# Patient Record
Sex: Male | Born: 1935 | Race: White | Hispanic: No | Marital: Single | Smoking: Former smoker
Health system: Southern US, Community
[De-identification: ages and names within clinical notes are randomized; demographics above are authoritative.]

## PROBLEM LIST (undated history)

## (undated) DIAGNOSIS — E039 Hypothyroidism, unspecified: Secondary | ICD-10-CM

## (undated) DIAGNOSIS — I2510 Atherosclerotic heart disease of native coronary artery without angina pectoris: Secondary | ICD-10-CM

## (undated) DIAGNOSIS — I10 Essential (primary) hypertension: Secondary | ICD-10-CM

## (undated) DIAGNOSIS — IMO0002 Reserved for concepts with insufficient information to code with codable children: Secondary | ICD-10-CM

## (undated) DIAGNOSIS — I4891 Unspecified atrial fibrillation: Secondary | ICD-10-CM

## (undated) DIAGNOSIS — Z955 Presence of coronary angioplasty implant and graft: Secondary | ICD-10-CM

## (undated) DIAGNOSIS — I495 Sick sinus syndrome: Secondary | ICD-10-CM

## (undated) DIAGNOSIS — E785 Hyperlipidemia, unspecified: Secondary | ICD-10-CM

## (undated) HISTORY — DX: Unspecified atrial fibrillation: I48.91

## (undated) HISTORY — DX: Sick sinus syndrome: I49.5

## (undated) HISTORY — DX: Hyperlipidemia, unspecified: E78.5

---

## 1996-09-10 DIAGNOSIS — Z955 Presence of coronary angioplasty implant and graft: Secondary | ICD-10-CM

## 1996-09-10 HISTORY — DX: Presence of coronary angioplasty implant and graft: Z95.5

## 1996-12-30 HISTORY — PX: CORONARY ANGIOPLASTY WITH STENT PLACEMENT: SHX49

## 1997-12-13 ENCOUNTER — Ambulatory Visit (HOSPITAL_COMMUNITY): Admission: RE | Admit: 1997-12-13 | Discharge: 1997-12-13 | Payer: Self-pay | Admitting: Cardiology

## 1998-02-16 ENCOUNTER — Ambulatory Visit (HOSPITAL_COMMUNITY): Admission: RE | Admit: 1998-02-16 | Discharge: 1998-02-16 | Payer: Self-pay | Admitting: Cardiology

## 2000-03-06 ENCOUNTER — Encounter: Payer: Self-pay | Admitting: Cardiology

## 2000-03-06 ENCOUNTER — Encounter: Admission: RE | Admit: 2000-03-06 | Discharge: 2000-03-06 | Payer: Self-pay | Admitting: Cardiology

## 2000-03-07 ENCOUNTER — Ambulatory Visit (HOSPITAL_COMMUNITY): Admission: RE | Admit: 2000-03-07 | Discharge: 2000-03-08 | Payer: Self-pay | Admitting: Cardiology

## 2000-03-07 HISTORY — PX: CARDIAC CATHETERIZATION: SHX172

## 2000-04-11 ENCOUNTER — Ambulatory Visit (HOSPITAL_COMMUNITY): Admission: RE | Admit: 2000-04-11 | Discharge: 2000-04-11 | Payer: Self-pay | Admitting: General Surgery

## 2000-07-24 ENCOUNTER — Encounter: Payer: Self-pay | Admitting: General Surgery

## 2000-07-24 ENCOUNTER — Encounter: Admission: RE | Admit: 2000-07-24 | Discharge: 2000-07-24 | Payer: Self-pay | Admitting: General Surgery

## 2003-10-26 ENCOUNTER — Encounter: Admission: RE | Admit: 2003-10-26 | Discharge: 2003-10-26 | Payer: Self-pay | Admitting: Cardiology

## 2007-03-04 HISTORY — PX: NM MYOVIEW LTD: HXRAD82

## 2007-03-26 ENCOUNTER — Ambulatory Visit (HOSPITAL_COMMUNITY): Admission: RE | Admit: 2007-03-26 | Discharge: 2007-03-27 | Payer: Self-pay | Admitting: Cardiology

## 2007-03-26 HISTORY — PX: CARDIAC CATHETERIZATION: SHX172

## 2009-09-10 HISTORY — PX: CATARACT EXTRACTION, BILATERAL: SHX1313

## 2009-11-10 ENCOUNTER — Emergency Department (HOSPITAL_COMMUNITY)
Admission: EM | Admit: 2009-11-10 | Discharge: 2009-11-10 | Payer: Self-pay | Source: Home / Self Care | Admitting: Emergency Medicine

## 2009-11-14 ENCOUNTER — Telehealth: Payer: Self-pay | Admitting: Physician Assistant

## 2010-09-18 ENCOUNTER — Emergency Department (HOSPITAL_COMMUNITY)
Admission: EM | Admit: 2010-09-18 | Discharge: 2010-09-18 | Payer: Self-pay | Source: Home / Self Care | Admitting: Emergency Medicine

## 2010-09-25 LAB — COMPREHENSIVE METABOLIC PANEL
ALT: 15 U/L (ref 0–53)
AST: 25 U/L (ref 0–37)
Albumin: 3.1 g/dL — ABNORMAL LOW (ref 3.5–5.2)
Alkaline Phosphatase: 66 U/L (ref 39–117)
BUN: 14 mg/dL (ref 6–23)
CO2: 28 mEq/L (ref 19–32)
Calcium: 9.3 mg/dL (ref 8.4–10.5)
Chloride: 98 mEq/L (ref 96–112)
Creatinine, Ser: 1.04 mg/dL (ref 0.4–1.5)
GFR calc Af Amer: >60 mL/min (ref >60)
GFR calc non Af Amer: >60 mL/min (ref >60)
Glucose, Bld: 95 mg/dL (ref 70–99)
Potassium: 4.2 mEq/L (ref 3.5–5.1)
Sodium: 135 mEq/L (ref 135–145)
Total Bilirubin: 0.5 mg/dL (ref 0.3–1.2)
Total Protein: 6.3 g/dL (ref 6.0–8.3)

## 2010-09-25 LAB — DIFFERENTIAL
Basophils Absolute: 0 10*3/uL (ref 0.0–0.1)
Basophils Relative: 0 % (ref 0–1)
Eosinophils Absolute: 0.1 10*3/uL (ref 0.0–0.7)
Eosinophils Relative: 1 % (ref 0–5)
Lymphocytes Relative: 12 % (ref 12–46)
Lymphs Abs: 1 10*3/uL (ref 0.7–4.0)
Monocytes Absolute: 0.6 10*3/uL (ref 0.1–1.0)
Monocytes Relative: 7 % (ref 3–12)
Neutro Abs: 6.6 10*3/uL (ref 1.7–7.7)
Neutrophils Relative %: 80 % — ABNORMAL HIGH (ref 43–77)

## 2010-09-25 LAB — CBC
HCT: 33.6 % — ABNORMAL LOW (ref 39.0–52.0)
Hemoglobin: 11 g/dL — ABNORMAL LOW (ref 13.0–17.0)
MCH: 30.8 pg (ref 26.0–34.0)
MCHC: 32.7 g/dL (ref 30.0–36.0)
MCV: 94.1 fL (ref 78.0–100.0)
Platelets: 436 10*3/uL — ABNORMAL HIGH (ref 150–400)
RBC: 3.57 MIL/uL — ABNORMAL LOW (ref 4.22–5.81)
RDW: 14.2 % (ref 11.5–15.5)
WBC: 8.3 10*3/uL (ref 4.0–10.5)

## 2010-09-25 LAB — GLUCOSE, CAPILLARY: Glucose-Capillary: 82 mg/dL (ref 70–99)

## 2010-09-25 LAB — OCCULT BLOOD, POC DEVICE: Fecal Occult Bld: POSITIVE

## 2010-09-25 LAB — LIPASE, BLOOD: Lipase: 42 U/L (ref 11–59)

## 2010-09-25 LAB — SAMPLE TO BLOOD BANK

## 2010-09-25 LAB — PROTIME-INR
INR: 1.01 (ref 0.00–1.49)
Prothrombin Time: 13.5 seconds (ref 11.6–15.2)

## 2010-09-25 LAB — LACTIC ACID, PLASMA: Lactic Acid, Venous: 0.7 mmol/L (ref 0.5–2.2)

## 2010-09-25 LAB — APTT: aPTT: 27 seconds (ref 24–37)

## 2010-10-10 NOTE — Progress Notes (Signed)
  Phone Note Outgoing Call   Call placed by: Joselyn Glassman,  November 14, 2009 12:23 PM Call placed to: Patient Summary of Call: I called the pt to ask why he didn't come to his appt this AM with our PA.  He said he went to [facility] hospital last week and "they fixed me up".  He said he had a touch of the flu.  I advised we got records from Dr. Lanell Matar office including a copy of the CT scan and Dr. Jacky Kindle thought he should see [place].  He said, " I  had multipled appts and a CT scan and a hospital stay and I am not spending any more damn money".  He thanked me for calling him and hung up.  Initial call taken by: Joselyn Glassman,  November 14, 2009 12:26 PM

## 2010-12-01 LAB — DIFFERENTIAL
Basophils Absolute: 0 10*3/uL (ref 0.0–0.1)
Basophils Relative: 0 % (ref 0–1)
Eosinophils Absolute: 0 10*3/uL (ref 0.0–0.7)
Eosinophils Relative: 0 % (ref 0–5)
Lymphocytes Relative: 5 % — ABNORMAL LOW (ref 12–46)
Lymphs Abs: 0.5 10*3/uL — ABNORMAL LOW (ref 0.7–4.0)
Monocytes Absolute: 0.6 10*3/uL (ref 0.1–1.0)
Monocytes Relative: 6 % (ref 3–12)
Neutro Abs: 8.7 10*3/uL — ABNORMAL HIGH (ref 1.7–7.7)
Neutrophils Relative %: 89 % — ABNORMAL HIGH (ref 43–77)

## 2010-12-01 LAB — COMPREHENSIVE METABOLIC PANEL
ALT: 13 U/L (ref 0–53)
AST: 22 U/L (ref 0–37)
Albumin: 3.7 g/dL (ref 3.5–5.2)
Alkaline Phosphatase: 65 U/L (ref 39–117)
BUN: 35 mg/dL — ABNORMAL HIGH (ref 6–23)
CO2: 30 mEq/L (ref 19–32)
Calcium: 9.4 mg/dL (ref 8.4–10.5)
Chloride: 96 mEq/L (ref 96–112)
Creatinine, Ser: 1.09 mg/dL (ref 0.4–1.5)
GFR calc Af Amer: >60 mL/min (ref >60)
GFR calc non Af Amer: >60 mL/min (ref >60)
Glucose, Bld: 118 mg/dL — ABNORMAL HIGH (ref 70–99)
Potassium: 4 mEq/L (ref 3.5–5.1)
Sodium: 143 mEq/L (ref 135–145)
Total Bilirubin: 0.6 mg/dL (ref 0.3–1.2)
Total Protein: 6.8 g/dL (ref 6.0–8.3)

## 2010-12-01 LAB — CBC
HCT: 34 % — ABNORMAL LOW (ref 39.0–52.0)
Hemoglobin: 11.5 g/dL — ABNORMAL LOW (ref 13.0–17.0)
MCHC: 33.7 g/dL (ref 30.0–36.0)
MCV: 96.2 fL (ref 78.0–100.0)
Platelets: 339 10*3/uL (ref 150–400)
RBC: 3.53 MIL/uL — ABNORMAL LOW (ref 4.22–5.81)
RDW: 13.9 % (ref 11.5–15.5)
WBC: 9.8 10*3/uL (ref 4.0–10.5)

## 2010-12-01 LAB — LIPASE, BLOOD: Lipase: 37 U/L (ref 11–59)

## 2011-01-23 NOTE — Discharge Summary (Signed)
NAME:  FIELDS, OROS NO.:  [id number]   MEDICAL RECORD NO.:  [id number]          PATIENT TYPE:  OIB   LOCATION:  4703                         FACILITY:  [facility]   PHYSICIAN:  Thereasa Solo. Little, M.D. DATE OF BIRTH:  19-Mar-1921   DATE OF ADMISSION:  03/26/2007  DATE OF DISCHARGE:  03/27/2007                               DISCHARGE SUMMARY   DISCHARGE DIAGNOSES:  1. Coronary disease, plan is for medical therapy after catheterization      this admission.  2. Abnormal Cardiolite study March 04, 2007.  3. Treated dyslipidemia.  4. Treated hypertension.   HOSPITAL COURSE:  The patient is an 77 year old male who has known  coronary disease.  He had a stent to his RCA in 1998.  He had a  Cardiolite study recently as an outpatient that showed mild to moderate  inferolateral ischemia.  He did have an episode of syncope prior to  admission.  Dr. Clarene Duke saw him in the office March 12, 2007, and set him  up for a cardiac catheterization.  Catheterization was done March 26, 2007.  This revealed normal left main, total circumflex with left-to-  left collaterals.  The OM-2 and OM three, 50% LAD and patent stent in  the RCA with normal LV function.  There was some calcification in the  distal aorta with no aneurysm.  The patient was kept overnight for  observation. He does live alone.  Dr. Clarene Duke discussed options with the  patient.  Dr. Clarene Duke did not feel that there was anything that could be  safely treated with a PCI, and the patient did not want bypass surgery.  For now, we will continue his current medications.  Dr. 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   DISCHARGE MEDICATIONS:  1. Lipitor 40 mg a day.  2. Zetia 10 mg a day.  3. Metoprolol 25 mg a day.  4. Enalapril 2.5 mg a day.  5. Aspirin 325 mg a day.  6. Nitroglycerin sublingual p.r.n.   LABORATORY DATA:  White count 6.6, hemoglobin 13, hematocrit  38.8,  platelets 300.  Sodium 137, potassium 4.3, BUN 11, creatinine 0.9.  Chest x-ray shows no acute findings.  Telemetry shows sinus rhythm,  sinus bradycardia 50 with a right bundle branch block.   DISPOSITION:  The patient is discharged stable condition.  He will  follow up with Dr. Clarene Duke as an outpatient in 4-5 weeks.      Abelino Derrick, P.A.    ______________________________  Thereasa Solo. Little, M.D.    Lenard Lance  D:  03/27/2007  T:  03/28/2007  Job:  [id number]   cc:   Thereasa Solo. Little, M.D.  Geoffry Paradise, M.D.

## 2011-01-23 NOTE — Cardiovascular Report (Signed)
NAME:  Devin Clark, Devin Clark NO.:  [id number]   MEDICAL RECORD NO.:  [id number]          PATIENT TYPE:  OIB   LOCATION:  4703                         FACILITY:  [facility]   PHYSICIAN:  Thereasa Solo. Little, M.D. DATE OF BIRTH:  10-02-1920   DATE OF PROCEDURE:  03/26/2007  DATE OF DISCHARGE:                            CARDIAC CATHETERIZATION   INDICATIONS:  This 77 year old male had a stent placed to his RCA in  1998.  He has had no complaints of exertional chest pain, but does have  chronic, right shoulder musculoskeletal pain.  He has had abnormal  nuclear studies now for several years showing anterolateral ischemia,  but has a known occluded circumflex.  He recently had a syncopal episode  while trying to do abdominal crunches with a 20 pound weight on his  chest.  He is brought in for outpatient cardiac catheterization because  of the syncopal episodes, but also because of the abnormal nuclear study  to make sure he does not have ischemic mediated issues.   After obtaining informed consent, the patient was prepped and draped in  the usual sterile fashion exposing the right groin.  Applying local  anesthetic with 1% Xylocaine, the Seldinger technique was employed and a  5-French introducer sheath was placed in the right femoral artery.  Left  and right coronary arteriography and ventriculography in the RAO  projection and a distal aortogram was performed.   COMPLICATIONS:  None.   EQUIPMENT:  5-French Judkins configuration catheters.   TOTAL CONTRAST USED:  115 mL.   HEMODYNAMIC DATA:  Central aortic pressure was 146/66.  Left ventricular  pressure was 149/8.  There was no significant valve gradient at the time  of pullback.   Ventriculography:  Ventriculography in the RAO projection was performed.  There was actually two ventriculograms performed.  With the first one,  there was too much ventricular ectopy.  The LV function appeared to be  normal with an ejection  fraction around 50% with an end-diastolic  pressure of 19.   Distal aortogram:  A distal aortogram was performed because of marked  calcification of distal aorta.  Despite the calcification, there was no  evidence of abdominal aortic aneurysm or iliac disease and there was no  renal artery stenosis.   Coronary arteriography:  On fluoroscopy, disc calcification was seen in  the distal left main and proximal LAD.  In addition to this, there was a  stent noted in the distribution of the RCA.   Left main normal, it bifurcated.   Circumflex.  The circumflex gave rise to an OM vessel that was free of  disease.  Right after the OM vessel, the circumflex was 100% occluded,  but there was late, left-to-left collateral filling of OM-2 and OM-3 and  this has not changed from his previous catheterizations.   LAD.  The LAD had some ostial hypolucency and about 20% narrowing.  There was in addition to this after the first septal perforator, an area  of 40-50% narrowing.  The mid and distal vessel was free of disease and  the LAD actually across the apex  of the heart.  There was 50% narrowing  of the ostium of the first diagonal.   Right coronary artery.  There was a stent in the proximal portion of the  RCA that was widely patent.  The PDA and a very large posterolateral  branch was also noted and was free of disease.   CONCLUSION:  1. Normal LV systolic function.  2. Widely patent stent in the RCA (now 76 years old).  Total occlusion      of the mid circumflex with large, left-to-left collateral beds and      this is unchanged from his last catheterization in 2001.  3. Proximal LAD disease and ostial hyperlucency.   I do not feel the LAD is amenable to any type of percutaneous  intervention.  In addition to this, I am not sure that this is actually  significant enough to warrant any type of intervention which would be  surgery.  With him relatively asymptomatic and 77 years of age, I have   opted to continue him on medical therapy.   He lives alone and I plan to keep in the hospital overnight and monitor  him.  Will continue him on the same medications.           ______________________________  Thereasa Solo Little, M.D.     ABL/MEDQ  D:  03/26/2007  T:  03/27/2007  Job:  [id number]   cc:   Geoffry Paradise, M.D.  Cath Lab

## 2011-01-26 NOTE — Cardiovascular Report (Signed)
[place]  Patient:    Devin Clark, Devin Clark                     MRN: [id number] Proc. Date: 03/07/00 Adm. Date:  [id number] Disc. Date: [id number] Attending:  Loreli Dollar CC:         Richard A. Jacky Kindle, M.D.             Thereasa Solo. Little, M.D.                        Cardiac Catheterization  INDICATIONS FOR PROCEDURE:  Mr. Bubeck is a 77 year old male who had a previous inferior myocardial infarction in 1998 with intervention at that time.  He has done well cardiac wise, but then began having recurrent episodes of exertional chest pain and rest pain that sounded anginal in quality. Because of this he was brought in for cardiac catheterization.  PROCEDURES: 1. Left heart catheterization. 2. Selective right and left coronary arteriography. 3. Ventriculography, right anterior oblique projection.  DESCRIPTION OF PROCEDURE:  The patient was prepared and draped in the usual sterile fashion exposing the right groin.  Following local anesthetic with 1% Xylocaine, the Seldinger technique was employed.  A 6 French introducer sheath was placed in the right femoral artery.  Selective right and left coronary arteriography and ventriculography in the RAO projection was performed.  The 6 French Judkins configuration catheters were used.  RESULTS: 1. Hemodynamic monitoring:  Central aortic pressure 127/64, left    ventricular pressure 128/14 with no aortic valve gradient noted at the    time of pullback. 2. Ventriculography:  Ventriculography revealed the posterior basilar and    inferior wall segments to be hypokinetic.  The ejection fraction was    approximately 50% and the end-diastolic pressure was 14.  Calculation    of the ejection fraction, however, was 72% which is wrong.  CORONARY ARTERIOGRAPHY:  There was calcification of the LAD circumflex proximally and the stent in the RCA was seen by fluoroscopy. 1. Left main:  Normal. 2. LAD.  The LAD  extended down to the apex of the heart and was small in    diameter to about 2.5 mm.  There was proximal 30% irregularities and mid    50% irregularities. 3. Circumflex:  The circumflex was 100% occluded proximally and the    marginals were seen to fill retrograde via left to left collaterals. 4. Right coronary artery:  The right coronary artery had mild irregularities    in the midportion.  The stent, however, was widely patent.  CONCLUSIONS: 1. Mild left ventricular dysfunction with inferior and posterior basilar    hypokinesis.  Ejection fraction around 50%. 2. An 100% occlusion of the circumflex with left to left collaterals and    widely patent stent in the right coronary artery.  In review of his catheterization films from previous catheterization, there is no significant change.  The patient will be started on Prevacid.  He lives lone and has no family in the area and he will be kept overnight with discharge in the morning. DD:  03/11/00 TD:  03/11/00 Job: [id number]

## 2011-01-26 NOTE — Op Note (Signed)
[facility]  Patient:    MARKAS, ALDREDGE                     MRN: [id number] Proc. Date: 04/11/00 Adm. Date:  [id number] Disc. Date: [id number] Attending:  Loreli Dollar CC:         Thereasa Solo. Little, M.D.  Richard A. Jacky Kindle, M.D.   Operative Report  PREOPERATIVE DIAGNOSIS:  Right inguinal hernia.  POSTOPERATIVE DIAGNOSIS:  Right direct inguinal hernia.  OPERATIVE PROCEDURE:  Repair of hernia with mesh.  SURGEON:  Timothy E. Earlene Plater, M.D.  ANESTHESIA:  CRNA standby.  INDICATIONS:  The patient is 63, very active, and he has stable cardiac disease with a new enlarging and painful right inguinal hernia, and after careful explanation, he wishes to proceed with repair.  DESCRIPTION OF PROCEDURE:  The patient was brought to the operating room and supine, IV started, and sedation given.  The right groin was then shaved, and was prepped and draped in the usual fashion.  Marcaine 0.5% with epinephrine was used throughout for local anesthesia.  After a wide field block anesthesia was provided, a curvilinear incision was made over the palpable defect and the above subcutaneous tissue dissected and bleeding points cauterized.  The external oblique was identified, dissected free, and open in line with its fibers through the external ring.  Care was taken to spare the ilioinguinal nerve.  The floor of the canal was reasonably intact.  The internal ring was quite large and the distal cord was normal.  The cord was dissected from the floor of the canal and isolated, and retracted laterally.  Dissection of the internal ring was accomplished and a large lipoma was identified, dissected free, and reduced.  The processes vaginalis was identified and there was no indirect sac.  With the lipoma reduced, a large plug was placed there and tied in place with interrupted 2-0 Prolene sutures, and then a patch was placed over the floor of the canal, to and around the  internal ring.  It was sewn in place with running 2-0 Prolene.  This effectively repaired the floor and internal ring, and reinforced the ______.  Otherwise, the anatomy was satisfactory.  The cord was replaced in its usual position.  The external oblique was closed with a running 2-0 Vicryl, deep subcutaneous with 2-0 Vicryl, and the skin with 4-0 Monocryl.  Counts correct.  Steri-Strips and a dry sterile dressing applied.  He tolerated it well and was removed to the recovery room in good condition. Written and verbal instructions were given to him and his son who was staying with him in town, and he will be followed as an outpatient. DD:  04/11/00 TD:  04/12/00 Job: [id number]

## 2011-05-28 ENCOUNTER — Inpatient Hospital Stay (HOSPITAL_COMMUNITY)
Admission: EM | Admit: 2011-05-28 | Discharge: 2011-06-13 | DRG: 326 | Disposition: A | Discharge status: Skilled Nursing Facility | Payer: Medicare Other | Attending: General Surgery | Admitting: General Surgery

## 2011-05-28 ENCOUNTER — Emergency Department (HOSPITAL_COMMUNITY): Payer: Medicare Other

## 2011-05-28 ENCOUNTER — Encounter (HOSPITAL_COMMUNITY): Payer: Self-pay | Admitting: Radiology

## 2011-05-28 DIAGNOSIS — E785 Hyperlipidemia, unspecified: Secondary | ICD-10-CM | POA: Diagnosis present

## 2011-05-28 DIAGNOSIS — Z7982 Long term (current) use of aspirin: Secondary | ICD-10-CM

## 2011-05-28 DIAGNOSIS — R5381 Other malaise: Secondary | ICD-10-CM | POA: Diagnosis not present

## 2011-05-28 DIAGNOSIS — K265 Chronic or unspecified duodenal ulcer with perforation: Principal | ICD-10-CM | POA: Diagnosis present

## 2011-05-28 DIAGNOSIS — Z79899 Other long term (current) drug therapy: Secondary | ICD-10-CM

## 2011-05-28 DIAGNOSIS — I469 Cardiac arrest, cause unspecified: Secondary | ICD-10-CM | POA: Diagnosis not present

## 2011-05-28 DIAGNOSIS — J95811 Postprocedural pneumothorax: Secondary | ICD-10-CM | POA: Diagnosis not present

## 2011-05-28 DIAGNOSIS — I10 Essential (primary) hypertension: Secondary | ICD-10-CM | POA: Diagnosis present

## 2011-05-28 DIAGNOSIS — Y9210 Unspecified residential institution as the place of occurrence of the external cause: Secondary | ICD-10-CM | POA: Diagnosis not present

## 2011-05-28 DIAGNOSIS — Z9861 Coronary angioplasty status: Secondary | ICD-10-CM

## 2011-05-28 DIAGNOSIS — K224 Dyskinesia of esophagus: Secondary | ICD-10-CM | POA: Diagnosis present

## 2011-05-28 DIAGNOSIS — K261 Acute duodenal ulcer with perforation: Secondary | ICD-10-CM

## 2011-05-28 DIAGNOSIS — Z23 Encounter for immunization: Secondary | ICD-10-CM

## 2011-05-28 DIAGNOSIS — Y831 Surgical operation with implant of artificial internal device as the cause of abnormal reaction of the patient, or of later complication, without mention of misadventure at the time of the procedure: Secondary | ICD-10-CM | POA: Diagnosis not present

## 2011-05-28 DIAGNOSIS — M7500 Adhesive capsulitis of unspecified shoulder: Secondary | ICD-10-CM | POA: Diagnosis not present

## 2011-05-28 DIAGNOSIS — I2510 Atherosclerotic heart disease of native coronary artery without angina pectoris: Secondary | ICD-10-CM | POA: Diagnosis present

## 2011-05-28 DIAGNOSIS — I442 Atrioventricular block, complete: Secondary | ICD-10-CM | POA: Diagnosis not present

## 2011-05-28 HISTORY — DX: Presence of coronary angioplasty implant and graft: Z95.5

## 2011-05-28 HISTORY — DX: Atherosclerotic heart disease of native coronary artery without angina pectoris: I25.10

## 2011-05-28 HISTORY — PX: OTHER SURGICAL HISTORY: SHX169

## 2011-05-28 HISTORY — DX: Essential (primary) hypertension: I10

## 2011-05-28 LAB — HEPATIC FUNCTION PANEL
ALT: 16 U/L (ref 0–53)
AST: 33 U/L (ref 0–37)
Albumin: 3.6 g/dL (ref 3.5–5.2)
Alkaline Phosphatase: 70 U/L (ref 39–117)
Bilirubin, Direct: 0.1 mg/dL (ref 0.0–0.3)
Indirect Bilirubin: 0.6 mg/dL (ref 0.3–0.9)
Total Bilirubin: 0.7 mg/dL (ref 0.3–1.2)
Total Protein: 6.9 g/dL (ref 6.0–8.3)

## 2011-05-28 LAB — CBC
HCT: 40.3 % (ref 39.0–52.0)
Hemoglobin: 14.5 g/dL (ref 13.0–17.0)
MCH: 31.8 pg (ref 26.0–34.0)
MCHC: 36 g/dL (ref 30.0–36.0)
MCV: 88.4 fL (ref 78.0–100.0)
Platelets: 349 10*3/uL (ref 150–400)
RBC: 4.56 MIL/uL (ref 4.22–5.81)
RDW: 12.6 % (ref 11.5–15.5)
WBC: 16.2 10*3/uL — ABNORMAL HIGH (ref 4.0–10.5)

## 2011-05-28 LAB — DIFFERENTIAL
Basophils Absolute: 0 10*3/uL (ref 0.0–0.1)
Basophils Relative: 0 % (ref 0–1)
Eosinophils Absolute: 0 10*3/uL (ref 0.0–0.7)
Eosinophils Relative: 0 % (ref 0–5)
Lymphocytes Relative: 4 % — ABNORMAL LOW (ref 12–46)
Lymphs Abs: 0.6 10*3/uL — ABNORMAL LOW (ref 0.7–4.0)
Monocytes Absolute: 0.7 10*3/uL (ref 0.1–1.0)
Monocytes Relative: 4 % (ref 3–12)
Neutro Abs: 14.9 10*3/uL — ABNORMAL HIGH (ref 1.7–7.7)
Neutrophils Relative %: 92 % — ABNORMAL HIGH (ref 43–77)

## 2011-05-28 LAB — MRSA PCR SCREENING: MRSA by PCR: NEGATIVE

## 2011-05-28 LAB — POCT I-STAT, CHEM 8
BUN: 24 mg/dL — ABNORMAL HIGH (ref 6–23)
Calcium, Ion: 1.17 mmol/L (ref 1.12–1.32)
Chloride: 92 mEq/L — ABNORMAL LOW (ref 96–112)
Creatinine, Ser: 1 mg/dL (ref 0.50–1.35)
Glucose, Bld: 217 mg/dL — ABNORMAL HIGH (ref 70–99)
HCT: 48 % (ref 39.0–52.0)
Hemoglobin: 16.3 g/dL (ref 13.0–17.0)
Potassium: 3.7 mEq/L (ref 3.5–5.1)
Sodium: 124 mEq/L — ABNORMAL LOW (ref 135–145)
TCO2: 22 mmol/L (ref 0–100)

## 2011-05-28 LAB — POCT I-STAT TROPONIN I: Troponin i, poc: 0.01 ng/mL (ref 0.00–0.08)

## 2011-05-28 LAB — TYPE AND SCREEN
ABO/RH(D): O POS
Antibody Screen: NEGATIVE

## 2011-05-28 MED ORDER — IOHEXOL 300 MG/ML  SOLN
100.0000 mL | Freq: Once | INTRAMUSCULAR | Status: AC | PRN
  Filled 2011-05-28: qty 100

## 2011-05-29 LAB — CBC
HCT: 31.6 % — ABNORMAL LOW (ref 39.0–52.0)
MCHC: 35.1 g/dL (ref 30.0–36.0)
Platelets: 267 10*3/uL (ref 150–400)
RDW: 13.2 % (ref 11.5–15.5)
WBC: 9.9 10*3/uL (ref 4.0–10.5)

## 2011-05-29 LAB — BASIC METABOLIC PANEL
BUN: 18 mg/dL (ref 6–23)
Chloride: 100 mEq/L (ref 96–112)
Creatinine, Ser: 0.83 mg/dL (ref 0.50–1.35)
GFR calc Af Amer: >60 mL/min (ref >60)
GFR calc non Af Amer: >60 mL/min (ref >60)
Potassium: 3.4 mEq/L — ABNORMAL LOW (ref 3.5–5.1)

## 2011-05-29 LAB — MAGNESIUM: Magnesium: 1.6 mg/dL (ref 1.5–2.5)

## 2011-05-30 LAB — BASIC METABOLIC PANEL
BUN: 16 mg/dL (ref 6–23)
Calcium: 7.8 mg/dL — ABNORMAL LOW (ref 8.4–10.5)
Chloride: 103 mEq/L (ref 96–112)
Creatinine, Ser: 0.97 mg/dL (ref 0.50–1.35)
GFR calc Af Amer: >60 mL/min (ref >60)
GFR calc non Af Amer: >60 mL/min (ref >60)

## 2011-05-30 LAB — GLUCOSE, CAPILLARY
Glucose-Capillary: 75 mg/dL (ref 70–99)
Glucose-Capillary: 72 mg/dL (ref 70–99)
Glucose-Capillary: 84 mg/dL (ref 70–99)
Glucose-Capillary: 91 mg/dL (ref 70–99)
Glucose-Capillary: 146 mg/dL — ABNORMAL HIGH (ref 70–99)
Glucose-Capillary: 75 mg/dL (ref 70–99)
Glucose-Capillary: 90 mg/dL (ref 70–99)
Glucose-Capillary: 92 mg/dL (ref 70–99)
Glucose-Capillary: 94 mg/dL (ref 70–99)
Glucose-Capillary: 95 mg/dL (ref 70–99)
Glucose-Capillary: 92 mg/dL (ref 70–99)
Glucose-Capillary: 87 mg/dL (ref 70–99)

## 2011-05-30 LAB — CBC
HCT: 33.9 % — ABNORMAL LOW (ref 39.0–52.0)
MCHC: 34.5 g/dL (ref 30.0–36.0)
MCV: 91.9 fL (ref 78.0–100.0)
Platelets: 266 10*3/uL (ref 150–400)
RDW: 13.4 % (ref 11.5–15.5)
WBC: 11 10*3/uL — ABNORMAL HIGH (ref 4.0–10.5)

## 2011-05-30 LAB — MAGNESIUM: Magnesium: 2 mg/dL (ref 1.5–2.5)

## 2011-05-31 LAB — GLUCOSE, CAPILLARY: Glucose-Capillary: 84 mg/dL (ref 70–99)

## 2011-06-01 ENCOUNTER — Inpatient Hospital Stay (HOSPITAL_COMMUNITY): Payer: Medicare Other

## 2011-06-01 LAB — CBC
MCH: 31.4 pg (ref 26.0–34.0)
MCHC: 34.7 g/dL (ref 30.0–36.0)
Platelets: 282 10*3/uL (ref 150–400)
RBC: 3.18 MIL/uL — ABNORMAL LOW (ref 4.22–5.81)
RDW: 13.2 % (ref 11.5–15.5)

## 2011-06-01 LAB — GLUCOSE, CAPILLARY
Glucose-Capillary: 66 mg/dL — ABNORMAL LOW (ref 70–99)
Glucose-Capillary: 120 mg/dL — ABNORMAL HIGH (ref 70–99)

## 2011-06-01 LAB — DIFFERENTIAL
Basophils Relative: 0 % (ref 0–1)
Eosinophils Absolute: 0.3 10*3/uL (ref 0.0–0.7)
Eosinophils Relative: 4 % (ref 0–5)
Monocytes Relative: 10 % (ref 3–12)
Neutrophils Relative %: 76 % (ref 43–77)

## 2011-06-01 LAB — BASIC METABOLIC PANEL
CO2: 21 mEq/L (ref 19–32)
Calcium: 7.9 mg/dL — ABNORMAL LOW (ref 8.4–10.5)
Creatinine, Ser: 0.76 mg/dL (ref 0.50–1.35)
GFR calc Af Amer: >60 mL/min (ref >60)
GFR calc non Af Amer: >60 mL/min (ref >60)
Sodium: 135 mEq/L (ref 135–145)

## 2011-06-01 NOTE — Op Note (Signed)
  NAME:  Devin Clark, Devin Clark NO.:  [id number]  MEDICAL RECORD NO.:  [id number]  LOCATION:  2306                         FACILITY:  [facility]  PHYSICIAN:  Ollen Gross. Vernell Morgans, M.D. DATE OF BIRTH:  July 02, 1921  DATE OF PROCEDURE:  05/28/2011 DATE OF DISCHARGE:                              OPERATIVE REPORT   PREOPERATIVE DIAGNOSIS:  Perforated intestine.  POSTOPERATIVE DIAGNOSIS:  Perforated duodenal ulcer.  PROCEDURE:  Exploratory laparotomy and Graham patch repair of perforated duodenal ulcer.  SURGEON:  Ollen Gross. Vernell Morgans, MD  ASSISTANT:  Dr. Janee Morn.  ANESTHESIA:  General endotracheal.  PROCEDURE:  After informed consent was obtained, the patient was brought to the operating room and placed in supine position on the operating table.  After adequate induction of general anesthesia, the patient's abdomen was prepped with ChloraPrep and allowed to dry and draped in usual sterile manner.  An upper midline incision was made with a 10 blade knife.  This incision was carried down through the skin and subcutaneous tissue sharply with the electrocautery until the linea alba was identified.  The linea alba was incised with the electrocautery. The preperitoneal space was probed bluntly with a hemostat until the peritoneum was opened and access was gained to the abdominal cavity. The rest of the incision was opened under direct vision.  There was a moderate amount of fluid free in the abdomen, which was evacuated.  The lower abdomen appeared clear.  There was an inflammatory focus around the stomach and duodenum.  On further inspection, we were able to identify a small perforation in the proximal duodenum.  Of note, the patient also had some soft abnormalities of the liver.  I think his preoperative CT showed some simple cysts and these lesions were soft but they had some strange looking appearance on their surface.  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  The perforation of the duodenum was repaired with interrupted 2-0 silk stitches.  The tongue of omentum was freed up and then placed over the repair.  It was anchored in place with several 2-0 silk stitches.  Once this was accomplished, the repair appeared to be good. Abdomen was irrigated with copious amounts of saline.  The fascia of the anterior abdominal bowel wall was closed with two #1 double-stranded loop PDS sutures.  Subcutaneous tissue was irrigated with copious amounts of saline.  The skin was closed with staples.  Sterile dressings were applied.  The patient tolerated the procedure well.  At the end of the case all needle, sponge and instrument counts were correct.  The patient was then awakened and taken to recovery room in stable condition.     Ollen Gross. Vernell Morgans, M.D.     PST/MEDQ  D:  05/28/2011  T:  05/28/2011  Job:  [id number]  Electronically Signed by Chevis Pretty III M.D. on 06/01/2011 02:09:08 PM

## 2011-06-01 NOTE — H&P (Signed)
NAME:  Devin Clark, Devin Clark NO.:  [id number]  MEDICAL RECORD NO.:  [id number]  LOCATION:  2550                         FACILITY:  [facility]  PHYSICIAN:  Ollen Gross. Vernell Morgans, M.D. DATE OF BIRTH:  [birth year]  DATE OF ADMISSION:  05/28/2011 DATE OF DISCHARGE:                             HISTORY & PHYSICAL   PRIMARY CARE PROVIDER:  Thereasa Solo. Little, MD  CHIEF COMPLAINT:  Abdominal pain.  HISTORY OF PRESENT ILLNESS:  Devin Clark is an 77 year old gentleman who has a history of coronary artery disease status post stent angioplasty years ago as well as hypertension.  He has otherwise been very well- controlled.  He reports over the last couple of days, he has noticed increased abdominal discomfort and hiccuping eventually leading to nausea and vomiting.  He has not eaten anything but has tried to drink some fluids over the past 24 hours.  He denies any fever, chills, or body aches.  He report some of the pain radiates around through his back.  His bowel movements otherwise have been normal up to this point. He denies any hematochezia or melena.  Denies any hematemesis with his vomiting.  He presented to the emergency department where a workup has been performed and found evidence of free air by CT and by x-ray.  This was suspicious for an upper GI source given his symptoms.  He does have a history of taking aspirin on a daily basis but denies any NSAID use. He denies any alcohol or tobacco use.  PAST MEDICAL HISTORY:  Significant for coronary artery disease and hypertension.  PAST SURGICAL HISTORY:  Percutaneous transluminal coronary angioplasty in 1998.  FAMILY HISTORY:  Noncontributory at the present case.  SOCIAL HISTORY:  The patient has a remote tobacco history, otherwise no current alcohol, tobacco, or illicit drug use.  He is a long-time retired.  ALLERGIES:  No known drug or latex allergies.  MEDICATIONS: 1. Simvastatin 40 mg daily. 2. Metoprolol 50 mg 1/2  tablet twice daily. 3. Enalapril 5 mg 1/2 tablet twice daily. 4. Aspirin 325 mg daily. 5. Multivitamins.  REVIEW OF SYSTEMS:  Please see history of illness for pertinent findings.  PHYSICAL EXAMINATION:  VITAL SIGNS:  This is an 77 year old gentleman with a temperature of 97.7, blood pressure 108/72, heart rate of approximately 100, respiratory rate of 20. GENERAL:  The patient appears to be resting, though he is in some distress. ENT:  Unremarkable. NECK:  Supple without lymphadenopathy.  Trachea is midline.  No thyromegaly or masses. LUNGS:  Clear to auscultation.  No wheezes, rhonchi, or rales. HEART:  Regular rate and rhythm.  No murmurs, gallops, or rubs. ABDOMEN:  Mildly firm but not rigid.  He is diffusely tender with evidence of peritonitis.  No surgical scars, masses, or hernias are appreciated. EXTREMITIES:  Diminished range of motion of all extremities, however, no crepitus or pain. SKIN:  Otherwise warm and dry with good turgor.  No rashes, lesions, or nodules. NEUROLOGIC:  The patient is alert and oriented x3 and gives an appropriate history with a normal affect.  PERTINENT DIAGNOSTIC DATA:  White blood cell count of 16,000, hemoglobin of 14.5, hematocrit of 40.3, and platelet count of  349.  Metabolic panel shows a sodium of 124, potassium of 3.7, chloride of 92, BUN of 24, glucose of 217, and creatinine of 1.0.  IMAGING:  CT scan shows upper abdominal pneumoperitoneum suspicious for gastric or duodenal sores though minimal inflammatory or free fluid is seen.  The patient does have diverticular changes of the descending colon but no evidence of inflammation or perforation there.  IMPRESSION:  Acute abdomen secondary to perforated viscus.  PLAN:  We will admit the patient for acute and emergent surgical need. I have discussed with the patient and one of his sons via phone the need for urgent surgical exploration.  Risks, complications, and postoperative  expectations were explained to the patient and his son in detail.     Brayton El, PA-C   ______________________________ Ollen Gross. Vernell Morgans, M.D.    KB/MEDQ  D:  05/28/2011  T:  05/28/2011  Job:  [id number]  Electronically Signed by Brayton El  on 05/30/2011 03:28:59 PM Electronically Signed by Chevis Pretty III M.D. on 06/01/2011 02:08:55 PM

## 2011-06-02 ENCOUNTER — Inpatient Hospital Stay (HOSPITAL_COMMUNITY): Payer: Medicare Other

## 2011-06-02 LAB — GLUCOSE, CAPILLARY
Glucose-Capillary: 122 mg/dL — ABNORMAL HIGH (ref 70–99)
Glucose-Capillary: 158 mg/dL — ABNORMAL HIGH (ref 70–99)

## 2011-06-02 LAB — BASIC METABOLIC PANEL
CO2: 23 mEq/L (ref 19–32)
Calcium: 8.1 mg/dL — ABNORMAL LOW (ref 8.4–10.5)
GFR calc non Af Amer: >60 mL/min (ref >60)
Glucose, Bld: 145 mg/dL — ABNORMAL HIGH (ref 70–99)
Potassium: 3.5 mEq/L (ref 3.5–5.1)
Sodium: 133 mEq/L — ABNORMAL LOW (ref 135–145)

## 2011-06-02 LAB — MAGNESIUM: Magnesium: 2 mg/dL (ref 1.5–2.5)

## 2011-06-02 LAB — PHOSPHORUS: Phosphorus: 1.6 mg/dL — ABNORMAL LOW (ref 2.3–4.6)

## 2011-06-02 MED ORDER — IOHEXOL 300 MG/ML  SOLN: 150.0000 mL | Freq: Once | INTRAMUSCULAR | Status: AC | PRN

## 2011-06-03 DIAGNOSIS — I499 Cardiac arrhythmia, unspecified: Secondary | ICD-10-CM

## 2011-06-03 LAB — GLUCOSE, CAPILLARY
Glucose-Capillary: 149 mg/dL — ABNORMAL HIGH (ref 70–99)
Glucose-Capillary: 144 mg/dL — ABNORMAL HIGH (ref 70–99)
Glucose-Capillary: 156 mg/dL — ABNORMAL HIGH (ref 70–99)

## 2011-06-03 LAB — BASIC METABOLIC PANEL
CO2: 24 mEq/L (ref 19–32)
Chloride: 108 mEq/L (ref 96–112)
Potassium: 3.6 mEq/L (ref 3.5–5.1)
Sodium: 136 mEq/L (ref 135–145)

## 2011-06-03 LAB — TSH: TSH: 6.83 u[IU]/mL — ABNORMAL HIGH (ref 0.350–4.500)

## 2011-06-03 LAB — CBC
Platelets: 323 10*3/uL (ref 150–400)
RBC: 3.46 MIL/uL — ABNORMAL LOW (ref 4.22–5.81)
WBC: 5.7 10*3/uL (ref 4.0–10.5)

## 2011-06-03 LAB — PHOSPHORUS: Phosphorus: 2.7 mg/dL (ref 2.3–4.6)

## 2011-06-03 LAB — MAGNESIUM: Magnesium: 1.9 mg/dL (ref 1.5–2.5)

## 2011-06-03 NOTE — Consult Note (Signed)
NAME:  GILDO, CRISCO.:  [id number]  MEDICAL RECORD NO.:  [id number]  LOCATION:  2306                         FACILITY:  [facility]  PHYSICIAN:  [place] Timothy Townsel, MD         DATE OF BIRTH:  03-30-1921  DATE OF CONSULTATION:  06/02/2011 DATE OF DISCHARGE:                                CONSULTATION   HISTORY OF PRESENT ILLNESS:  Asked by [facility] to see an 77 year old white male secondary to asystole x12 seconds.  The patient was admitted on May 28, 2011 secondary to abdominal discomfort, hiccupping, and progressing to nausea and vomiting.  He had free air by CT and x-ray, acute abdomen secondary to perforated viscus, went to the OR on the 17th for exploratory lap and Graham patch repair of perforated duodenal ulcer.  The patient has done well postop until this morning June 02, 2011 at 7:48 a.m., had 12 seconds of asystole.  P-waves were present with ventricular asystole.  The patient states he was reaching for a cup, he fell back in the bed, and then the nurses were there said most likely syncope.  History of coronary artery disease with stent to the RCA in 1998.  Since then, he has done reasonably well.  His last cath was in 2008.  At that time, the stent to the RCA was patent.  He had 100% left circumflex distally which is old with left-to-left collaterals filling the OM-2 and OM-3.  His LAD had nonobstructive disease with up to 40%-50% narrowing of the first diagonal, normal LV function.  The patient sees Dr. Clarene Duke once a year and he has been stable.  Prior to his cath in 2008, he did have an episode of syncope while trying to do abdominal crunches with 20 pound weight on his chest, but none since. Prior to admission, no chest pain, no shortness of breath.  Other history, treated dyslipidemia and treated hypertension.  Today, also complains of right shoulder discomfort with moving.  SOCIAL HISTORY:  Single with remote tobacco history.   Otherwise, no current alcohol, tobacco, or illicit drugs.  He is retired for a long period of time, but he is very active.  He does exercises most of the day at intervals, starting from stretches to weights and treadmill. Social history is not significant to this admission or this asystole.  ALLERGIES:  No known allergies.  OUTPATIENT MEDICATIONS: 1. Simvastatin 40 mg daily. 2. Metoprolol 25 mg twice a day. 3. Enalapril 2.5 mg twice a day. 4. Aspirin 325 daily. 5. Multivitamin daily.  REVIEW OF SYSTEMS:  GENERAL:  States he was in good health, no problems until he developed abdominal pain.  SKIN:  Without rashes.  HEENT:  No blurred vision.  CARDIOVASCULAR:  No chest pain.  PULMONARY:  No shortness of breath.  GI:  See HPI.  GU:  Negative.  NEURO:  No syncope until today.  PHYSICAL EXAMINATION:  VITAL SIGNS:  Today, blood pressure 128/81, pulse 69, respirations 15-22, temperature 98.4, oxygen saturation on room air 97%. GENERAL:  Alert, oriented white male, in no acute distress, sitting in chair comfortably currently. SKIN:  Warm and dry.  Brisk capillary refill. HEENT:  Normocephalic.  Sclerae clear. NECK:  Supple.  No JVD.  No bruits. HEART:  S1-S2.  Regular rate and rhythm. LUNGS:  Clear without rales, rhonchi, or wheezes. ABDOMEN:  Soft, hypoactive bowel sounds. LOWER EXTREMITIES:  Without edema at all. NEURO:  Alert and oriented x3.  Moves all extremities.  Follows commands.  Today's labs are pending.  EKG with trifascicular block.  Phosphorus is somewhat low at 1.6.  Magnesium 2.0.  Sodium 133, potassium 3.5, chloride 101, CO2 25, glucose 145, BUN 8, creatinine 0.70, calcium 8.1. Hemoglobin yesterday was 10 with hematocrit of 28.8, WBC 7.3, and platelets 282.  There is a drop in phosphorus yesterday with 2.8.  IMPRESSION:  1. Complete heart block, symptomatic 2. Presyncope secondary to heart block  PLAN:  We will check a 2-D echo.  He is on IV Lopressor 5 mg q.6  h.  We will hold that and recommend hydralazine IV for hypertension.  He will need to be monitored.  Dr. Rennis Golden has seen him and feels a permanent pacemaker will be necessary in the near future.     Darcella Gasman. Annie Paras, N.P.   ______________________________ [place] Cleotis Sparr, MD    LRI/MEDQ  D:  06/02/2011  T:  06/02/2011  Job:  [id number]  cc:   Thereasa Solo. Little, M.D.  Electronically Signed by Nada Boozer N.P. on 06/03/2011 11:54:37 AM Electronically Signed by Kirtland Bouchard. Montrice Gracey M.D. on 06/03/2011 03:15:27 PM

## 2011-06-04 LAB — GLUCOSE, CAPILLARY: Glucose-Capillary: 141 mg/dL — ABNORMAL HIGH (ref 70–99)

## 2011-06-04 LAB — BASIC METABOLIC PANEL
BUN: 5 mg/dL — ABNORMAL LOW (ref 6–23)
CO2: 20 mEq/L (ref 19–32)
Calcium: 8.5 mg/dL (ref 8.4–10.5)
GFR calc non Af Amer: >60 mL/min (ref >60)
Glucose, Bld: 111 mg/dL — ABNORMAL HIGH (ref 70–99)
Potassium: 3.8 mEq/L (ref 3.5–5.1)

## 2011-06-04 LAB — CBC
HCT: 33.5 % — ABNORMAL LOW (ref 39.0–52.0)
Hemoglobin: 11.5 g/dL — ABNORMAL LOW (ref 13.0–17.0)
MCH: 30.7 pg (ref 26.0–34.0)
MCHC: 34.3 g/dL (ref 30.0–36.0)

## 2011-06-05 ENCOUNTER — Inpatient Hospital Stay (HOSPITAL_COMMUNITY): Payer: Medicare Other

## 2011-06-05 DIAGNOSIS — Z950 Presence of cardiac pacemaker: Secondary | ICD-10-CM | POA: Insufficient documentation

## 2011-06-05 HISTORY — PX: PACEMAKER INSERTION: SHX728

## 2011-06-05 LAB — GLUCOSE, CAPILLARY
Glucose-Capillary: 98 mg/dL (ref 70–99)
Glucose-Capillary: 85 mg/dL (ref 70–99)

## 2011-06-05 LAB — CBC
HCT: 36.6 % — ABNORMAL LOW (ref 39.0–52.0)
Hemoglobin: 12.6 g/dL — ABNORMAL LOW (ref 13.0–17.0)
MCHC: 34.4 g/dL (ref 30.0–36.0)
RBC: 4.09 MIL/uL — ABNORMAL LOW (ref 4.22–5.81)
WBC: 7.7 10*3/uL (ref 4.0–10.5)

## 2011-06-05 LAB — BASIC METABOLIC PANEL
BUN: 9 mg/dL (ref 6–23)
CO2: 20 mEq/L (ref 19–32)
Calcium: 8.6 mg/dL (ref 8.4–10.5)
Creatinine, Ser: 0.8 mg/dL (ref 0.50–1.35)
GFR calc non Af Amer: >60 mL/min (ref >60)
Glucose, Bld: 102 mg/dL — ABNORMAL HIGH (ref 70–99)
Sodium: 134 mEq/L — ABNORMAL LOW (ref 135–145)

## 2011-06-05 LAB — PROTIME-INR: Prothrombin Time: 15 seconds (ref 11.6–15.2)

## 2011-06-06 ENCOUNTER — Inpatient Hospital Stay (HOSPITAL_COMMUNITY): Payer: Medicare Other

## 2011-06-06 DIAGNOSIS — S270XXA Traumatic pneumothorax, initial encounter: Secondary | ICD-10-CM

## 2011-06-06 DIAGNOSIS — J95811 Postprocedural pneumothorax: Secondary | ICD-10-CM | POA: Insufficient documentation

## 2011-06-06 HISTORY — PX: CHEST TUBE INSERTION: SHX231

## 2011-06-07 ENCOUNTER — Inpatient Hospital Stay (HOSPITAL_COMMUNITY): Payer: Medicare Other

## 2011-06-07 LAB — POTASSIUM: Potassium: 4 mEq/L (ref 3.5–5.1)

## 2011-06-08 ENCOUNTER — Inpatient Hospital Stay (HOSPITAL_COMMUNITY): Payer: Medicare Other

## 2011-06-09 ENCOUNTER — Inpatient Hospital Stay (HOSPITAL_COMMUNITY): Payer: Medicare Other

## 2011-06-10 ENCOUNTER — Inpatient Hospital Stay (HOSPITAL_COMMUNITY): Payer: Medicare Other

## 2011-06-10 LAB — CBC
Hemoglobin: 12 g/dL — ABNORMAL LOW (ref 13.0–17.0)
MCH: 31.7 pg (ref 26.0–34.0)
MCHC: 34.7 g/dL (ref 30.0–36.0)
RDW: 13.5 % (ref 11.5–15.5)

## 2011-06-10 LAB — BASIC METABOLIC PANEL
Calcium: 8.7 mg/dL (ref 8.4–10.5)
GFR calc Af Amer: >60 mL/min (ref >60)
GFR calc non Af Amer: >60 mL/min (ref >60)
Glucose, Bld: 88 mg/dL (ref 70–99)
Potassium: 3.4 mEq/L — ABNORMAL LOW (ref 3.5–5.1)
Sodium: 132 mEq/L — ABNORMAL LOW (ref 135–145)

## 2011-06-11 LAB — BASIC METABOLIC PANEL
BUN: 20 mg/dL (ref 6–23)
Calcium: 8.9 mg/dL (ref 8.4–10.5)
GFR calc Af Amer: 88 mL/min — ABNORMAL LOW (ref >90)
GFR calc non Af Amer: 76 mL/min — ABNORMAL LOW (ref >90)
Glucose, Bld: 99 mg/dL (ref 70–99)
Potassium: 4 mEq/L (ref 3.5–5.1)
Sodium: 132 mEq/L — ABNORMAL LOW (ref 135–145)

## 2011-06-12 NOTE — Op Note (Signed)
NAME:  Devin Clark, Devin Clark NO.:  [id number]  MEDICAL RECORD NO.:  [id number]  LOCATION:  2312                         FACILITY:  [facility]  PHYSICIAN:  Thurmon Fair, MD     DATE OF BIRTH:  1921/01/19  DATE OF PROCEDURE:  06/05/2011 DATE OF DISCHARGE:                              OPERATIVE REPORT   PROCEDURES PERFORMED: 1. Implantation of dual-chamber permanent pacemaker. 2. Light sedation. 3. Fluoroscopy. 4. Right upper extremity venogram.  REASON FOR THE PROCEDURE:  Intermittent/paroxysmal third-degree atrioventricular block.  DEVICE DETAILS:  Generator is a Architectural technologist DR RF device, model V2238037, serial [id number].  Atrial lead is [employer], model [phone] TC-46 cm, serial [id number].  Right ventricular lead is a Designer, jewellery, model [id number] TC-53 cm, serial [id number].  MEDICATIONS ADMINISTERED: 1. Ancef 1 g intravenously. 2. Lidocaine 1% 25 mL locally. 3. Versed 1 mg intravenously. 4. Fentanyl 50 mcg intravenously.  After risks and benefits of the procedure were described, the patient provided informed consent.  The patient was prepped and draped in usual sterile fashion.  Local anesthesia with 1% lidocaine was administered to the right infraclavicular area, roughly 3 cm caudal to and parallel with the inferior border of the left clavicle.  Using blunt dissection electrocautery, a prepectoral pocket was carried down to the level of the pectoralis major muscle fascia and carefully inspected for hemostasis.  Antibiotic-soaked sponge was placed in the pocket.  Attempt at accessing the right subclavian vein were repeatedly unsuccessful and the right upper extremity venogram was performed.  With the guidance of the venogram, successful venipuncture was performed with 2 separate sticks and 2 J-tipped guidewires were placed in the left subclavian vein and subsequently exchanged for 7-French safe sheath.  Under fluoroscopic guidance, the  ventricular lead was advanced to the level of the apical-to-mid right ventricular septum.  Several locations were tested due to relatively poor R-wave amplitude.  Finally, a mid septal location was satisfactory, sensing was located.  The active fixation helix was deployed.  There was prominent current of injury and there was no evidence of diaphragmatic stimulation at maximum device output.  The pacing threshold and impedance were good.  The safe sheath was peeled away and the lead was secured in place using 2-0 silk.  In a similar fashion, the atrial lead was advanced to the level of the right atrial appendage and the active fixation helix was deployed. There was prominent current of injury.  There is no evidence of diaphragmatic stimulation at maximum device output.  There were good sensing and pacing parameters noted.  The safe sheath was peeled away and the lead was secured in place using 2-0 silk.  The antibiotics soaked sponge was removed from the pocket and the pocket was flushed with copious amounts of antibiotic solution and reinspected for hemostasis, was found to be excellent.  The generator was then attached to the ventricular lead and subsequent to the atrial lead with appropriate ventricular pacing and AV sequential pacing noted.  The generator was then placed in the pocket with care being taken at the leads, assume a comfortable position that the generator is located superficial to the leads.  The pocket  was closed in 2 layers of 2-0 Vicryl and a layer of cutaneous staples, after which a sterile dressing was applied.  No immediate complications occurred.  Estimated blood loss was less than 10 mL.  The following electronic parameters were encountered at the end of the procedure.  The atrial lead sensed P-waves 2.9 mV, threshold 0.8 volts at 0.5 milliseconds pulse width, impedance 444 ohms, current 1.6 mA.  Right ventricular lead sensed R-waves 9.4 mV, threshold 0.8  volts at 0.5 milliseconds pulse width, impedance of 814 ohms, current 1.4 mg.     Thurmon Fair, MD     MC/MEDQ  D:  06/05/2011  T:  06/05/2011  Job:  [id number]  cc:   [facility] Dr. Juleen China  Electronically Signed by Thurmon Fair M.D. on 06/12/2011 01:19:07 PM

## 2011-06-13 ENCOUNTER — Non-Acute Institutional Stay (INDEPENDENT_AMBULATORY_CARE_PROVIDER_SITE_OTHER): Payer: Medicare Other | Admitting: Family Medicine

## 2011-06-13 ENCOUNTER — Encounter: Payer: Self-pay | Admitting: Family Medicine

## 2011-06-13 DIAGNOSIS — I2510 Atherosclerotic heart disease of native coronary artery without angina pectoris: Secondary | ICD-10-CM

## 2011-06-13 DIAGNOSIS — I10 Essential (primary) hypertension: Secondary | ICD-10-CM | POA: Insufficient documentation

## 2011-06-13 DIAGNOSIS — E785 Hyperlipidemia, unspecified: Secondary | ICD-10-CM

## 2011-06-13 DIAGNOSIS — Z950 Presence of cardiac pacemaker: Secondary | ICD-10-CM

## 2011-06-13 DIAGNOSIS — K265 Chronic or unspecified duodenal ulcer with perforation: Secondary | ICD-10-CM

## 2011-06-13 DIAGNOSIS — J95811 Postprocedural pneumothorax: Secondary | ICD-10-CM

## 2011-06-13 DIAGNOSIS — M7590 Shoulder lesion, unspecified, unspecified shoulder: Secondary | ICD-10-CM | POA: Insufficient documentation

## 2011-06-13 NOTE — Progress Notes (Signed)
  Subjective:    Patient ID: Devin Clark, male    DOB: 01/10/1921, 77 y.o.   MRN: [id number]  HPI and he is admitted for rehabilitation after surgery for perforated duodenal ulcer complicated by a subsequent need for a pacemaker due to complete heart block complicated by iatrogenic right chest pneumothorax which required temporary chest tube. He reports that since having the surgery he's had pain in the right shoulder is gradually getting better. He is always follow a diet high in protein low in carbohydrates and been very physically active using a treadmill 20-25 minutes daily. He denies falls in the last year.        Review of Systems  Constitutional: Positive for fatigue. Negative for appetite change.  Respiratory: Negative for shortness of breath.   Cardiovascular: Negative for chest pain.  Gastrointestinal: Negative for nausea and constipation.  Genitourinary: Negative for dysuria and difficulty urinating.  Musculoskeletal: Negative for gait problem.  Neurological: Positive for weakness. Negative for light-headedness.  Psychiatric/Behavioral: Negative for dysphoric mood.       Objective:   Physical Exam  Constitutional: He is oriented to person, place, and time. He appears well-developed and well-nourished.  HENT:  Head: Normocephalic and atraumatic.  Nose: Nose normal.  Mouth/Throat: Oropharynx is clear and moist. No oropharyngeal exudate.       Bilateral cerumen accumulation, not quite complete obstruction  Eyes: Conjunctivae and EOM are normal. Pupils are equal, round, and reactive to light. Right eye exhibits no discharge. Left eye exhibits discharge.       Fundi visualized, no apparent abnormality  Neck: No thyromegaly present.       Reduced range of motion due to head forward position  Cardiovascular: Normal rate, regular rhythm and intact distal pulses.   No murmur heard. Pulmonary/Chest: Effort normal and breath sounds normal.  Abdominal: Soft. Bowel sounds are  normal. He exhibits no distension and no mass. There is tenderness. There is guarding. There is no rebound.       Bubbly feeling under skin right lower quadrant area  Genitourinary: Penis normal.  Musculoskeletal: He exhibits no edema.       Right shoulder has a painful arc. Is tender in the supraspinatous area but negative impingement sign. He has to move care carefully but can get his hand almost behind his head on the right. The pacemaker insertion site is not tender or red.   Hammertoes on both feet  Lymphadenopathy:    He has no cervical adenopathy.  Neurological: He is alert and oriented to person, place, and time. No cranial nerve deficit. He exhibits normal muscle tone. Coordination abnormal.       He stands using his arms on the bed. And is stable with feet together. Gait is wide-based and mildly unsteady. He moves quickly and staggers a little.   Skin: Skin is warm and dry.       The right lateral lower thorax surgical, right pectoral, epigastric surgical scars appear to be healing normally  Psychiatric:       Registers 3 words recalls one. Oriented x3.  Normal affect. Motivated to increase activity and get back home.           Assessment & Plan:

## 2011-06-13 NOTE — Assessment & Plan Note (Signed)
No dyspnea. Residual subcutaneous emphysema

## 2011-06-13 NOTE — Assessment & Plan Note (Signed)
No current epigastric pain

## 2011-06-13 NOTE — Assessment & Plan Note (Signed)
Probably due to immobility after surgeries including R pectoral pacemaker. Evaluate in PT. Consider steroid injection if not improving.

## 2011-06-14 ENCOUNTER — Other Ambulatory Visit: Payer: Self-pay | Admitting: Family Medicine

## 2011-06-14 MED ORDER — TEARS RENEWED OP SOLN: 1.0000 [drp] | Freq: Three times a day (TID) | OPHTHALMIC | Status: DC | PRN

## 2011-06-14 MED ORDER — OMEGA-3 FATTY ACIDS 1000 MG PO CAPS: 2.0000 g | ORAL_CAPSULE | Freq: Every day | ORAL | Status: DC

## 2011-06-14 MED ORDER — METOPROLOL TARTRATE 12.5 MG HALF TABLET: 12.5000 mg | ORAL_TABLET | Freq: Two times a day (BID) | ORAL | Status: DC

## 2011-06-14 MED ORDER — SIMVASTATIN 40 MG PO TABS: 40.0000 mg | ORAL_TABLET | Freq: Every day | ORAL | Status: DC

## 2011-06-14 MED ORDER — ASPIRIN EC 81 MG PO TBEC: 81.0000 mg | DELAYED_RELEASE_TABLET | Freq: Every day | ORAL | Status: DC

## 2011-06-14 MED ORDER — HYDROCODONE-ACETAMINOPHEN 5-500 MG PO TABS: 1.0000 | ORAL_TABLET | ORAL | Status: DC | PRN

## 2011-06-14 MED ORDER — PANTOPRAZOLE SODIUM 40 MG PO TBEC: 40.0000 mg | DELAYED_RELEASE_TABLET | Freq: Two times a day (BID) | ORAL | Status: DC

## 2011-06-14 MED ORDER — DOCUSATE SODIUM 100 MG PO CAPS: 100.0000 mg | ORAL_CAPSULE | Freq: Two times a day (BID) | ORAL | Status: DC

## 2011-06-14 NOTE — Progress Notes (Signed)
IADL Independent Needs Assistance Dependent  Cooking x    Housework x    Manage Medications x    Manage the telephone x    Shopping for food, clothes, Meds, etc x    Use transportation x    Manage Finances x         Balance Abnormal Patient value  Sitting balance    Arise    Attempts to arise    Immediate standing balance x Small wobble from foot to foot upon standing  Standing balance    Nudge    Eyes closed    360 degree turn    Sitting down     Gait Abnormal Patient value  Initiation of gait    Step length-left    Step length-right    Step height-left    Step height-right    Step symmetry    Step continuity    Path    Trunk    Walking stance

## 2011-06-21 ENCOUNTER — Non-Acute Institutional Stay: Payer: Self-pay | Admitting: Family Medicine

## 2011-06-21 ENCOUNTER — Other Ambulatory Visit: Payer: Self-pay | Admitting: Family Medicine

## 2011-06-21 DIAGNOSIS — M7590 Shoulder lesion, unspecified, unspecified shoulder: Secondary | ICD-10-CM

## 2011-06-21 DIAGNOSIS — Z950 Presence of cardiac pacemaker: Secondary | ICD-10-CM

## 2011-06-21 DIAGNOSIS — I10 Essential (primary) hypertension: Secondary | ICD-10-CM

## 2011-06-21 DIAGNOSIS — K265 Chronic or unspecified duodenal ulcer with perforation: Secondary | ICD-10-CM

## 2011-06-21 MED ORDER — PANTOPRAZOLE SODIUM 40 MG PO TBEC: 40.0000 mg | DELAYED_RELEASE_TABLET | Freq: Two times a day (BID) | ORAL | Status: DC

## 2011-06-21 MED ORDER — METOPROLOL TARTRATE 12.5 MG HALF TABLET: 12.5000 mg | ORAL_TABLET | Freq: Two times a day (BID) | ORAL | Status: DC

## 2011-06-21 MED ORDER — HYDROCODONE-ACETAMINOPHEN 5-500 MG PO TABS
1.0000 | ORAL_TABLET | Freq: Four times a day (QID) | ORAL | Status: DC | PRN
Start: 2011-06-21 — End: 2011-06-21

## 2011-06-21 MED ORDER — HYDROCODONE-ACETAMINOPHEN 5-500 MG PO TABS: 1.0000 | ORAL_TABLET | Freq: Four times a day (QID) | ORAL | Status: DC | PRN

## 2011-06-21 MED ORDER — SIMVASTATIN 40 MG PO TABS: 40.0000 mg | ORAL_TABLET | Freq: Every day | ORAL | Status: DC

## 2011-06-21 MED ORDER — HYDROCODONE-ACETAMINOPHEN 5-500 MG PO TABS: 1.0000 | ORAL_TABLET | ORAL | Status: DC | PRN

## 2011-06-21 MED ORDER — DOCUSATE SODIUM 100 MG PO CAPS: 100.0000 mg | ORAL_CAPSULE | Freq: Two times a day (BID) | ORAL | Status: DC

## 2011-06-21 MED ORDER — OMEGA-3 FATTY ACIDS 1000 MG PO CAPS: 2.0000 g | ORAL_CAPSULE | Freq: Every day | ORAL | Status: AC

## 2011-06-21 NOTE — Patient Instructions (Addendum)
Your appt to see Dr. Sheffield Slider is on 10/25 at 10 am.

## 2011-06-25 LAB — BASIC METABOLIC PANEL
Chloride: 106
GFR calc non Af Amer: >60
Glucose, Bld: 89
Potassium: 4.3
Sodium: 137

## 2011-06-25 LAB — CBC
HCT: 38.8 — ABNORMAL LOW
Hemoglobin: 13
RDW: 13.1

## 2011-06-26 NOTE — Op Note (Signed)
  NAME:  Devin Clark, Devin Clark NO.:  [id number]  MEDICAL RECORD NO.:  [id number]  LOCATION:  2312                         FACILITY:  [facility]  PHYSICIAN:  Cherylynn Ridges, M.D.    DATE OF BIRTH:  July 27, 1921  DATE OF PROCEDURE:  06/06/2011 DATE OF DISCHARGE:                              OPERATIVE REPORT   PREOPERATIVE DIAGNOSIS:  Right greater than 50% pneumothorax, status post pacemaker placement of right side.  POSTOPERATIVE DIAGNOSIS:  Right greater than 50% pneumothorax, status post pacemaker placement of right side.  PROCEDURE:  Placement of right tube thoracostomy, 28-French, Dr. Lindie Spruce, done at bedside.  IV SEDATION:  50 mcg of IV fentanyl and 2 mg of IV Versed.  COMPLICATIONS:  None.  CONDITION:  Stable.  O2 sats were 95-100% throughout procedure.  The patient was breathing spontaneously.  Postprocedure chest x-ray is pending.  PROCEDURE DESCRIPTION:  The procedure was done at the bedside after ChloraPrep preparation of his right chest wall.  We targeted the fourth to fifth intercostal space on the right side at the anterior axillary line.  A trocar 28-French chest tube was placed.  After proper time-out was performed, we gave the patient IV sedation.  We then anesthetized the area of concern using a 28-gauge needle and then subsequently a 22- gauge needle in order to anesthetize the intercostal vessels.  After, we made the skin incision with an #11-blade.  Approximately 1.5-2 cm incision was made in which the operator's tip of the finger was passed. We then bluntly dissected down over the sixth rib into the fifth intercostal space.  We bluntly perforated into the space using the tip of a hemostat clamp with escape of air under some tension.  We enlarged the area using the clamp that was in place and subsequently passed a 28- French chest tube in the proper position.  There was immediate drainage of air on Pleur-Evac connection.  We sutured in place with a  U-stitch of silk.  Once it was in place, we applied a proper dressing.  All counts were correct.  The patient was still allowed to remain asleep.  At the procedure, chest x-ray is pending.     Cherylynn Ridges, M.D.    JOW/MEDQ  D:  06/06/2011  T:  06/06/2011  Job:  [id number]  Electronically Signed by Jimmye Norman M.D. on 06/26/2011 07:29:38 AM

## 2011-06-26 NOTE — Discharge Summary (Signed)
NAME:  Devin Clark, Devin Clark NO.:  [id number]  MEDICAL RECORD NO.:  [id number]  LOCATION:  5511                         FACILITY:  [facility]  PHYSICIAN:  Devin Clark, M.D.DATE OF BIRTH:  07-Jul-1921  DATE OF ADMISSION:  05/28/2011 DATE OF DISCHARGE:  06/12/2011                              DISCHARGE SUMMARY   HISTORY OF PRESENT ILLNESS:  Devin Clark is an 77 year old gentleman with a history of coronary artery disease and stent angioplasty and hypertension, who developed some abdominal discomfort and nausea and vomiting.  He presented to the emergency department where workup found evidence of free air by CAT scan and x-ray.  Given his symptoms of nausea and vomiting and upper abdominal discomfort radiating to his back, this was suspicious for an upper GI source.  Decision was made after evaluation by Dr. Carolynne Clark, and the patient needed to be admitted for urgent surgical intervention.  SUMMARY OF HOSPITAL COURSE:  The patient was admitted on May 28, 2011, was taken to the operating room, underwent exploratory laparotomy and Devin Clark patch repair of perforated duodenal ulcer by Dr. Carolynne Clark.  He tolerated this well and was admitted to the floor in stable condition. However, the patient postoperatively developed several episodes of asystole and had to be consulted on by cardiology.  It was felt that the patient was having complete heart block which was symptomatic. Subsequently, it was eventually felt that the patient would need implantation of a permanent pacemaker, this was performed on June 05, 2011.  The patient tolerated this well; however, an iatrogenic right- sided pneumothorax was encountered.  The patient had to have a right- sided chest tube placed by Dr. Lindie Clark on September 26.  He tolerated this well, and the pneumothorax resolved several days later, and the chest tube was subsequently removed.  He has some residual subcu emphysema which has been stable  since the chest tube has been removed.  The patient has no longer any symptoms regarding chest pain or shortness of breath.  With regards to his abdominal surgery, his staples have been removed, and the patient is tolerating a regular diet and having good bowel function.  However, he did develop some deconditioning and has been working with physical therapy.  The patient essentially has no family in town and it was felt that he will be more appropriate for skilled nursing to facilitate his rehabilitation prior to going home.  Therefore, skilled nursing facilities have been contacted and a bed has been made available for him.  We feel at this point as well as Cardiology that the patient is stable for discharge to skilled nursing facility.  DISCHARGE MEDICATIONS:  The patient's discharge medications will be as follows: 1. Artificial tears 1 drop, both eyes as needed. 2. Aspirin 81 mg daily. 3. Chloraseptic spray as needed. 4. Colace 1 mg 1 tablet twice daily. 5. Vicodin 5/325 one to two tablets q.4 h. p.r.n. pain. 6. Metoprolol 12.5 mg twice daily. 7. Protonix 40 mg twice daily. 8. Fish oil daily. 9,  Multivitamin daily. 1. Simvastatin 40 mg daily. 2. Vitamin B1 daily. 3. Vitamin B12 daily. 4. Vitamin C daily. 5. Vitamin E daily. 6. The patient will no longer take  enalapril or his higher dose of     metoprolol.  DISCHARGE INSTRUCTIONS:  He is given instructions regarding activity which he will continue physical therapy at skilled nursing facility.  He can shower.  He should maintain a heart-healthy regular diet, and he needs to contact our office in approximately 2-3 weeks for followup appoint with Dr. Carolynne Clark, and he needs to follow up  with Devin Clark office neurovascular in approximately 7-10 days for postop pacemaker implantation.     Devin El, PA-C   ______________________________ Devin Clark, M.D.    KB/MEDQ  D:  06/12/2011  T:  06/12/2011  Job:   [id number]  Electronically Signed by Devin Clark M.D. on 06/26/2011 01:27:18 PM

## 2011-07-05 ENCOUNTER — Ambulatory Visit (INDEPENDENT_AMBULATORY_CARE_PROVIDER_SITE_OTHER): Payer: Medicare Other | Admitting: Family Medicine

## 2011-07-05 ENCOUNTER — Encounter: Payer: Self-pay | Admitting: Family Medicine

## 2011-07-05 VITALS — BP 133/78 | HR 102 | Ht 69.0 in | Wt 139.0 lb

## 2011-07-05 DIAGNOSIS — K265 Chronic or unspecified duodenal ulcer with perforation: Secondary | ICD-10-CM

## 2011-07-05 DIAGNOSIS — M719 Bursopathy, unspecified: Secondary | ICD-10-CM

## 2011-07-05 DIAGNOSIS — I2510 Atherosclerotic heart disease of native coronary artery without angina pectoris: Secondary | ICD-10-CM

## 2011-07-05 DIAGNOSIS — H6120 Impacted cerumen, unspecified ear: Secondary | ICD-10-CM

## 2011-07-05 DIAGNOSIS — M7590 Shoulder lesion, unspecified, unspecified shoulder: Secondary | ICD-10-CM

## 2011-07-05 DIAGNOSIS — F0280 Dementia in other diseases classified elsewhere without behavioral disturbance: Secondary | ICD-10-CM

## 2011-07-05 MED ORDER — PANTOPRAZOLE SODIUM 40 MG PO TBEC: 40.0000 mg | DELAYED_RELEASE_TABLET | Freq: Every day | ORAL | Status: DC

## 2011-07-05 NOTE — Assessment & Plan Note (Signed)
He is not reasonable about the course of his stomach perforation. He doesn't currently perceive a memory problem.

## 2011-07-05 NOTE — Assessment & Plan Note (Signed)
Plug of cerumen was removed from the right ear canal manually and from the left ear canal by flushing both tympanic membranes appeared normal

## 2011-07-05 NOTE — Assessment & Plan Note (Signed)
Slowly improving pain and range of motion

## 2011-07-05 NOTE — Progress Notes (Addendum)
  Subjective:    Patient ID: Devin Clark, male    DOB: 1921-08-17, 77 y.o.   MRN: [id number]  HPI  Patient was seen today in geriatric clinic to followup nursing home discharge. He continues to drive and so was found to be disqualified from home health. He does not wish to continue physical therapy at this time. He is doing the exercises that I gave him. He he states he has some mild shoulder pain but he does not want an injection today. He states that he is concerned that his hospital stay represents malpractice and he would like to get a Clinical research associate. He has trouble understanding the course of events that led to his surgery. This was explained to him in detail during this visit.  IADL Independent Needs Assistance Dependent  Cooking X    Housework X    Manage Medications X    Manage the telephone X    Shopping for food, clothes, Meds, etc X    Use transportation X    Manage Finances X         Balance Abnormal Patient value  Sitting balance    Arise    Attempts to arise    Immediate standing balance    Standing balance    Nudge  Not done  Eyes closed    360 degree turn    Sitting down     Gait Abnormal Patient value  Initiation of gait    Step length-left    Step length-right    Step height-left  Mild reduction  Step height-right  Mild reduction  Step symmetry    Step continuity    Path  Occasional step to the side  Trunk    Walking stance  Narrow based   MMSE Score: 20/30. Most points lost in Attention and Calculation section. Pt was able to recall 1 of 3 words.    Review of Systems     Objective:   Physical Exam  Constitutional: He is oriented to person, place, and time. He appears well-developed and well-nourished.  HENT:  Head: Normocephalic and atraumatic.  Right Ear: Decreased hearing is noted.  Left Ear: Decreased hearing is noted.       Cerumen impaction bilaterally.   Eyes: Conjunctivae and EOM are normal. Pupils are equal, round, and reactive to light.    Neck: Normal range of motion. Neck supple.  Cardiovascular: Normal rate, regular rhythm and normal heart sounds.   Pulmonary/Chest: Effort normal and breath sounds normal.  Abdominal: Soft. He exhibits no distension. There is no tenderness. There is no rebound.       Midline incision intact and well-healed without erythema, edema, or discharge.   Musculoskeletal: He exhibits no edema.       Right shoulder: He exhibits decreased range of motion.       Left shoulder: He exhibits normal range of motion.  Neurological: He is alert and oriented to person, place, and time. No cranial nerve deficit. Coordination normal.  Skin: Skin is warm and dry.  Psychiatric: He has a normal mood and affect.  His R shoulder had negative impingement and drop signs.  He is impulsive in his movements.  Couldn't hear 1000 Hz on either side at 40 dB, but still water in left side from flushing     Assessment & Plan:  I interviewed and examined this patient and discussed the treatment plan with Dr Hulen Luster

## 2011-07-05 NOTE — Assessment & Plan Note (Signed)
Functioning well. Wound healing well

## 2011-07-05 NOTE — Assessment & Plan Note (Signed)
well controlled  

## 2011-07-05 NOTE — Assessment & Plan Note (Signed)
Improving range of motion and no indication of complete rotator cuff tear.

## 2011-07-05 NOTE — Assessment & Plan Note (Signed)
No sx, will defer restarting aspirin to Dr Clarene Duke, and will send him an update letter.

## 2011-07-05 NOTE — Assessment & Plan Note (Signed)
Asymptomatic, abdominal wound healing well

## 2011-07-05 NOTE — Assessment & Plan Note (Signed)
Abdominal wound healing well

## 2011-07-05 NOTE — Progress Notes (Signed)
  Subjective:    Patient ID: Devin Clark, male    DOB: 1921-07-01, 77 y.o.   MRN: [id number]  HPIReady to go home.     Review of Systems     Objective:   Physical ExamDocumented in NH chart. I interviewed and examined this patient and discussed the treatment plan with Dr Hulen Luster.         Assessment & Plan:  Home with outpatient physical therapy for right shoulder pain. Follow-up in Geriatric clinic at the [facility] since he doesn't have primary care physician.

## 2011-07-05 NOTE — Patient Instructions (Signed)
Make an appointment with Dr Clarene Duke soon. I'll send him a letter about your rehab stay. He'll need to decide when you should restart aspirin.   Make sure you continue the Pantoprazole so the hole in your stomach will heal completely.   Return to see Dr Sheffield Slider if you are having problems with your stomach or shoulder.

## 2011-09-19 ENCOUNTER — Telehealth: Payer: Self-pay | Admitting: Family Medicine

## 2011-09-19 MED ORDER — PANTOPRAZOLE SODIUM 40 MG PO TBEC: 40.0000 mg | DELAYED_RELEASE_TABLET | Freq: Every day | ORAL | Status: DC

## 2011-09-19 NOTE — Telephone Encounter (Signed)
Refill of protonix sent in, fyi to MD

## 2011-09-19 NOTE — Telephone Encounter (Signed)
Spoke with patient, he was wanting some more vicodin for the shoulder pain, I informed him that MD would most likely want him to com ein and be seen before giving any refills on this. He states that he would just use some ben-gay until hecome in and be seen but would like the medicine for his stomach (?protonix) refilled today as he only has one pill left. Will forward to PCP

## 2011-09-19 NOTE — Telephone Encounter (Signed)
Pt has only 1 pill left of his Protonix and needs it called in as soon as possible.  Dr Hulen Luster not available.

## 2011-09-19 NOTE — Telephone Encounter (Signed)
Patient spoke with Renard Hamper, LPN earlier today.  Will route to blue team for MD to possibly approve Protonix refill.  Gaylene Brooks, RN

## 2011-09-19 NOTE — Telephone Encounter (Signed)
Needs the medication for his stomach and for his shoulder, he did not know the names of the medication, sent to [facility] on [address].  Please give him a call back about this.

## 2012-03-18 ENCOUNTER — Emergency Department (HOSPITAL_COMMUNITY)
Admission: EM | Admit: 2012-03-18 | Discharge: 2012-03-18 | Disposition: A | Discharge status: Home / Self Care | Payer: Medicare Other | Attending: Emergency Medicine | Admitting: Emergency Medicine

## 2012-03-18 ENCOUNTER — Encounter (HOSPITAL_COMMUNITY): Payer: Self-pay | Admitting: Emergency Medicine

## 2012-03-18 ENCOUNTER — Emergency Department (HOSPITAL_COMMUNITY): Payer: Medicare Other

## 2012-03-18 DIAGNOSIS — Z87891 Personal history of nicotine dependence: Secondary | ICD-10-CM | POA: Insufficient documentation

## 2012-03-18 DIAGNOSIS — Z950 Presence of cardiac pacemaker: Secondary | ICD-10-CM | POA: Insufficient documentation

## 2012-03-18 DIAGNOSIS — M25519 Pain in unspecified shoulder: Secondary | ICD-10-CM | POA: Insufficient documentation

## 2012-03-18 DIAGNOSIS — I2510 Atherosclerotic heart disease of native coronary artery without angina pectoris: Secondary | ICD-10-CM | POA: Insufficient documentation

## 2012-03-18 DIAGNOSIS — I10 Essential (primary) hypertension: Secondary | ICD-10-CM | POA: Insufficient documentation

## 2012-03-18 DIAGNOSIS — M25512 Pain in left shoulder: Secondary | ICD-10-CM

## 2012-03-18 MED ORDER — HYDROCODONE-ACETAMINOPHEN 5-325 MG PO TABS: 2.0000 | ORAL_TABLET | Freq: Four times a day (QID) | ORAL | Status: AC | PRN

## 2012-03-18 MED ORDER — TRAMADOL HCL 50 MG PO TABS
100.0000 mg | ORAL_TABLET | Freq: Once | ORAL | Status: AC
  Administered 2012-03-18: 100 mg via ORAL
  Filled 2012-03-18: qty 2

## 2012-03-18 MED ORDER — ACETAMINOPHEN 325 MG PO TABS
650.0000 mg | ORAL_TABLET | Freq: Once | ORAL | Status: AC
  Administered 2012-03-18: 650 mg via ORAL
  Filled 2012-03-18: qty 2

## 2012-03-18 NOTE — ED Notes (Signed)
Pt c/o left shoulder pain starting yesterday; pt sts hx of same; pain worse with palpation; pt denies obvious injury; CMS intact

## 2012-03-18 NOTE — ED Notes (Signed)
Pt reports no injury to the left shoulder. There is swelling and red area to the left shoulder/humerus. Pt takes coumadin but is poor historian and "not sure why I take it"

## 2012-03-18 NOTE — ED Provider Notes (Signed)
History   This chart was scribed for Ward Givens, MD by Charolett Bumpers . The patient was seen in room [facility].    CSN: [id number]  Arrival date & time 03/18/12  1012   First MD Initiated Contact with Patient 03/18/12 1102      Chief Complaint  Patient presents with  . Shoulder Pain    (Consider location/radiation/quality/duration/timing/severity/associated sxs/prior treatment) HPI  Pt was seen at 11:33am  Devin Clark is a 78 y.o. male who presents to the Emergency Department complaining of constant moderate left shoulder pain, onset 2-3:30pm yesterday. Pt reports seeing the cardiologist Dr. Clarene Duke yesterday for coumadin check. Pt reports he is left handed. Pt denies any known injuries. Pt denies any prior episodes of pain in the left shoulder. Pt denies doing anything different in the last couple days, but states that he was cutting the shrubbery last week . Pt reports movement aggravates the pain. Pt denies knowing anything that relieves the pain. He denies numbness in his fingers. Sometimes the pain radiates up into the left side of his neck.  No PCP Cardiologist Dr Langston Reusing   Past Medical History  Diagnosis Date  . Hypertension   . CAD (coronary artery disease)   . Stented coronary artery 1998    Past Surgical History  Procedure Date  . Cataract extraction, bilateral 2011  . Patch perforated duodenal ulcer 05/28/11    Cheree Ditto patch  . Pacemaker insertion 06/05/11    complete heart block  . Chest tube insertion 06/06/11    right iatrogenic pneumothorax    History reviewed. No pertinent family history.  History  Substance Use Topics  . Smoking status: Former Smoker -- 1 years    Types: Cigarettes  . Smokeless tobacco: Not on file  . Alcohol Use: No  lives alone   Social History:  Pt reports he lives alone. Pt denies a h/o smoking.    Review of Systems  Musculoskeletal: Positive for arthralgias.    Allergies  Review of patient's allergies  indicates no known allergies.  Home Medications   Current Outpatient Rx  Name Route Sig Dispense Refill  . VITAMIN B-12 PO Oral Take 1 tablet by mouth daily.    . ENALAPRIL MALEATE 5 MG PO TABS Oral Take 2.5 mg by mouth 2 (two) times daily.    . OMEGA-3 FATTY ACIDS 1000 MG PO CAPS Oral Take 2 capsules (2 g total) by mouth daily. 30 capsule 1  . BENGAY EX Apply externally Apply 1 application topically as needed. To shoulder forpain    . METOPROLOL TARTRATE 50 MG PO TABS Oral Take 50 mg by mouth 2 (two) times daily.    . ADULT MULTIVITAMIN W/MINERALS CH Oral Take 1 tablet by mouth daily.    . NEOSPORIN EX Apply externally Apply 1 application topically as needed. To shoulder    . SIMVASTATIN 80 MG PO TABS Oral Take 80 mg by mouth at bedtime.    Marland Kitchen VITAMIN B-1 PO Oral Take 1 tablet by mouth daily.    Marland Kitchen VITAMIN C 500 MG PO TABS Oral Take 500 mg by mouth daily.    Marland Kitchen VITAMIN E 400 UNITS PO CAPS Oral Take 400 Units by mouth daily.    . WARFARIN SODIUM 5 MG PO TABS Oral Take 2.5-5 mg by mouth daily.      BP 118/83  Pulse 81  Temp 98.1 F (36.7 C) (Oral)  Resp 20  SpO2 100%  Vital signs normal  Physical Exam  Nursing note and vitals reviewed. Constitutional: He is oriented to person, place, and time. He appears well-developed and well-nourished. No distress.       Hard of hearing  HENT:  Head: Normocephalic and atraumatic.  Right Ear: External ear normal.  Left Ear: External ear normal.  Eyes: Conjunctivae and EOM are normal.  Neck: Normal range of motion. Neck supple. No tracheal deviation present.       Non-tender neck.   Cardiovascular:       Good distal pulses.   Pulmonary/Chest: Effort normal. No respiratory distress.  Musculoskeletal: He exhibits edema and tenderness.       Pain in abduction of left shoulder.  Swelling of left shoulder, anteriorly as compared to the right shoulder. Nontender in left trapezius, nontender in clavicle. Good distal function and pulses    Neurological: He is alert and oriented to person, place, and time.  Skin: Skin is warm and dry.       Color is normal. Temp of skin is normal.   Psychiatric: He has a normal mood and affect. His behavior is normal.    ED Course  Procedures (including critical care time)   Medications  traMADol (ULTRAM) tablet 100 mg (100 mg Oral Given 03/18/12 1154)  acetaminophen (TYLENOL) tablet 650 mg (650 mg Oral Given 03/18/12 1154)     DIAGNOSTIC STUDIES: Oxygen Saturation is 100% on room air, normal by my interpretation.    COORDINATION OF CARE:  11:35--I discussed treatment plan including x-ray with pt and pt agreed. 11:45--Medication Orders: Tramadol (Ultram) tablet 100 mg-once; Acetaminophen (Tylenol) tablet 650 mg-once 12:25--I rechecked the pt and let them know that their x-ray results are not in yet.  12:50--I rechecked the pt and asked if the pain medication relieved the pain.   Dg Shoulder Left  03/18/2012  *RADIOLOGY REPORT*  Clinical Data: Sudden onset left shoulder pain and limited range of motion.  No known injury.  LEFT SHOULDER - 2+ VIEW  Comparison: None.  Findings: Mild inferior glenohumeral spur formation.  Mild greater tuberosity hyperostosis.  Right subclavian pacemaker leads.  IMPRESSION: Mild degenerative changes.  Original Report Authenticated By: Darrol Angel, M.D.     1. Shoulder pain, left     New Prescriptions   HYDROCODONE-ACETAMINOPHEN (NORCO) 5-325 MG PER TABLET    Take 2 tablets by mouth every 6 (six) hours as needed for pain.    Plan discharge  Devoria Albe, MD, FACEP   MDM Pt has a lot of swelling in his left shoulder, ? Gout, however he is on coumadin so NSAID cannot be given.    I personally performed the services described in this documentation, which was scribed in my presence. The recorded information has been reviewed and considered.  Devoria Albe, MD, Armando Gang         Ward Givens, MD 03/18/12 1259

## 2012-06-16 ENCOUNTER — Ambulatory Visit: Payer: Medicare Other | Admitting: Family Medicine

## 2012-06-27 ENCOUNTER — Ambulatory Visit: Payer: Medicare Other | Admitting: Family Medicine

## 2012-11-26 ENCOUNTER — Ambulatory Visit: Payer: Self-pay | Admitting: Internal Medicine

## 2012-11-26 DIAGNOSIS — Z7901 Long term (current) use of anticoagulants: Secondary | ICD-10-CM | POA: Insufficient documentation

## 2012-11-26 DIAGNOSIS — I4891 Unspecified atrial fibrillation: Secondary | ICD-10-CM | POA: Insufficient documentation

## 2013-01-26 ENCOUNTER — Ambulatory Visit (INDEPENDENT_AMBULATORY_CARE_PROVIDER_SITE_OTHER): Payer: Medicare Other | Admitting: Pharmacist Clinician (PhC)/ Clinical Pharmacy Specialist

## 2013-01-26 VITALS — BP 128/70 | HR 72

## 2013-01-26 DIAGNOSIS — I4891 Unspecified atrial fibrillation: Secondary | ICD-10-CM

## 2013-01-26 DIAGNOSIS — Z7901 Long term (current) use of anticoagulants: Secondary | ICD-10-CM

## 2013-01-26 LAB — POCT INR: INR: 2.2

## 2013-01-28 ENCOUNTER — Other Ambulatory Visit: Payer: Self-pay | Admitting: Internal Medicine

## 2013-01-29 ENCOUNTER — Other Ambulatory Visit: Payer: Self-pay | Admitting: *Deleted

## 2013-01-29 MED ORDER — NITROGLYCERIN 0.4 MG SL SUBL: 0.4000 mg | SUBLINGUAL_TABLET | SUBLINGUAL | Status: DC | PRN

## 2013-01-29 MED ORDER — WARFARIN SODIUM 5 MG PO TABS: 2.5000 mg | ORAL_TABLET | Freq: Every day | ORAL | Status: DC

## 2013-01-29 NOTE — Telephone Encounter (Signed)
Sent refill

## 2013-01-30 NOTE — Telephone Encounter (Signed)
Warfarin prescription

## 2013-02-22 ENCOUNTER — Encounter: Payer: Self-pay | Admitting: *Deleted

## 2013-02-23 ENCOUNTER — Ambulatory Visit: Payer: Medicare Other | Admitting: Pharmacist Clinician (PhC)/ Clinical Pharmacy Specialist

## 2013-02-23 ENCOUNTER — Ambulatory Visit: Payer: Medicare Other | Admitting: Cardiovascular Disease

## 2013-02-24 ENCOUNTER — Encounter: Payer: Self-pay | Admitting: Internal Medicine

## 2013-02-25 ENCOUNTER — Ambulatory Visit (INDEPENDENT_AMBULATORY_CARE_PROVIDER_SITE_OTHER): Payer: Medicare Other | Admitting: Cardiovascular Disease

## 2013-02-25 ENCOUNTER — Ambulatory Visit: Payer: Medicare Other | Admitting: Cardiovascular Disease

## 2013-02-25 ENCOUNTER — Encounter: Payer: Self-pay | Admitting: Cardiovascular Disease

## 2013-02-25 ENCOUNTER — Ambulatory Visit (INDEPENDENT_AMBULATORY_CARE_PROVIDER_SITE_OTHER): Payer: Medicare Other | Admitting: Pharmacist Clinician (PhC)/ Clinical Pharmacy Specialist

## 2013-02-25 VITALS — BP 122/82 | HR 85 | Resp 20 | Ht 69.0 in | Wt 138.9 lb

## 2013-02-25 DIAGNOSIS — Z950 Presence of cardiac pacemaker: Secondary | ICD-10-CM

## 2013-02-25 DIAGNOSIS — G309 Alzheimer's disease, unspecified: Secondary | ICD-10-CM

## 2013-02-25 DIAGNOSIS — F0280 Dementia in other diseases classified elsewhere without behavioral disturbance: Secondary | ICD-10-CM

## 2013-02-25 DIAGNOSIS — I4891 Unspecified atrial fibrillation: Secondary | ICD-10-CM

## 2013-02-25 DIAGNOSIS — Z7901 Long term (current) use of anticoagulants: Secondary | ICD-10-CM

## 2013-02-25 DIAGNOSIS — I10 Essential (primary) hypertension: Secondary | ICD-10-CM

## 2013-02-25 DIAGNOSIS — I2510 Atherosclerotic heart disease of native coronary artery without angina pectoris: Secondary | ICD-10-CM

## 2013-02-25 LAB — PACEMAKER DEVICE OBSERVATION

## 2013-02-25 NOTE — Progress Notes (Signed)
In office pacemaker interrogation. Normal device function. Minimal changes made this session. 

## 2013-02-25 NOTE — Patient Instructions (Addendum)
Your physician has recommended you make the following change in your medication: DISCONTINUE WARFARIN(COUMADIN)! THROW BOTTLES AWAY!!!!!! START ASPIRIN 81 MG DAILY ON Monday!   [facility]: [phone] [address] [facility]   [facility]: [phone] [address]

## 2013-02-28 MED ORDER — ENALAPRIL MALEATE 5 MG PO TABS: 2.5000 mg | ORAL_TABLET | Freq: Two times a day (BID) | ORAL | Status: DC

## 2013-02-28 NOTE — Assessment & Plan Note (Addendum)
The device is implanted for sinus node arrest that occurred while he was hospitalized for perforated ulcer, but he also has evidence of AV conduction disease. He still paces the ventricle 39% of the time. He has been in atrial fibrillation/occasionally atrial flutter without interruption since April 5 by the pacemaker records. The ventricular rate has been as high as 141 beats per minute,  this has occurred for extremely brief periods of time; for the most part his heart rate is around 70-80 beats per minute; today in the office is 90 beats per minute and the rhythm appears to be atrial flutter with predominantly 2-1 conduction.  He has been on warfarin anticoagulation but his anticoagulation levels have recently become erratic. His memory has clearly become very poor and at this point I think warfarin is extremely dangerous. I've recommended that he discontinue warfarin and take aspirin only for embolism prophylaxis. This will be less effective in preventing stroke but will be safer. I have asked him to show with all the bottles of warfarin that he has at home (part of the confusion is likely due to the fact that he still has a variety of doses of warfarin his bottles at home).  His atrial arrhythmias not symptomatic despite the fact that it has been ongoing for over 2 months. Cardioversion does not seem necessary since we cannot anticoagulate him would actually be perilous. Recommend continued treatment with rate control medications only

## 2013-02-28 NOTE — Assessment & Plan Note (Signed)
This has clearly progressed. He is an educated man with previous very high functions and has been able to cover up the deficits quite well until now. However his dementia is to be taken in consideration when making his medications are prescribed.
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
## 2013-02-28 NOTE — Assessment & Plan Note (Signed)
Sustained for 2 months, likely to become permanent. Today he is in atrial flutter with 21 AV block but judging by the atrial rates that have been as high as 590 beats per minute he spends most of the time in atrial fibrillation. C. considerations above regarding the dangers of warfarin anticoagulation in this gentleman with dementia. He'll take aspirin for stroke prophylaxis

## 2013-02-28 NOTE — Assessment & Plan Note (Signed)
Appropriate control. If his medications are changed it should be kept in mind that his beta blocker also serves for ventricular rate control

## 2013-02-28 NOTE — Assessment & Plan Note (Signed)
Currently asymptomatic. As cardiac catheterization performed in 2008 showed a widely patent stent in the right coronary artery, 40-50% stenosis in the proximal LAD and 50% stenosis at the ostium of the first diagonal artery with a total occlusion of the left circumflex coronary artery with left to left collaterals.

## 2013-03-03 ENCOUNTER — Other Ambulatory Visit: Payer: Self-pay | Admitting: *Deleted

## 2013-03-03 MED ORDER — ENALAPRIL MALEATE 5 MG PO TABS: 2.5000 mg | ORAL_TABLET | Freq: Two times a day (BID) | ORAL | Status: DC

## 2013-03-03 NOTE — Telephone Encounter (Signed)
Enalapril refilled electronically

## 2013-05-12 ENCOUNTER — Encounter (HOSPITAL_COMMUNITY): Payer: Self-pay | Admitting: Emergency Medicine

## 2013-05-12 ENCOUNTER — Emergency Department (INDEPENDENT_AMBULATORY_CARE_PROVIDER_SITE_OTHER)
Admission: EM | Admit: 2013-05-12 | Discharge: 2013-05-12 | Disposition: A | Discharge status: Nursing Home (Includes ICF) | Payer: Medicare Other | Source: Home / Self Care | Attending: Family Medicine | Admitting: Family Medicine

## 2013-05-12 ENCOUNTER — Emergency Department (HOSPITAL_COMMUNITY)
Admission: EM | Admit: 2013-05-12 | Discharge: 2013-05-12 | Disposition: A | Discharge status: Home / Self Care | Payer: Medicare Other

## 2013-05-12 DIAGNOSIS — F0390 Unspecified dementia without behavioral disturbance: Secondary | ICD-10-CM

## 2013-05-12 NOTE — ED Notes (Signed)
Waiting for transportation

## 2013-05-12 NOTE — ED Provider Notes (Addendum)
CSN: [id number]     Arrival date & time 05/12/13  1147 History   None    Chief Complaint  Patient presents with  . Dizziness   (Consider location/radiation/quality/duration/timing/severity/associated sxs/prior Treatment) Patient is a 79 y.o. male presenting with neurologic complaint. The history is provided by the patient and a caregiver.  Neurologic Problem This is a chronic problem. The current episode started more than 1 week ago. The problem has not changed since onset.Pertinent negatives include no chest pain, no abdominal pain and no headaches. Associated symptoms comments: Dizziness , falling, wt loss, back pain..    Past Medical History  Diagnosis Date  . Hypertension   . CAD (coronary artery disease)   . Stented coronary artery 1998  . Sinus node dysfunction     St.Jude pacemaker 06/05/2011  . Atrial fibrillation   . Dyslipidemia    Past Surgical History  Procedure Laterality Date  . Cataract extraction, bilateral  2011  . Patch perforated duodenal ulcer  05/28/11    Cheree Ditto patch  . Pacemaker insertion  06/05/11    St.Jude Accent DR  . Chest tube insertion  06/06/11    right iatrogenic pneumothorax  . Coronary angioplasty with stent placement  12/30/1996    stent RCA  . Cardiac catheterization  03/07/2000    100% CX,widely patent stent in RCA  . Cardiac catheterization  03/26/2007    100% CX,widely patnet stent in RCA  . Nm myoview ltd  03/04/2007    High risk,mild-mod anterolateral/inferolateral ischemia   Family History  Problem Relation Age of Onset  . Heart failure Mother   . Heart failure Father   . Heart failure Sister    History  Substance Use Topics  . Smoking status: Former Smoker -- 1 years    Types: Cigarettes  . Smokeless tobacco: Never Used  . Alcohol Use: No    Review of Systems  Constitutional: Negative.  Negative for appetite change.  Cardiovascular: Negative for chest pain.  Gastrointestinal: Negative for nausea, vomiting and abdominal pain.   Musculoskeletal: Positive for back pain. Negative for gait problem.  Skin: Positive for wound.  Neurological: Negative for headaches.  Hematological: Bruises/bleeds easily.    Allergies  Review of patient's allergies indicates no known allergies.  Home Medications   Current Outpatient Rx  Name  Route  Sig  Dispense  Refill  . Cyanocobalamin (VITAMIN B-12 PO)   Oral   Take 1 tablet by mouth daily.         . enalapril (VASOTEC) 5 MG tablet   Oral   Take 0.5 tablets (2.5 mg total) by mouth 2 (two) times daily.   60 tablet   3   . fish oil-omega-3 fatty acids 1000 MG capsule   Oral   Take 2 g by mouth daily.         . Menthol, Topical Analgesic, (BENGAY EX)   Apply externally   Apply 1 application topically as needed. To shoulder forpain         . metoprolol (LOPRESSOR) 50 MG tablet   Oral   Take 50 mg by mouth 2 (two) times daily.         . Multiple Vitamin (MULTIVITAMIN WITH MINERALS) TABS   Oral   Take 1 tablet by mouth daily.         Marland Kitchen Neomycin-Bacitracin-Polymyxin (NEOSPORIN EX)   Apply externally   Apply 1 application topically as needed. To shoulder         . nitroGLYCERIN (NITROSTAT)  0.4 MG SL tablet   Sublingual   Place 1 tablet (0.4 mg total) under the tongue every 5 (five) minutes as needed for chest pain.   25 tablet   3   . simvastatin (ZOCOR) 80 MG tablet   Oral   Take 80 mg by mouth at bedtime.         . Thiamine HCl (VITAMIN B-1 PO)   Oral   Take 1 tablet by mouth daily.         . vitamin C (ASCORBIC ACID) 500 MG tablet   Oral   Take 500 mg by mouth daily.         . vitamin E 400 UNIT capsule   Oral   Take 400 Units by mouth daily.         Marland Kitchen warfarin (COUMADIN) 2.5 MG tablet      TAKE ONE TABLET BY MOUTH EVERY DAY OR AS DIRECTED   30 tablet   6   . warfarin (COUMADIN) 5 MG tablet   Oral   Take 0.5-1 tablets (2.5-5 mg total) by mouth daily.   30 tablet   3    BP 128/80  Pulse 65  Temp(Src) 97.7 F (36.5  C) (Oral)  Resp 24  SpO2 97% Physical Exam  Nursing note and vitals reviewed. Constitutional: He appears well-developed and well-nourished.  HENT:  Head: Normocephalic.  Eyes: Pupils are equal, round, and reactive to light.  Neck: Normal range of motion. Neck supple.  Cardiovascular: Regular rhythm and normal heart sounds.   Pulmonary/Chest: Effort normal and breath sounds normal.  Abdominal: Soft. Bowel sounds are normal. There is no tenderness.  Skin: Skin is warm and dry.  Multiple traumatic skin tears and bruises.    ED Course  Procedures (including critical care time) Labs Review Labs Reviewed - No data to display Imaging Review No results found.  MDM   1. Dementia    Sent for eval of dizziness, back pain ,wt loss, failure to thrive.    Linna Hoff, MD 05/12/13 1254  Linna Hoff, MD 05/12/13 [phone]

## 2013-05-12 NOTE — ED Notes (Signed)
Patient lives alone.  Neighbor has brought him to ucc today for evaluation of dizziness, dizziness has been going on for a year and has endured many falls.  Patient also c/o low back pain.  Patient has multiple bruising.  Devin Clark willing to take him wherever he needs to go.  Patient need pcp referral.

## 2013-07-03 ENCOUNTER — Ambulatory Visit: Payer: Medicare Other

## 2013-07-03 ENCOUNTER — Ambulatory Visit (INDEPENDENT_AMBULATORY_CARE_PROVIDER_SITE_OTHER): Payer: Medicare Other | Admitting: Internal Medicine

## 2013-07-03 VITALS — BP 106/64 | HR 60 | Temp 97.3°F | Resp 16

## 2013-07-03 DIAGNOSIS — R079 Chest pain, unspecified: Secondary | ICD-10-CM

## 2013-07-03 DIAGNOSIS — S0093XA Contusion of unspecified part of head, initial encounter: Secondary | ICD-10-CM

## 2013-07-03 DIAGNOSIS — S0003XA Contusion of scalp, initial encounter: Secondary | ICD-10-CM

## 2013-07-03 DIAGNOSIS — S61409A Unspecified open wound of unspecified hand, initial encounter: Secondary | ICD-10-CM

## 2013-07-03 DIAGNOSIS — S61412A Laceration without foreign body of left hand, initial encounter: Secondary | ICD-10-CM

## 2013-07-03 NOTE — Patient Instructions (Signed)

## 2013-07-03 NOTE — Progress Notes (Signed)
Patient ID: AKUL LEGGETTE MRN: [id number], DOB: 12/16/1920, 79 y.o. Date of Encounter: 07/03/2013, 4:00 PM   PROCEDURE NOTE: Verbal consent obtained. Sterile technique employed. Numbing: Anesthesia obtained with 1% lidocaine with epinephrine.   Cleansed with soap and water. Irrigated.  Wound explored, no deep structures involved, no foreign bodies.   Wound repaired with # 4 HM and #2 SI sutures using 4-0 ethilon.  Hemostasis obtained. Wound cleansed and dressed.  Wound care instructions including precautions covered with patient. Handout given.  Anticipate suture removal in 10 days  Rhoderick Moody, PA-C 07/03/2013 4:00 PM
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
## 2013-07-11 ENCOUNTER — Ambulatory Visit (INDEPENDENT_AMBULATORY_CARE_PROVIDER_SITE_OTHER): Payer: Medicare Other | Admitting: Physician Assistant

## 2013-07-11 DIAGNOSIS — Z5189 Encounter for other specified aftercare: Secondary | ICD-10-CM

## 2013-07-11 DIAGNOSIS — S61401D Unspecified open wound of right hand, subsequent encounter: Secondary | ICD-10-CM

## 2013-07-11 NOTE — Progress Notes (Signed)
  Subjective:    Patient ID: Devin Clark, male    DOB: 07-May-1921, 79 y.o.   MRN: [id number]  Suture / Staple Removal   79 year old male presents for suture removal. Wound is on dorsum of left hand. DOI 07/03/13. Doing well without issues or complaints. Denies erythema, warmth, or drainage.  No other concerns or questions today.     Review of Systems  Constitutional: Negative for fever and chills.  Gastrointestinal: Negative for nausea and vomiting.  Skin: Positive for wound. Negative for color change.       Objective:   Physical Exam  Constitutional: He is oriented to person, place, and time. He appears well-developed and well-nourished.  HENT:  Head: Normocephalic and atraumatic.  Right Ear: External ear normal.  Left Ear: External ear normal.  Eyes: Conjunctivae are normal.  Neurological: He is alert and oriented to person, place, and time.  Skin:             Assessment & Plan:  Wound, open, hand with or without fingers, right, subsequent encounter  Sutures removed.  Wound well healing Follow up as needed.

## 2013-08-17 ENCOUNTER — Encounter: Payer: Self-pay | Admitting: *Deleted

## 2013-08-17 ENCOUNTER — Encounter: Payer: Self-pay | Admitting: Cardiovascular Disease

## 2013-08-17 ENCOUNTER — Ambulatory Visit (INDEPENDENT_AMBULATORY_CARE_PROVIDER_SITE_OTHER): Payer: Medicare Other | Admitting: Cardiovascular Disease

## 2013-08-17 VITALS — BP 142/84 | HR 64 | Resp 12 | Ht 69.0 in | Wt 144.6 lb

## 2013-08-17 DIAGNOSIS — E785 Hyperlipidemia, unspecified: Secondary | ICD-10-CM

## 2013-08-17 DIAGNOSIS — I10 Essential (primary) hypertension: Secondary | ICD-10-CM

## 2013-08-17 DIAGNOSIS — Z950 Presence of cardiac pacemaker: Secondary | ICD-10-CM

## 2013-08-17 DIAGNOSIS — F0280 Dementia in other diseases classified elsewhere without behavioral disturbance: Secondary | ICD-10-CM

## 2013-08-17 DIAGNOSIS — I4891 Unspecified atrial fibrillation: Secondary | ICD-10-CM

## 2013-08-17 LAB — PACEMAKER DEVICE OBSERVATION

## 2013-08-17 NOTE — Patient Instructions (Signed)
Your physician recommends that you schedule a follow-up appointment in: 6 months  

## 2013-08-23 ENCOUNTER — Encounter: Payer: Self-pay | Admitting: Cardiovascular Disease

## 2013-08-23 NOTE — Assessment & Plan Note (Addendum)
He appears to have permanent atrial tachyarrhythmia. I remain concerned about the risks of long-term anticoagulation therapy. His recent fall and hand injury reinforce this.

## 2013-08-23 NOTE — Progress Notes (Signed)
Patient ID: Devin Clark, male   DOB: [birth year], 79 y.o.   MRN: [id number]      Reason for office visit His make a followup, hypertension, atrial fibrillation  Devin Clark is in great spirits today. As before he remains unaware of the fact that he has an irregular heart rhythm. He seems to now be in permanent atrial fibrillation for well over 6 months. He has not had any episodes of focal neurological deficits to suggest stroke or TIA. He did have a fall and injured his hand and has a 2-3 cm hematoma on the dorsum of his right wrist. He fell in some bushes in his garden. He did not lose consciousness. Pacemaker interrogation shows normal device function. St. Jude dual chamber pacemaker programmed to VVIR. Makes repetitive statements and asked the same question again and again.   No Known Allergies  Current Outpatient Prescriptions  Medication Sig Dispense Refill  . Cyanocobalamin (VITAMIN B-12 PO) Take 1 tablet by mouth daily.      . enalapril (VASOTEC) 5 MG tablet Take 0.5 tablets (2.5 mg total) by mouth 2 (two) times daily.  60 tablet  3  . fish oil-omega-3 fatty acids 1000 MG capsule Take 2 g by mouth daily.      . Menthol, Topical Analgesic, (BENGAY EX) Apply 1 application topically as needed. To shoulder forpain      . metoprolol (LOPRESSOR) 50 MG tablet Take 50 mg by mouth 2 (two) times daily.      . Multiple Vitamin (MULTIVITAMIN WITH MINERALS) TABS Take 1 tablet by mouth daily.      Marland Kitchen Neomycin-Bacitracin-Polymyxin (NEOSPORIN EX) Apply 1 application topically as needed. To shoulder      . nitroGLYCERIN (NITROSTAT) 0.4 MG SL tablet Place 1 tablet (0.4 mg total) under the tongue every 5 (five) minutes as needed for chest pain.  25 tablet  3  . simvastatin (ZOCOR) 80 MG tablet Take 80 mg by mouth at bedtime.      . Thiamine HCl (VITAMIN B-1 PO) Take 1 tablet by mouth daily.      . vitamin C (ASCORBIC ACID) 500 MG tablet Take 500 mg by mouth daily.      . vitamin E 400 UNIT capsule  Take 400 Units by mouth daily.       No current facility-administered medications for this visit.    Past Medical History  Diagnosis Date  . Hypertension   . CAD (coronary artery disease)   . Stented coronary artery 1998  . Sinus node dysfunction     St.Jude pacemaker 06/05/2011  . Atrial fibrillation   . Dyslipidemia     Past Surgical History  Procedure Laterality Date  . Cataract extraction, bilateral  2011  . Patch perforated duodenal ulcer  05/28/11    Devin Clark patch  . Pacemaker insertion  06/05/11    St.Jude Accent DR  . Chest tube insertion  06/06/11    right iatrogenic pneumothorax  . Coronary angioplasty with stent placement  12/30/1996    stent RCA  . Cardiac catheterization  03/07/2000    100% CX,widely patent stent in RCA  . Cardiac catheterization  03/26/2007    100% CX,widely patnet stent in RCA  . Nm myoview ltd  03/04/2007    High risk,mild-mod anterolateral/inferolateral ischemia    Family History  Problem Relation Age of Onset  . Heart failure Mother   . Heart failure Father   . Heart failure Sister     History   Social  History  . Marital Status: Single    Spouse Name: N/A    Number of Children: 2  . Years of Education: 12   Occupational History  .     Social History Main Topics  . Smoking status: Former Smoker -- 1 years    Types: Cigarettes  . Smokeless tobacco: Never Used  . Alcohol Use: No  . Drug Use: No  . Sexual Activity: Not Currently   Other Topics Concern  . Not on file   Social History Narrative   Retired Systems analyst   Divorced 1966   Two sons living in [place]   Native of [place], spends part of year in [place], [place]    Review of systems: He complains of right shoulder discomfort and limited range of motion He denies memory problems but these are clearly evident during our discussion The patient specifically denies any chest pain at rest or with exertion, dyspnea at rest or with exertion, orthopnea,  paroxysmal nocturnal dyspnea, syncope, palpitations, focal neurological deficits, intermittent claudication, lower extremity edema, unexplained weight gain, cough, hemoptysis or wheezing.  The patient also denies abdominal pain, nausea, vomiting, dysphagia, diarrhea, constipation, polyuria, polydipsia, dysuria, hematuria, frequency, urgency, abnormal bleeding or bruising, fever, chills, unexpected weight changes, mood swings, change in skin or hair texture, change in voice quality, auditory or visual problems, allergic reactions or rashes, new musculoskeletal complaints other than usual "aches and pains".    PHYSICAL EXAM BP 142/84  Pulse 64  Resp 12  Ht 5\' 9"  (1.753 m)  Wt 144 lb 9.6 oz (65.59 kg)  BMI 21.34 kg/m2 General: Alert, oriented x3, no distress  Head: no evidence of trauma, PERRL, EOMI, no exophtalmos or lid lag, no myxedema, no xanthelasma; normal ears, nose and oropharynx  Neck: normal jugular venous pulsations and no hepatojugular reflux; brisk carotid pulses without delay and no carotid bruits  Chest: clear to auscultation, no signs of consolidation by percussion or palpation, normal fremitus, symmetrical and full respiratory . Healthy left subclavian pacemaker site  Cardiovascular: normal position and quality of the apical impulse, regular rhythm, normal first and widely split second heart sound, no murmurs, rubs or gallops  Abdomen: no tenderness or distention, no masses by palpation, no abnormal pulsatility or arterial bruits, normal bowel sounds, no hepatosplenomegaly; surgical scar well-healed  Extremities: no clubbing, cyanosis or edema; 2+ radial, ulnar and brachial pulses bilaterally; 2+ right femoral, posterior tibial and dorsalis pedis pulses; 2+ left femoral, posterior tibial and dorsalis pedis pulses; no subclavian or femoral bruits  Roughly 2 cm hematoma on the dorsum of the right wrist, with intact and healthy appearing overlying skin. Neurological: grossly nonfocal    EKG: Atrial flutter with 2:1 atrial ventricular block right bundle branch block, left anterior fascicular block Lipid Panel  No results found for this basename: chol, trig, hdl, cholhdl, vldl, ldlcalc    BMET    Component Value Date/Time   NA 132* 06/11/2011 0500   K 4.0 06/11/2011 0500   CL 101 06/11/2011 0500   CO2 23 06/11/2011 0500   GLUCOSE 99 06/11/2011 0500   BUN 20 06/11/2011 0500   CREATININE 0.84 06/11/2011 0500   CALCIUM 8.9 06/11/2011 0500   GFRNONAA 76* 06/11/2011 0500   GFRAA 88* 06/11/2011 0500     ASSESSMENT AND PLAN Atrial fibrillation He appears to have permanent atrial tachyarrhythmia. I remain concerned about the risks of long-term anticoagulation therapy. His recent fall and hand injury reinforce this.  Cardiac pacemaker Normally functioning dual-chamber  permanent pacemaker programmed VVI R because he now has permanent atrial fibrillation. St. Jude accent implanted September 2012. Has roughly 60% ventricular pacing the 2 high-grade atrioventricular block. He does not have ventricular high rates. We might consider reducing his beta blocker dose.Only parameters and generator function . Estimated 8 years longevity   Alzheimer's dementia He has clear-cut evidence of dementia. I agree that he has prior high level functioning that covers up as deficits the  Hyperlipidemia He is on a rather high dose of simvastatin. We should review his lipid profile again.  Hypertension Borderline control. I made no changes in his medications today, since decreasing his beta blocker would provide for desirable reduction in ventricular pacing, but would allow his blood pressure to increase further.  Patient Instructions  Your physician recommends that you schedule a follow-up appointment in: 6 months    Check lipid profile   Dijon Cosens  Thurmon Fair, MD, [facility] [phone] office [phone] pager

## 2013-08-23 NOTE — Assessment & Plan Note (Signed)
Borderline control. I made no changes in his medications today, since decreasing his beta blocker would provide for desirable reduction in ventricular pacing, but would allow his blood pressure to increase further.

## 2013-08-23 NOTE — Assessment & Plan Note (Signed)
He is on a rather high dose of simvastatin. We should review his lipid profile again.

## 2013-08-23 NOTE — Assessment & Plan Note (Signed)
Normally functioning dual-chamber permanent pacemaker programmed VVI R because he now has permanent atrial fibrillation. St. Jude accent implanted September 2012. Has roughly 60% ventricular pacing the 2 high-grade atrioventricular block. He does not have ventricular high rates. We might consider reducing his beta blocker dose.Only parameters and generator function . Estimated 8 years longevity

## 2013-08-23 NOTE — Assessment & Plan Note (Signed)
He has clear-cut evidence of dementia. I agree that he has prior high level functioning that covers up as deficits the

## 2013-08-24 ENCOUNTER — Telehealth: Payer: Self-pay | Admitting: *Deleted

## 2013-08-24 DIAGNOSIS — Z79899 Other long term (current) drug therapy: Secondary | ICD-10-CM

## 2013-08-24 DIAGNOSIS — E782 Mixed hyperlipidemia: Secondary | ICD-10-CM

## 2013-08-24 NOTE — Telephone Encounter (Signed)
Message copied by Vita Barley on Mon Aug 24, 2013  3:30 PM ------      Message from: [place], [place]      Created: Sun Aug 23, 2013  9:59 PM       If he has not had a lipid profile with PCP, he needs one please ------

## 2013-08-24 NOTE — Telephone Encounter (Signed)
Called patient to see if he has had recent lab work done through his PCP (Lipid Profile).  He could not understand what I was saying so I will mail a lab order to him to have done at his convenience.

## 2013-09-07 ENCOUNTER — Other Ambulatory Visit: Payer: Self-pay | Admitting: *Deleted

## 2013-09-07 MED ORDER — METOPROLOL TARTRATE 50 MG PO TABS: 50.0000 mg | ORAL_TABLET | Freq: Two times a day (BID) | ORAL | Status: DC

## 2013-09-08 ENCOUNTER — Telehealth: Payer: Self-pay | Admitting: Cardiovascular Disease

## 2013-09-08 MED ORDER — METOPROLOL TARTRATE 50 MG PO TABS: 50.0000 mg | ORAL_TABLET | Freq: Two times a day (BID) | ORAL | Status: DC

## 2013-09-08 NOTE — Telephone Encounter (Signed)
Returned call and pt verified x 2.  Pt informed message received and RN will send to [facility] now as refill yesterday was sent to [facility].  Pt verbalized understanding and agreed w/ plan.  Refill(s) sent to pharmacy.

## 2013-09-08 NOTE — Telephone Encounter (Signed)
Patient walked in today requesting a refill of Metoprolol Tartrate 50mg  tablets, BID. Please call [facility] on [address]. Patient only has 1 pill left and has been getting a few from pharmacy. Please call patient when done. Thanks.

## 2013-09-10 DIAGNOSIS — IMO0002 Reserved for concepts with insufficient information to code with codable children: Secondary | ICD-10-CM

## 2013-09-10 HISTORY — DX: Reserved for concepts with insufficient information to code with codable children: IMO0002

## 2013-11-09 ENCOUNTER — Other Ambulatory Visit: Payer: Self-pay

## 2013-11-09 MED ORDER — SIMVASTATIN 80 MG PO TABS: 80.0000 mg | ORAL_TABLET | Freq: Every day | ORAL | Status: AC

## 2013-11-09 NOTE — Telephone Encounter (Signed)
Rx was sent to pharmacy electronically. 

## 2014-03-31 ENCOUNTER — Encounter: Payer: Self-pay | Admitting: *Deleted

## 2014-06-09 ENCOUNTER — Encounter: Payer: Self-pay | Admitting: *Deleted

## 2014-06-17 ENCOUNTER — Emergency Department (HOSPITAL_COMMUNITY)
Admission: EM | Admit: 2014-06-17 | Discharge: 2014-06-17 | Disposition: A | Discharge status: Home / Self Care | Payer: Medicare Other | Attending: Emergency Medicine | Admitting: Emergency Medicine

## 2014-06-17 ENCOUNTER — Emergency Department (HOSPITAL_COMMUNITY): Payer: Medicare Other

## 2014-06-17 ENCOUNTER — Encounter (HOSPITAL_COMMUNITY): Payer: Self-pay | Admitting: Emergency Medicine

## 2014-06-17 DIAGNOSIS — Z87891 Personal history of nicotine dependence: Secondary | ICD-10-CM | POA: Diagnosis not present

## 2014-06-17 DIAGNOSIS — Z9861 Coronary angioplasty status: Secondary | ICD-10-CM | POA: Insufficient documentation

## 2014-06-17 DIAGNOSIS — Z23 Encounter for immunization: Secondary | ICD-10-CM | POA: Insufficient documentation

## 2014-06-17 DIAGNOSIS — I2510 Atherosclerotic heart disease of native coronary artery without angina pectoris: Secondary | ICD-10-CM | POA: Diagnosis not present

## 2014-06-17 DIAGNOSIS — Z79899 Other long term (current) drug therapy: Secondary | ICD-10-CM | POA: Diagnosis not present

## 2014-06-17 DIAGNOSIS — I10 Essential (primary) hypertension: Secondary | ICD-10-CM | POA: Insufficient documentation

## 2014-06-17 DIAGNOSIS — E785 Hyperlipidemia, unspecified: Secondary | ICD-10-CM | POA: Insufficient documentation

## 2014-06-17 DIAGNOSIS — Z9889 Other specified postprocedural states: Secondary | ICD-10-CM | POA: Insufficient documentation

## 2014-06-17 DIAGNOSIS — S0101XA Laceration without foreign body of scalp, initial encounter: Secondary | ICD-10-CM | POA: Insufficient documentation

## 2014-06-17 DIAGNOSIS — S0181XA Laceration without foreign body of other part of head, initial encounter: Secondary | ICD-10-CM | POA: Diagnosis not present

## 2014-06-17 DIAGNOSIS — S098XXA Other specified injuries of head, initial encounter: Secondary | ICD-10-CM | POA: Diagnosis present

## 2014-06-17 DIAGNOSIS — S0990XA Unspecified injury of head, initial encounter: Secondary | ICD-10-CM

## 2014-06-17 MED ORDER — OXYCODONE-ACETAMINOPHEN 5-325 MG PO TABS
2.0000 | ORAL_TABLET | Freq: Once | ORAL | Status: AC
  Administered 2014-06-17: 2 via ORAL
  Filled 2014-06-17: qty 2

## 2014-06-17 MED ORDER — LIDOCAINE-EPINEPHRINE 2 %-1:100000 IJ SOLN
10.0000 mL | Freq: Once | INTRAMUSCULAR | Status: AC
  Administered 2014-06-17: 10 mL
  Filled 2014-06-17: qty 1

## 2014-06-17 MED ORDER — TETANUS-DIPHTH-ACELL PERTUSSIS 5-2.5-18.5 LF-MCG/0.5 IM SUSP
0.5000 mL | Freq: Once | INTRAMUSCULAR | Status: AC
  Administered 2014-06-17: 0.5 mL via INTRAMUSCULAR
  Filled 2014-06-17: qty 0.5

## 2014-06-17 NOTE — ED Provider Notes (Signed)
CSN: [id number]     Arrival date & time 06/17/14  1849 History   First MD Initiated Contact with Patient 06/17/14 1911     No chief complaint on file.    (Consider location/radiation/quality/duration/timing/severity/associated sxs/prior Treatment) HPI  Per neighbor/caretaker pt lives alone at home. Pt locked self out of house; attempt to climb on trashcan to get through window resulting in fall around 1300 today. Pt has 4 cm laceration to frontal head and 3 cm laceration to right posterior portion of head. Pt alert and oriented x4. Pt denies LOC, CP, SOB, or blood thinners. Caretaker reports right black eye from fall in grass yesterday.   Past Medical History  Diagnosis Date  . Hypertension   . CAD (coronary artery disease)   . Stented coronary artery 1998  . Sinus node dysfunction     St.Jude pacemaker 06/05/2011  . Atrial fibrillation   . Dyslipidemia    Past Surgical History  Procedure Laterality Date  . Cataract extraction, bilateral  2011  . Patch perforated duodenal ulcer  05/28/11    Cheree DittoGraham patch  . Pacemaker insertion  06/05/11    St.Jude Accent DR  . Chest tube insertion  06/06/11    right iatrogenic pneumothorax  . Coronary angioplasty with stent placement  12/30/1996    stent RCA  . Cardiac catheterization  03/07/2000    100% CX,widely patent stent in RCA  . Cardiac catheterization  03/26/2007    100% CX,widely patnet stent in RCA  . Nm myoview ltd  03/04/2007    High risk,mild-mod anterolateral/inferolateral ischemia   Family History  Problem Relation Age of Onset  . Heart failure Mother   . Heart failure Father   . Heart failure Sister    History  Substance Use Topics  . Smoking status: Former Smoker -- 1 years    Types: Cigarettes  . Smokeless tobacco: Never Used  . Alcohol Use: No    Review of Systems  All systems reviewed and negative, other than as noted in HPI.   Allergies  Review of patient's allergies indicates no known allergies.  Home  Medications   Prior to Admission medications   Medication Sig Start Date End Date Taking? Authorizing Provider  Cyanocobalamin (VITAMIN B-12 PO) Take 1 tablet by mouth daily.    Historical Provider, MD  enalapril (VASOTEC) 5 MG tablet Take 0.5 tablets (2.5 mg total) by mouth 2 (two) times daily. 03/03/13   Chrystie NoseKenneth C. Hilty, MD  fish oil-omega-3 fatty acids 1000 MG capsule Take 2 g by mouth daily.    Historical Provider, MD  Menthol, Topical Analgesic, (BENGAY EX) Apply 1 application topically as needed. To shoulder forpain    Historical Provider, MD  metoprolol (LOPRESSOR) 50 MG tablet Take 1 tablet (50 mg total) by mouth 2 (two) times daily. 09/08/13   Mihai Croitoru, MD  Multiple Vitamin (MULTIVITAMIN WITH MINERALS) TABS Take 1 tablet by mouth daily.    Historical Provider, MD  Neomycin-Bacitracin-Polymyxin (NEOSPORIN EX) Apply 1 application topically as needed. To shoulder    Historical Provider, MD  nitroGLYCERIN (NITROSTAT) 0.4 MG SL tablet Place 1 tablet (0.4 mg total) under the tongue every 5 (five) minutes as needed for chest pain. 01/29/13   Chrystie NoseKenneth C. Hilty, MD  simvastatin (ZOCOR) 80 MG tablet Take 1 tablet (80 mg total) by mouth at bedtime. 11/09/13   Mihai Croitoru, MD  Thiamine HCl (VITAMIN B-1 PO) Take 1 tablet by mouth daily.    Historical Provider, MD  vitamin C (ASCORBIC  ACID) 500 MG tablet Take 500 mg by mouth daily.    Historical Provider, MD  vitamin E 400 UNIT capsule Take 400 Units by mouth daily.    Historical Provider, MD   BP 129/92  Pulse 61  Temp(Src) 97.6 F (36.4 C) (Oral)  Resp 18  SpO2 94% Physical Exam  Nursing note and vitals reviewed. Constitutional: He is oriented to person, place, and time. He appears well-developed and well-nourished. No distress.  HENT:  Head: Normocephalic.    Lacerations in depicted areas  Eyes: Conjunctivae and EOM are normal. Pupils are equal, round, and reactive to light. Right eye exhibits no discharge. Left eye exhibits no  discharge.  Neck: Neck supple.  Cardiovascular: Normal rate, regular rhythm and normal heart sounds.  Exam reveals no gallop and no friction rub.   No murmur heard. Pulmonary/Chest: Effort normal and breath sounds normal. No respiratory distress.  Abdominal: Soft. He exhibits no distension. There is no tenderness.  Musculoskeletal: He exhibits no edema and no tenderness.  No midline spinal tenderness  Neurological: He is alert and oriented to person, place, and time. No cranial nerve deficit. He exhibits normal muscle tone. Coordination normal.  Skin: Skin is warm and dry.  Psychiatric: He has a normal mood and affect. His behavior is normal. Thought content normal.    ED Course  Procedures (including critical care time)  LACERATION REPAIR Performed by: Raeford Razor Authorized by: Raeford Razor Consent: Verbal consent obtained. Risks and benefits: risks, benefits and alternatives were discussed Consent given by: patient Patient identity confirmed: provided demographic data Prepped and Draped in normal sterile fashion Wound explored  Laceration Location: posterior scalp  Laceration Length: 3.5 cm  No Foreign Bodies seen or palpated  Anesthesia: local infiltration  Local anesthetic: none  Anesthetic total: n/a  Irrigation method: syringe Amount of cleaning: standard  Skin closure: stapled  Number of sutures: 5  Patient tolerance: Patient tolerated the procedure well with no immediate complications.  LACERATION REPAIR Performed by: Raeford Razor Authorized by: Raeford Razor Consent: Verbal consent obtained. Risks and benefits: risks, benefits and alternatives were discussed Consent given by: patient Patient identity confirmed: provided demographic data Prepped and Draped in normal sterile fashion Wound explored  Laceration Location: forehead Laceration Length: 4 cm  No Foreign Bodies seen or palpated  Anesthesia: local infiltration  Local anesthetic:  lidocaine 2% w epinephrine  Anesthetic total: 5 ml  Irrigation method: syringe Amount of cleaning: standard  Skin closure: single layer, 5-0 mono  Number of sutures: 6  Technique: simple interrupted  Patient tolerance: Patient tolerated the procedure well with no immediate complications.  Labs Review Labs Reviewed - No data to display  Imaging Review No results found.  Ct Head Wo Contrast  06/17/2014   CLINICAL DATA:  Attempted to crawl into a house through a window. She was climbing on a trash can when she fell to the ground. Lacerations to the frontal and posterior scalp. No loss of consciousness. The patient also had a fall in the grass yesterday.  EXAM: CT HEAD WITHOUT CONTRAST  CT CERVICAL SPINE WITHOUT CONTRAST  TECHNIQUE: Multidetector CT imaging of the head and cervical spine was performed following the standard protocol without intravenous contrast. Multiplanar CT image reconstructions of the cervical spine were also generated.  COMPARISON:  CT head without contrast 05/28/2011  FINDINGS: CT HEAD FINDINGS  Atrophy and white matter disease is stable compared to the prior exam. No acute cortical infarct, hemorrhage, or mass lesion is present. Skin staples  are present within the right occipital scalp. There is a left supraorbital scalp laceration. There is no underlying fracture. The paranasal sinuses and mastoid air cells are clear. Atherosclerotic calcifications are noted within the cavernous carotid arteries.  CT CERVICAL SPINE FINDINGS  The cervical spine is imaged from the skullbase through T1-2. Vertebral body heights are maintained. Slight degenerative anterolisthesis is present at C4-5. Alignment is otherwise anatomic. There is chronic loss of disc height at C5-6 and C6-7 with uncovertebral spurring at both levels. Osseous foraminal narrowing is present bilaterally. The soft tissues demonstrate atherosclerotic calcifications within the cavernous carotid arteries bilaterally. The  lung apices are clear.  IMPRESSION: 1. Right occipital and left supraorbital scalp lacerations without underlying fractures. 2. Stable atrophy and white matter disease. 3. No acute intracranial abnormality. 4. Moderate degenerative changes within the cervical spine. 5. No acute fracture or traumatic subluxation in the cervical spine.   Electronically Signed   By: Gennette Pac M.D.   On: 06/17/2014 20:28   Ct Cervical Spine Wo Contrast  06/17/2014   CLINICAL DATA:  Attempted to crawl into a house through a window. She was climbing on a trash can when she fell to the ground. Lacerations to the frontal and posterior scalp. No loss of consciousness. The patient also had a fall in the grass yesterday.  EXAM: CT HEAD WITHOUT CONTRAST  CT CERVICAL SPINE WITHOUT CONTRAST  TECHNIQUE: Multidetector CT imaging of the head and cervical spine was performed following the standard protocol without intravenous contrast. Multiplanar CT image reconstructions of the cervical spine were also generated.  COMPARISON:  CT head without contrast 05/28/2011  FINDINGS: CT HEAD FINDINGS  Atrophy and white matter disease is stable compared to the prior exam. No acute cortical infarct, hemorrhage, or mass lesion is present. Skin staples are present within the right occipital scalp. There is a left supraorbital scalp laceration. There is no underlying fracture. The paranasal sinuses and mastoid air cells are clear. Atherosclerotic calcifications are noted within the cavernous carotid arteries.  CT CERVICAL SPINE FINDINGS  The cervical spine is imaged from the skullbase through T1-2. Vertebral body heights are maintained. Slight degenerative anterolisthesis is present at C4-5. Alignment is otherwise anatomic. There is chronic loss of disc height at C5-6 and C6-7 with uncovertebral spurring at both levels. Osseous foraminal narrowing is present bilaterally. The soft tissues demonstrate atherosclerotic calcifications within the cavernous  carotid arteries bilaterally. The lung apices are clear.  IMPRESSION: 1. Right occipital and left supraorbital scalp lacerations without underlying fractures. 2. Stable atrophy and white matter disease. 3. No acute intracranial abnormality. 4. Moderate degenerative changes within the cervical spine. 5. No acute fracture or traumatic subluxation in the cervical spine.   Electronically Signed   By: Gennette Pac M.D.   On: 06/17/2014 20:28    EKG Interpretation None      MDM   Final diagnoses:  Forehead laceration, initial encounter  Scalp laceration, initial encounter  Head injury, initial encounter    89yM fall from garbage can. Imaging w/o serious acute traumatic injury. Scalp lacs repaired. Nonfocal neuro exam.     Raeford Razor, MD 06/24/14 [phone]

## 2014-06-17 NOTE — ED Notes (Signed)
MD at bedside to suture patients forehead.  Pt provided with pain medication and warm blankets.  Explained to visitor I couldn't give medication until there was an order and the order was just placed.

## 2014-06-17 NOTE — Discharge Instructions (Signed)
Facial Laceration ° A facial laceration is a cut on the face. These injuries can be painful and cause bleeding. Lacerations usually heal quickly, but they need special care to reduce scarring. °DIAGNOSIS  °Your health care provider will take a medical history, ask for details about how the injury occurred, and examine the wound to determine how deep the cut is. °TREATMENT  °Some facial lacerations may not require closure. Others may not be able to be closed because of an increased risk of infection. The risk of infection and the chance for successful closure will depend on various factors, including the amount of time since the injury occurred. °The wound may be cleaned to help prevent infection. If closure is appropriate, pain medicines may be given if needed. Your health care provider will use stitches (sutures), wound glue (adhesive), or skin adhesive strips to repair the laceration. These tools bring the skin edges together to allow for faster healing and a better cosmetic outcome. If needed, you may also be given a tetanus shot. °HOME CARE INSTRUCTIONS °· Only take over-the-counter or prescription medicines as directed by your health care provider. °· Follow your health care provider's instructions for wound care. These instructions will vary depending on the technique used for closing the wound. °For Sutures: °· Keep the wound clean and dry.   °· If you were given a bandage (dressing), you should change it at least once a day. Also change the dressing if it becomes wet or dirty, or as directed by your health care provider.   °· Wash the wound with soap and water 2 times a day. Rinse the wound off with water to remove all soap. Pat the wound dry with a clean towel.   °· After cleaning, apply a thin layer of the antibiotic ointment recommended by your health care provider. This will help prevent infection and keep the dressing from sticking.   °· You may shower as usual after the first 24 hours. Do not soak the  wound in water until the sutures are removed.   °· Get your sutures removed as directed by your health care provider. With facial lacerations, sutures should usually be taken out after 4-5 days to avoid stitch marks.   °· Wait a few days after your sutures are removed before applying any makeup. °For Skin Adhesive Strips: °· Keep the wound clean and dry.   °· Do not get the skin adhesive strips wet. You may bathe carefully, using caution to keep the wound dry.   °· If the wound gets wet, pat it dry with a clean towel.   °· Skin adhesive strips will fall off on their own. You may trim the strips as the wound heals. Do not remove skin adhesive strips that are still stuck to the wound. They will fall off in time.   °For Wound Adhesive: °· You may briefly wet your wound in the shower or bath. Do not soak or scrub the wound. Do not swim. Avoid periods of heavy sweating until the skin adhesive has fallen off on its own. After showering or bathing, gently pat the wound dry with a clean towel.   °· Do not apply liquid medicine, cream medicine, ointment medicine, or makeup to your wound while the skin adhesive is in place. This may loosen the film before your wound is healed.   °· If a dressing is placed over the wound, be careful not to apply tape directly over the skin adhesive. This may cause the adhesive to be pulled off before the wound is healed.   °· Avoid   prolonged exposure to sunlight or tanning lamps while the skin adhesive is in place. °· The skin adhesive will usually remain in place for 5-10 days, then naturally fall off the skin. Do not pick at the adhesive film.   °After Healing: °Once the wound has healed, cover the wound with sunscreen during the day for 1 full year. This can help minimize scarring. Exposure to ultraviolet light in the first year will darken the scar. It can take 1-2 years for the scar to lose its redness and to heal completely.  °SEEK IMMEDIATE MEDICAL CARE IF: °· You have redness, pain, or  swelling around the wound.   °· You see a yellowish-white fluid (pus) coming from the wound.   °· You have chills or a fever.   °MAKE SURE YOU: °· Understand these instructions. °· Will watch your condition. °· Will get help right away if you are not doing well or get worse. °Document Released: 10/04/2004 Document Revised: 06/17/2013 Document Reviewed: 04/09/2013 °ExitCare® Patient Information ©2015 [employer]. This information is not intended to replace advice given to you by your health care provider. Make sure you discuss any questions you have with your health care provider. ° ° °Emergency Department Resource Guide °1) Find a Doctor and Pay Out of Pocket °Although you won't have to find out who is covered by your insurance plan, it is a good idea to ask around and get recommendations. You will then need to call the office and see if the doctor you have chosen will accept you as a new patient and what types of options they offer for patients who are self-pay. Some doctors offer discounts or will set up payment plans for their patients who do not have insurance, but you will need to ask so you aren't surprised when you get to your appointment. ° °2) Contact Your Local Health Department °Not all health departments have doctors that can see patients for sick visits, but many do, so it is worth a call to see if yours does. If you don't know where your local health department is, you can check in your phone book. The CDC also has a tool to help you locate your state's health department, and many state websites also have listings of all of their local health departments. ° °3) Find a Walk-in Clinic °If your illness is not likely to be very severe or complicated, you may want to try a walk in clinic. These are popping up all over the country in pharmacies, drugstores, and shopping centers. They're usually staffed by nurse practitioners or physician assistants that have been trained to treat common illnesses and  complaints. They're usually fairly quick and inexpensive. However, if you have serious medical issues or chronic medical problems, these are probably not your best option. ° °No Primary Care Doctor: °- Call [facility] at  832-8000 - they can help you locate a primary care doctor that  accepts your insurance, provides certain services, etc. °- Physician Referral Service- 1-800-533-3463 ° °Chronic Pain Problems: °Organization         Address  Phone   Notes  °[facility]  (336) 297-2271 Patients need to be referred by their primary care doctor.  ° °Medication Assistance: °Organization         Address  Phone   Notes  °[place] [facility] [address] °[place], [place] [address] (336) 641-8030 --Must be a resident of [place] °-- Must have NO insurance coverage whatsoever (no Medicaid/ Medicare,   etc.) °-- The pt. MUST have a primary care doctor that directs their care regularly and follows them in the community °  °[facility]  (866) 331-1348   °[employer]  (888) 892-1162   ° °Agencies that provide inexpensive medical care: °Organization         Address  Phone   Notes  °[facility]  (336) 832-8035   °[facility]    (336) 832-7272   °[facility] [address] °[place], [place] [address] (336) 832-4777   °[facility] [address], °[place] (336) 271-4999   °[facility]    (336) 373-0678   °[facility]    (336) 272-1050   °[facility] ° [address], [place] Phone:  (336) 832-4444, Fax:  (336) 832-4440 Hours of Operation:  9 am - 6 pm, M-F.  Also accepts Medicaid/Medicare and self-pay.  °[facility] ° [address], [place] Phone: (336) 832-3150, Fax: (336) [phone]. Hours of Operation:  8:30 am - 5:30 pm, M-F.  Also accepts Medicaid and self-pay.  °[facility] [place] [address], [place] Phone: (336) 878-6027   °[facility] [address], [place], [place] (336)723-1848, Ext. 123 Mondays & Thursdays: 7-9 AM.  First 15 patients are seen on a first come, first serve basis. °  ° °Medicaid-accepting [place] Providers: ° °Organization         Address  Phone   Notes  °[facility] [address], [place] (336) 641-2100 Also accepts self-pay patients.  °[facility] [address], [place] ° (336) 856-9996   °[facility] [address], [place] (336) 288-8857   °[facility] [address] [place] [address], [place] (336) 299-7000   °Veita Bland [address], [place]  ° (336) 373-1557 Only accepts [place] Access Medicaid patients after they have their name applied to their card.  ° °Self-Pay (no insurance) in [place]: ° °Organization         Address  Phone   Notes  °Sickle Cell Patients, [facility] [address], [place] (336) 832-1970   °[facility] [address], [place] (336) 832-4400   °[facility] ° [address], [place] (336) 992-4800   °[facility]/Dr. Osei-Bonsu ° [address] [place] [address], [place] or [address], [place] (336) [phone] Phone number for both [place] and [place] locations is the same.  °[facility] [address], [place] (336) 299-0000   °[facility] [address] [place] [address],  or [address] (336) 852-7530 °(336) 878-2260   °[facility] [address],  (336) 350-1642, phone; (336) 294-5005, fax Sees patients 1st and 3rd Saturday of every month.  Must not qualify for public or private insurance (i.e. Medicaid, Medicare, [place] Health Choice, Veterans' Benefits) • Household income should be no more than 200% of the poverty level  •The clinic cannot treat you if you are pregnant or think you are pregnant • Sexually transmitted diseases are not treated at the clinic.  ° ° °Dental Care: °Organization         Address  Phone  Notes  °[facility] [address], [place] (336) 641-6152 Accepts children up to age 21 who are enrolled in Medicaid or [place] Health Choice; pregnant women with a Medicaid card; and children who have applied for Medicaid or [place] Health Choice, but were declined, whose parents can pay a reduced fee at time of service.  °[facility] [place]  [address], [place] (336) 641-7733 Accepts children up to age 21 who are enrolled in Medicaid or [place] Health Choice; pregnant women with a Medicaid card; and children who have applied for Medicaid or [place] Health Choice, but were declined, whose parents can pay a reduced fee at time of service.  °[facility] ° [address], [place] (336) 641-4533 Patients are seen by appointment only. Walk-ins are not accepted. [facility] will see patients 18 years of age and older. °Monday - Tuesday (8am-5pm) °Most Wednesdays (8:30-5pm) °$30 per visit, cash only  °[facility] ° [address], [place] (336) 641-4533 Patients are seen by appointment only. Walk-ins are not accepted. [facility] will see patients 18 years of age and older. °One Wednesday Evening (Monthly: Volunteer Based).  $30 per visit, cash only  °[facility]  (919) 537-3737 for adults; Children under age 4, call Graduate Pediatric Dentistry at ([phone]) [phone]. Children aged 4-14, please call ([phone]) [phone] to request a pediatric application. ° Dental services are provided in all areas of dental care including fillings, crowns and bridges, complete and partial dentures, implants, gum treatment, root canals, and extractions. Preventive care is  also provided. Treatment is provided to both adults and children. °Patients are selected via a lottery and there is often a waiting list. °  °[facility] [address], °[place] ° (336) 763-8833 [email or web address] °  °[facility] [address], [place], [place] (336)723-1848, Ext. 123 Second and Fourth Thursday of each month, opens at 6:30 AM; Clinic ends at 9 AM.  Patients are seen on a first-come first-served basis, and a limited number are seen during each clinic.  ° °[facility] ° [address], [place], [place] (336) 723-7904   Eligibility Requirements °You must have lived in [place], [place], or [place] counties for at least the last three months. °  You cannot be eligible for state or federal sponsored healthcare insurance, including Veterans Administration, Medicaid, or Medicare. °  You generally cannot be eligible for healthcare insurance through your employer.  °  How to apply: °Eligibility screenings are held every Tuesday and Wednesday afternoon from 1:00 pm until 4:00 pm. You do not need an appointment for the interview!  °[facility] [address], [place], [place] 336-631-2330   °[facility]  336-342-8273   °[facility]  336-703-3100   °[facility]  336-570-6415   ° °Behavioral Health Resources in the Community: °Intensive Outpatient Programs °Organization         Address  Phone  Notes  °[facility] [address], [place], [place] 336-878-6098   °[facility] [address], [place], [place] 336-832-9800   °[facility] [address], [place], [place] ° 336-882-2125   °[facility] [address],  °[place],  1-800-853-5163 or 336-641-4981   °Substance Abuse Resources °Organization         Address  Phone  Notes  °  [facility]  336-882-2125   °[facility]  336-784-9470   °[facility]  336-285-9073   °[facility]  336-845-3988   °Residential & Outpatient Substance Abuse Program  1-800-659-3381   °Psychological Services °Organization         Address  Phone  Notes  °[facility]  336- 832-9600   °[facility]  336- 378-7881   °[facility] [address], [place] 1-800-853-5163 or 336-641-4981   ° °Mobile Crisis Teams °Organization         Address  Phone  Notes  °[facility], Mobile Crisis Care Unit  1-877-626-1772   °Assertive °[facility] ° [address] [place], [place] 336-834-9664   °Sharon DeEsch [address] °[place] 336-554-5454   ° °Self-Help/Support Groups °Organization         Address  Phone             Notes  °[facility] - variety of support groups  336- 373-1402 Call for more information  °Narcotics Anonymous (NA), [facility] [address], °[place]  2 meetings at this location  ° °Residential Treatment Programs °Organization         Address  Phone  Notes  °[facility] [address],    °[place]  1-866-801-8205   °[facility] ° [address], [place], [place] 704-293-8524   °[facility] [address], [place] 336-845-3988 Admissions: 8am-3pm M-F  °[facility] [address]    °[place], [place] 336-841-1104   °[facility] [address], [place], [place] 336-379-7146   °[facility] [address]  °[place], [place] 336-285-9073   °[facility] Intensive Outpatient [address], [place], [place] 336-852-3033   °[facility] [address]  °[place], [place] 1-877-615-2722 or 336-784-9470   °[facility] [address] [place], [place] 336-227-7417 Accepts Medicaid  °[facility] [address]  °[place] 1-800-659-3381  Substance Abuse/Addiction Treatment  ° °[place] Behavioral Health Resources °Organization         Address  Phone  Notes  °[facility]  (888) 581-9988   °Julie Brannon, PhD [address] [place], [place]   (336) 349-5553 or (336) 951-0000   °[facility]   [address] °[place], [place] (336) 349-4454   °[facility] [address], [place], [place] (336) 342-8316 Insurance/Medicaid/sponsorship through [facility]  °[facility] [address]                                    [place], [place] (336) 342-8316 Therapy/tele-psych/case  °[facility] [address]  ° [place], [place] (336) 349-2233    °Dr. Arfeen  (336) 349-4544   °[facility] [place]  [employer] [place] [facility] 1) [address], [place] °2) [address], [place] °3)  [address], [place] (336) 349-3220 °(336) 342-7768 ° °(336) 342-8140   °[place] [facility] (336) 342-1394 or (336) 342-3537 (After Hours)    ° ° ° °

## 2014-06-17 NOTE — ED Notes (Addendum)
Per neighbor/caretaker pt lives alone at home. Pt locked self out of house; attempt to climb on trashcan to get through window resulting in fall around 1300 today. Pt has 4 cm laceration to frontal head and 3 cm laceration to right posterior portion of head. Pt alert and oriented x4. Pt denies LOC, CP, SOB, or blood thinners. Caretaker reports right black eye from fall in grass yesterday. Pt also reports right hip pain.

## 2014-06-25 ENCOUNTER — Emergency Department (HOSPITAL_COMMUNITY): Payer: Medicare Other

## 2014-06-25 ENCOUNTER — Encounter (HOSPITAL_COMMUNITY): Payer: Self-pay | Admitting: Emergency Medicine

## 2014-06-25 ENCOUNTER — Inpatient Hospital Stay (HOSPITAL_COMMUNITY)
Admission: EM | Admit: 2014-06-25 | Discharge: 2014-06-29 | DRG: 552 | Disposition: A | Discharge status: Skilled Nursing Facility | Payer: Medicare Other | Attending: Family Medicine | Admitting: Family Medicine

## 2014-06-25 DIAGNOSIS — L03113 Cellulitis of right upper limb: Secondary | ICD-10-CM | POA: Diagnosis present

## 2014-06-25 DIAGNOSIS — I4891 Unspecified atrial fibrillation: Secondary | ICD-10-CM | POA: Diagnosis present

## 2014-06-25 DIAGNOSIS — Z87891 Personal history of nicotine dependence: Secondary | ICD-10-CM

## 2014-06-25 DIAGNOSIS — K265 Chronic or unspecified duodenal ulcer with perforation: Secondary | ICD-10-CM

## 2014-06-25 DIAGNOSIS — IMO0002 Reserved for concepts with insufficient information to code with codable children: Secondary | ICD-10-CM

## 2014-06-25 DIAGNOSIS — M549 Dorsalgia, unspecified: Secondary | ICD-10-CM | POA: Diagnosis not present

## 2014-06-25 DIAGNOSIS — S32000A Wedge compression fracture of unspecified lumbar vertebra, initial encounter for closed fracture: Secondary | ICD-10-CM | POA: Diagnosis present

## 2014-06-25 DIAGNOSIS — Z8249 Family history of ischemic heart disease and other diseases of the circulatory system: Secondary | ICD-10-CM

## 2014-06-25 DIAGNOSIS — S32029A Unspecified fracture of second lumbar vertebra, initial encounter for closed fracture: Secondary | ICD-10-CM | POA: Diagnosis not present

## 2014-06-25 DIAGNOSIS — G309 Alzheimer's disease, unspecified: Secondary | ICD-10-CM | POA: Diagnosis present

## 2014-06-25 DIAGNOSIS — I2510 Atherosclerotic heart disease of native coronary artery without angina pectoris: Secondary | ICD-10-CM | POA: Diagnosis present

## 2014-06-25 DIAGNOSIS — W19XXXA Unspecified fall, initial encounter: Secondary | ICD-10-CM | POA: Diagnosis present

## 2014-06-25 DIAGNOSIS — I10 Essential (primary) hypertension: Secondary | ICD-10-CM | POA: Diagnosis present

## 2014-06-25 DIAGNOSIS — F0280 Dementia in other diseases classified elsewhere without behavioral disturbance: Secondary | ICD-10-CM | POA: Diagnosis present

## 2014-06-25 DIAGNOSIS — Y92018 Other place in single-family (private) house as the place of occurrence of the external cause: Secondary | ICD-10-CM

## 2014-06-25 DIAGNOSIS — E876 Hypokalemia: Secondary | ICD-10-CM | POA: Diagnosis present

## 2014-06-25 DIAGNOSIS — E785 Hyperlipidemia, unspecified: Secondary | ICD-10-CM

## 2014-06-25 DIAGNOSIS — I480 Paroxysmal atrial fibrillation: Secondary | ICD-10-CM

## 2014-06-25 DIAGNOSIS — Z950 Presence of cardiac pacemaker: Secondary | ICD-10-CM

## 2014-06-25 DIAGNOSIS — M25559 Pain in unspecified hip: Secondary | ICD-10-CM

## 2014-06-25 HISTORY — DX: Hypothyroidism, unspecified: E03.9

## 2014-06-25 HISTORY — DX: Reserved for concepts with insufficient information to code with codable children: IMO0002

## 2014-06-25 MED ORDER — HYDROCODONE-ACETAMINOPHEN 5-325 MG PO TABS
1.0000 | ORAL_TABLET | Freq: Once | ORAL | Status: AC
  Administered 2014-06-25: 1 via ORAL
  Filled 2014-06-25: qty 1

## 2014-06-25 NOTE — ED Provider Notes (Signed)
CSN: [id number]     Arrival date & time 06/25/14  1712 History   First MD Initiated Contact with Patient 06/25/14 1725     Chief Complaint  Patient presents with  . Leg Pain  . Suture / Staple Removal     (Consider location/radiation/quality/duration/timing/severity/associated sxs/prior Treatment) HPI  Devin Clark is a 80 y.o. male who presents for evaluation of right thigh pain, associated with walking, sitting, and moving. Pain started after he fell one week ago, after he left the ED following an evaluation. There has been no additional trauma. He denies headache, neck pain, upper back, pain, nausea, vomiting, weakness, or dizziness. He's not gotten the sutures removed from his facial laceration. He is here with a neighbor, who helps in each day. There are no other known modifying factors.   Past Medical History  Diagnosis Date  . Hypertension   . CAD (coronary artery disease)   . Stented coronary artery 1998  . Sinus node dysfunction     St.Jude pacemaker 06/05/2011  . Atrial fibrillation   . Dyslipidemia    Past Surgical History  Procedure Laterality Date  . Cataract extraction, bilateral  2011  . Patch perforated duodenal ulcer  05/28/11    Cheree DittoGraham patch  . Pacemaker insertion  06/05/11    St.Jude Accent DR  . Chest tube insertion  06/06/11    right iatrogenic pneumothorax  . Coronary angioplasty with stent placement  12/30/1996    stent RCA  . Cardiac catheterization  03/07/2000    100% CX,widely patent stent in RCA  . Cardiac catheterization  03/26/2007    100% CX,widely patnet stent in RCA  . Nm myoview ltd  03/04/2007    High risk,mild-mod anterolateral/inferolateral ischemia   Family History  Problem Relation Age of Onset  . Heart failure Mother   . Heart failure Father   . Heart failure Sister    History  Substance Use Topics  . Smoking status: Former Smoker -- 1 years    Types: Cigarettes  . Smokeless tobacco: Never Used  . Alcohol Use: No    Review  of Systems  All other systems reviewed and are negative.     Allergies  Review of patient's allergies indicates no known allergies.  Home Medications   Prior to Admission medications   Medication Sig Start Date End Date Taking? Authorizing Provider  Cyanocobalamin (VITAMIN B-12 PO) Take 1 tablet by mouth daily.   Yes Historical Provider, MD  enalapril (VASOTEC) 2.5 MG tablet Take 2.5 mg by mouth daily.   Yes Historical Provider, MD  fish oil-omega-3 fatty acids 1000 MG capsule Take 2 g by mouth daily.   Yes Historical Provider, MD  levothyroxine (SYNTHROID, LEVOTHROID) 50 MCG tablet Take 50 mcg by mouth daily before breakfast.   Yes Historical Provider, MD  Menthol, Topical Analgesic, (BENGAY EX) Apply 1 application topically as needed. To shoulder forpain   Yes Historical Provider, MD  metoprolol tartrate (LOPRESSOR) 25 MG tablet Take 25 mg by mouth daily.   Yes Historical Provider, MD  Multiple Vitamin (MULTIVITAMIN WITH MINERALS) TABS Take 1 tablet by mouth daily.   Yes Historical Provider, MD  Neomycin-Bacitracin-Polymyxin (NEOSPORIN EX) Apply 1 application topically as needed. To shoulder   Yes Historical Provider, MD  nitroGLYCERIN (NITROSTAT) 0.4 MG SL tablet Place 1 tablet (0.4 mg total) under the tongue every 5 (five) minutes as needed for chest pain. 01/29/13  Yes Chrystie NoseKenneth C. Hilty, MD  omeprazole (PRILOSEC) 20 MG capsule Take 20 mg  by mouth daily.   Yes Historical Provider, MD  simvastatin (ZOCOR) 80 MG tablet Take 1 tablet (80 mg total) by mouth at bedtime. 11/09/13  Yes Mihai Croitoru, MD  Thiamine HCl (VITAMIN B-1 PO) Take 1 tablet by mouth daily.   Yes Historical Provider, MD  vitamin C (ASCORBIC ACID) 500 MG tablet Take 500 mg by mouth daily.   Yes Historical Provider, MD  vitamin E 400 UNIT capsule Take 400 Units by mouth daily.   Yes Historical Provider, MD   BP 124/89  Pulse 65  Temp(Src) 97.6 F (36.4 C) (Oral)  Resp 18  SpO2 100% Physical Exam  Nursing note and  vitals reviewed. Constitutional: He is oriented to person, place, and time. He appears well-developed and well-nourished.  HENT:  Head: Normocephalic and atraumatic.  Right Ear: External ear normal.  Left Ear: External ear normal.  Eyes: Conjunctivae and EOM are normal. Pupils are equal, round, and reactive to light.  Neck: Normal range of motion and phonation normal. Neck supple.  Cardiovascular: Normal rate, regular rhythm and normal heart sounds.   Pulmonary/Chest: Effort normal and breath sounds normal. He exhibits no bony tenderness.  Abdominal: Soft. There is no tenderness.  Musculoskeletal:  Normal active and passive range of motion right hip. I attempt at sitting in a stretcher, he has severe lower back, pain that radiates to his right anterior thigh  Neurological: He is alert and oriented to person, place, and time. No cranial nerve deficit or sensory deficit. He exhibits normal muscle tone. Coordination normal.  Skin: Skin is warm, dry and intact.  Psychiatric: He has a normal mood and affect. His behavior is normal. Judgment and thought content normal.    ED Course  Procedures (including critical care time)  Medications - No data to display  Patient Vitals for the past 24 hrs:  BP Temp Temp src Pulse Resp SpO2  06/25/14 1720 124/89 mmHg 97.6 F (36.4 C) Oral 65 18 100 %   22:20- Discussed with Dr. Dutch QuintPoole- He states pt. Can be treated with lumbar support, and f/u in office with him.  10:25 PM Reevaluation with update and discussion. After initial assessment and treatment, an updated evaluation reveals he is having mild low back pain now, after moving in the bed.Devin Clark. Devin Clark   12:45 AM- after application of lumbar corset, patient was unable to ambulate secondary to back and leg right pain.  Will consult hospitalist, to admit for physical therapy, and stabilization.  12:46 AM-Consult complete with Dr. Selena BattenKim. Patient case explained and discussed. He agrees to admit  patient for further evaluation and treatment. Call ended at 01:07  Labs Review Labs Reviewed - No data to display  Imaging Review No results found.   EKG Interpretation   Date/Time:  Friday June 25 2014 17:18:47 EDT Ventricular Rate:  69 PR Interval:  229 QRS Duration: 148 QT Interval:  443 QTC Calculation: 475 R Axis:   -65 Text Interpretation:  Sinus rhythm Prolonged PR interval RBBB and LAFB  Since last tracing P wave is now present Confirmed by Fenton Candee  MD, Gregroy Dombkowski  ([id number]) on 06/25/2014 5:28:18 PM      MDM   Final diagnoses:  Compression fracture of lumbar vertebra, closed, initial encounter    Lumbar compression fracture shin into the spinal  Nursing Notes Reviewed/ Care Coordinated, and agree without changes. Applicable Imaging Reviewed.  Interpretation of Laboratory Data incorporated into ED treatment  Plan: Admit  Flint MelterElliott Clark Avannah Decker, MD 06/26/14 [phone]

## 2014-06-25 NOTE — ED Notes (Addendum)
Per EMS, pt has had pain in his right thigh since he fell on 10/8. Pt is unable to walk now due to the pain in his leg; pt is usually ambulatory. Pt has been rubbing Bengay on his leg, which has not provided relief. Pt also has sutures from the fall that occurred on 10/8, which he was planning to have removed today. Pt's neighbor called EMS due to concern that patient did not receive an X-ray of his "right side".

## 2014-06-26 ENCOUNTER — Encounter (HOSPITAL_COMMUNITY): Payer: Self-pay | Admitting: Internal Medicine

## 2014-06-26 DIAGNOSIS — I480 Paroxysmal atrial fibrillation: Secondary | ICD-10-CM

## 2014-06-26 DIAGNOSIS — E785 Hyperlipidemia, unspecified: Secondary | ICD-10-CM | POA: Diagnosis not present

## 2014-06-26 DIAGNOSIS — S32029A Unspecified fracture of second lumbar vertebra, initial encounter for closed fracture: Secondary | ICD-10-CM | POA: Diagnosis present

## 2014-06-26 DIAGNOSIS — I2510 Atherosclerotic heart disease of native coronary artery without angina pectoris: Secondary | ICD-10-CM

## 2014-06-26 DIAGNOSIS — W19XXXA Unspecified fall, initial encounter: Secondary | ICD-10-CM | POA: Diagnosis not present

## 2014-06-26 DIAGNOSIS — Z8249 Family history of ischemic heart disease and other diseases of the circulatory system: Secondary | ICD-10-CM | POA: Diagnosis not present

## 2014-06-26 DIAGNOSIS — Z87891 Personal history of nicotine dependence: Secondary | ICD-10-CM | POA: Diagnosis not present

## 2014-06-26 DIAGNOSIS — E876 Hypokalemia: Secondary | ICD-10-CM | POA: Diagnosis not present

## 2014-06-26 DIAGNOSIS — I4891 Unspecified atrial fibrillation: Secondary | ICD-10-CM | POA: Diagnosis not present

## 2014-06-26 DIAGNOSIS — L03113 Cellulitis of right upper limb: Secondary | ICD-10-CM | POA: Diagnosis not present

## 2014-06-26 DIAGNOSIS — M549 Dorsalgia, unspecified: Secondary | ICD-10-CM | POA: Diagnosis present

## 2014-06-26 DIAGNOSIS — F0280 Dementia in other diseases classified elsewhere without behavioral disturbance: Secondary | ICD-10-CM | POA: Diagnosis present

## 2014-06-26 DIAGNOSIS — Y92018 Other place in single-family (private) house as the place of occurrence of the external cause: Secondary | ICD-10-CM | POA: Diagnosis not present

## 2014-06-26 DIAGNOSIS — I10 Essential (primary) hypertension: Secondary | ICD-10-CM | POA: Diagnosis not present

## 2014-06-26 DIAGNOSIS — G309 Alzheimer's disease, unspecified: Secondary | ICD-10-CM | POA: Diagnosis not present

## 2014-06-26 DIAGNOSIS — S32000A Wedge compression fracture of unspecified lumbar vertebra, initial encounter for closed fracture: Secondary | ICD-10-CM | POA: Diagnosis present

## 2014-06-26 DIAGNOSIS — IMO0002 Reserved for concepts with insufficient information to code with codable children: Secondary | ICD-10-CM

## 2014-06-26 LAB — I-STAT CHEM 8, ED
BUN: 18 mg/dL (ref 6–23)
CREATININE: 0.9 mg/dL (ref 0.50–1.35)
Calcium, Ion: 1.11 mmol/L — ABNORMAL LOW (ref 1.13–1.30)
Chloride: 101 mEq/L (ref 96–112)
Glucose, Bld: 122 mg/dL — ABNORMAL HIGH (ref 70–99)
HCT: 39 % (ref 39.0–52.0)
Hemoglobin: 13.3 g/dL (ref 13.0–17.0)
Potassium: 3.5 mEq/L — ABNORMAL LOW (ref 3.7–5.3)
Sodium: 133 mEq/L — ABNORMAL LOW (ref 137–147)
TCO2: 24 mmol/L (ref 0–100)

## 2014-06-26 LAB — CBC
HCT: 37.5 % — ABNORMAL LOW (ref 39.0–52.0)
Hemoglobin: 12.9 g/dL — ABNORMAL LOW (ref 13.0–17.0)
MCH: 31.2 pg (ref 26.0–34.0)
MCHC: 34.4 g/dL (ref 30.0–36.0)
MCV: 90.8 fL (ref 78.0–100.0)
Platelets: 306 10*3/uL (ref 150–400)
RBC: 4.13 MIL/uL — AB (ref 4.22–5.81)
RDW: 13.3 % (ref 11.5–15.5)
WBC: 9.4 10*3/uL (ref 4.0–10.5)

## 2014-06-26 LAB — CBC WITH DIFFERENTIAL/PLATELET
Basophils Absolute: 0 10*3/uL (ref 0.0–0.1)
Basophils Relative: 0 % (ref 0–1)
Eosinophils Absolute: 0.2 10*3/uL (ref 0.0–0.7)
Eosinophils Relative: 2 % (ref 0–5)
HCT: 34.3 % — ABNORMAL LOW (ref 39.0–52.0)
Hemoglobin: 11.6 g/dL — ABNORMAL LOW (ref 13.0–17.0)
LYMPHS ABS: 1.2 10*3/uL (ref 0.7–4.0)
Lymphocytes Relative: 14 % (ref 12–46)
MCH: 30.7 pg (ref 26.0–34.0)
MCHC: 33.8 g/dL (ref 30.0–36.0)
MCV: 90.7 fL (ref 78.0–100.0)
MONO ABS: 0.7 10*3/uL (ref 0.1–1.0)
Monocytes Relative: 8 % (ref 3–12)
NEUTROS ABS: 7 10*3/uL (ref 1.7–7.7)
NEUTROS PCT: 76 % (ref 43–77)
Platelets: 304 10*3/uL (ref 150–400)
RBC: 3.78 MIL/uL — AB (ref 4.22–5.81)
RDW: 13.4 % (ref 11.5–15.5)
WBC: 9.2 10*3/uL (ref 4.0–10.5)

## 2014-06-26 LAB — COMPREHENSIVE METABOLIC PANEL
ALK PHOS: 84 U/L (ref 39–117)
ALT: 19 U/L (ref 0–53)
AST: 24 U/L (ref 0–37)
Albumin: 2.9 g/dL — ABNORMAL LOW (ref 3.5–5.2)
Anion gap: 12 (ref 5–15)
BILIRUBIN TOTAL: 0.5 mg/dL (ref 0.3–1.2)
BUN: 17 mg/dL (ref 6–23)
CO2: 23 meq/L (ref 19–32)
Calcium: 8.4 mg/dL (ref 8.4–10.5)
Chloride: 100 mEq/L (ref 96–112)
Creatinine, Ser: 0.85 mg/dL (ref 0.50–1.35)
GFR calc Af Amer: 86 mL/min — ABNORMAL LOW (ref >90)
GFR, EST NON AFRICAN AMERICAN: 74 mL/min — AB (ref >90)
GLUCOSE: 93 mg/dL (ref 70–99)
POTASSIUM: 3.9 meq/L (ref 3.7–5.3)
Sodium: 135 mEq/L — ABNORMAL LOW (ref 137–147)
Total Protein: 5.9 g/dL — ABNORMAL LOW (ref 6.0–8.3)

## 2014-06-26 LAB — HEMOGLOBIN A1C
Hgb A1c MFr Bld: 6 % — ABNORMAL HIGH (ref <5.7)
MEAN PLASMA GLUCOSE: 126 mg/dL — AB (ref <117)

## 2014-06-26 LAB — MAGNESIUM: MAGNESIUM: 2 mg/dL (ref 1.5–2.5)

## 2014-06-26 LAB — TESTOSTERONE: TESTOSTERONE: 157 ng/dL — AB (ref 300–890)

## 2014-06-26 LAB — PHOSPHORUS: Phosphorus: 3.1 mg/dL (ref 2.3–4.6)

## 2014-06-26 LAB — TSH: TSH: 4.38 u[IU]/mL (ref 0.350–4.500)

## 2014-06-26 MED ORDER — VITAMIN C 500 MG PO TABS
500.0000 mg | ORAL_TABLET | Freq: Every day | ORAL | Status: DC
  Administered 2014-06-26 – 2014-06-29 (×4): 500 mg via ORAL
  Filled 2014-06-26 (×4): qty 1

## 2014-06-26 MED ORDER — VITAMIN B-1 100 MG PO TABS
100.0000 mg | ORAL_TABLET | Freq: Every day | ORAL | Status: DC
  Administered 2014-06-26 – 2014-06-29 (×4): 100 mg via ORAL
  Filled 2014-06-26 (×4): qty 1

## 2014-06-26 MED ORDER — PNEUMOCOCCAL VAC POLYVALENT 25 MCG/0.5ML IJ INJ
0.5000 mL | INJECTION | INTRAMUSCULAR | Status: AC
  Administered 2014-06-27: 0.5 mL via INTRAMUSCULAR
  Filled 2014-06-26 (×2): qty 0.5

## 2014-06-26 MED ORDER — METOPROLOL TARTRATE 25 MG PO TABS
25.0000 mg | ORAL_TABLET | Freq: Every day | ORAL | Status: DC
  Administered 2014-06-26 – 2014-06-29 (×4): 25 mg via ORAL
  Filled 2014-06-26 (×4): qty 1

## 2014-06-26 MED ORDER — VITAMIN B-12 1000 MCG PO TABS
1000.0000 ug | ORAL_TABLET | Freq: Every day | ORAL | Status: DC
  Administered 2014-06-26 – 2014-06-29 (×4): 1000 ug via ORAL
  Filled 2014-06-26 (×4): qty 1

## 2014-06-26 MED ORDER — ADULT MULTIVITAMIN W/MINERALS CH
1.0000 | ORAL_TABLET | Freq: Every day | ORAL | Status: DC
  Administered 2014-06-26 – 2014-06-29 (×4): 1 via ORAL
  Filled 2014-06-26 (×4): qty 1

## 2014-06-26 MED ORDER — VITAMIN E 180 MG (400 UNIT) PO CAPS
400.0000 [IU] | ORAL_CAPSULE | Freq: Every day | ORAL | Status: DC
  Administered 2014-06-26 – 2014-06-29 (×4): 400 [IU] via ORAL
  Filled 2014-06-26 (×4): qty 1

## 2014-06-26 MED ORDER — SODIUM CHLORIDE 0.9 % IV SOLN: 250.0000 mL | INTRAVENOUS | Status: DC | PRN

## 2014-06-26 MED ORDER — LEVOTHYROXINE SODIUM 50 MCG PO TABS
50.0000 ug | ORAL_TABLET | Freq: Every day | ORAL | Status: DC
  Administered 2014-06-26 – 2014-06-29 (×4): 50 ug via ORAL
  Filled 2014-06-26 (×6): qty 1

## 2014-06-26 MED ORDER — INFLUENZA VAC SPLIT QUAD 0.5 ML IM SUSY
0.5000 mL | PREFILLED_SYRINGE | INTRAMUSCULAR | Status: AC
  Administered 2014-06-27: 0.5 mL via INTRAMUSCULAR
  Filled 2014-06-26 (×2): qty 0.5

## 2014-06-26 MED ORDER — POTASSIUM CHLORIDE 10 MEQ/100ML IV SOLN
10.0000 meq | INTRAVENOUS | Status: AC
  Administered 2014-06-26 (×2): 10 meq via INTRAVENOUS
  Filled 2014-06-26 (×2): qty 100

## 2014-06-26 MED ORDER — SODIUM CHLORIDE 0.9 % IV SOLN
INTRAVENOUS | Status: AC
  Administered 2014-06-26 (×2): via INTRAVENOUS

## 2014-06-26 MED ORDER — SODIUM CHLORIDE 0.9 % IJ SOLN
3.0000 mL | Freq: Two times a day (BID) | INTRAMUSCULAR | Status: DC
  Administered 2014-06-26 – 2014-06-28 (×4): 3 mL via INTRAVENOUS

## 2014-06-26 MED ORDER — DOCUSATE SODIUM 100 MG PO CAPS
100.0000 mg | ORAL_CAPSULE | Freq: Two times a day (BID) | ORAL | Status: DC | PRN
  Filled 2014-06-26: qty 1

## 2014-06-26 MED ORDER — PANTOPRAZOLE SODIUM 40 MG PO TBEC
40.0000 mg | DELAYED_RELEASE_TABLET | Freq: Every day | ORAL | Status: DC
  Administered 2014-06-26 – 2014-06-29 (×4): 40 mg via ORAL
  Filled 2014-06-26 (×4): qty 1

## 2014-06-26 MED ORDER — CETYLPYRIDINIUM CHLORIDE 0.05 % MT LIQD
7.0000 mL | Freq: Two times a day (BID) | OROMUCOSAL | Status: DC
  Administered 2014-06-26 – 2014-06-29 (×5): 7 mL via OROMUCOSAL

## 2014-06-26 MED ORDER — SODIUM CHLORIDE 0.9 % IJ SOLN: 3.0000 mL | INTRAMUSCULAR | Status: DC | PRN

## 2014-06-26 MED ORDER — ATORVASTATIN CALCIUM 40 MG PO TABS
40.0000 mg | ORAL_TABLET | Freq: Every day | ORAL | Status: DC
  Administered 2014-06-26 – 2014-06-28 (×3): 40 mg via ORAL
  Filled 2014-06-26 (×4): qty 1

## 2014-06-26 MED ORDER — ENALAPRIL MALEATE 2.5 MG PO TABS
2.5000 mg | ORAL_TABLET | Freq: Every day | ORAL | Status: DC
  Administered 2014-06-26 – 2014-06-29 (×4): 2.5 mg via ORAL
  Filled 2014-06-26 (×4): qty 1

## 2014-06-26 MED ORDER — HYDROMORPHONE HCL 1 MG/ML IJ SOLN
0.5000 mg | INTRAMUSCULAR | Status: DC | PRN
  Administered 2014-06-26 – 2014-06-28 (×8): 0.5 mg via INTRAVENOUS
  Filled 2014-06-26 (×8): qty 1

## 2014-06-26 MED ORDER — ACETAMINOPHEN 650 MG RE SUPP
650.0000 mg | Freq: Four times a day (QID) | RECTAL | Status: DC | PRN
Start: 2014-06-26 — End: 2014-06-29

## 2014-06-26 MED ORDER — DIPHENHYDRAMINE HCL 50 MG/ML IJ SOLN: 25.0000 mg | Freq: Four times a day (QID) | INTRAMUSCULAR | Status: DC | PRN

## 2014-06-26 MED ORDER — ONDANSETRON HCL 4 MG/2ML IJ SOLN
4.0000 mg | Freq: Four times a day (QID) | INTRAMUSCULAR | Status: DC | PRN
Start: 2014-06-26 — End: 2014-06-29

## 2014-06-26 MED ORDER — ENOXAPARIN SODIUM 40 MG/0.4ML ~~LOC~~ SOLN
40.0000 mg | SUBCUTANEOUS | Status: DC
  Administered 2014-06-26 – 2014-06-29 (×4): 40 mg via SUBCUTANEOUS
  Filled 2014-06-26 (×4): qty 0.4

## 2014-06-26 MED ORDER — ACETAMINOPHEN 325 MG PO TABS: 650.0000 mg | ORAL_TABLET | Freq: Four times a day (QID) | ORAL | Status: DC | PRN

## 2014-06-26 NOTE — Evaluation (Signed)
Physical Therapy Evaluation Patient Details Name: Devin Clark MRN: [id number] DOB: 16-Jul-1921 Today's Date: 06/26/2014   History of Present Illness  80 yo male with HTN, hyperlipidemia, CAD, afib, pacemaker admitted after apparently falling on Thursday.  Pelvis xray negative for acute findings however CT lumbar spine: Acute appearing L2 compression fracture with bony retropulsion off the superior endplate resulting in moderate to moderately severe central canal stenosis.  Per RN, no plans for KP, and lumbar brace present in room.  Clinical Impression  Pt currently with functional limitations due to the deficits listed below (see PT Problem List).  Pt will benefit from skilled PT to increase their independence and safety with mobility to allow discharge to the venue listed below.  Pt reports being from home alone and has neighbor that acts as part caregiver he calls when he needs her and she also assists with meals.  Pt unable to tolerate sitting EOB due to pain today despite IV pain premedication.  Pt educated on log roll technique to assist with back pain.  Lumbar brace in bed with pt however not tightened and misplaced on pt so removed during attempt at bed mobility.  Pt aware to wear brace if sitting upright or OOB later with nsg if able.      Follow Up Recommendations SNF    Equipment Recommendations   (TBA if home)    Recommendations for Other Services       Precautions / Restrictions Precautions Precautions: Fall;Back Required Braces or Orthoses: Spinal Brace Spinal Brace: Lumbar corset Restrictions Weight Bearing Restrictions: No      Mobility  Bed Mobility Overal bed mobility: Needs Assistance;+2 for physical assistance Bed Mobility: Rolling;Sidelying to Sit;Sit to Sidelying Rolling: Mod assist Sidelying to sit: Total assist;+2 for physical assistance     Sit to sidelying: Total assist;+2 for physical assistance General bed mobility comments: pt given cues to  assist with rolling to Left side and reports little pain however upon sidelying to sit - pt with excruciating pain clutching R thigh and requesting assist back to supine, attempted to sit twice however pain limiting mobility  Transfers Overall transfer level:  (unable to perform due to pain with bed mobility)                  Ambulation/Gait                Stairs            Wheelchair Mobility    Modified Rankin (Stroke Patients Only)       Balance                                             Pertinent Vitals/Pain Pain Assessment: 0-10 Pain Score: 10-Worst pain ever Pain Location: R anterior thigh with movement Pain Descriptors / Indicators: Jabbing;Sharp Pain Intervention(s): Limited activity within patient's tolerance;Monitored during session;Premedicated before session    Home Living Family/patient expects to be discharged to:: Private residence Living Arrangements: Alone Available Help at Discharge: Neighbor;Available PRN/intermittently                  Prior Function Level of Independence: Independent               Hand Dominance        Extremity/Trunk Assessment  Lower Extremity Assessment: RLE deficits/detail RLE Deficits / Details: pt grabs right thigh during sidelying to sit, able to heel slide in bed without pain however       Communication   Communication: No difficulties  Cognition Arousal/Alertness: Awake/alert Behavior During Therapy: WFL for tasks assessed/performed Overall Cognitive Status: Within Functional Limits for tasks assessed (appears a little confused however given IV meds prior to session)                      General Comments      Exercises        Assessment/Plan    PT Assessment Patient needs continued PT services  PT Diagnosis Acute pain;Difficulty walking   PT Problem List Decreased strength;Decreased mobility;Decreased balance;Decreased safety  awareness;Decreased knowledge of use of DME;Pain  PT Treatment Interventions DME instruction;Gait training;Functional mobility training;Therapeutic activities;Therapeutic exercise;Patient/family education;Balance training   PT Goals (Current goals can be found in the Care Plan section) Acute Rehab PT Goals PT Goal Formulation: With patient Time For Goal Achievement: 07/03/14 Potential to Achieve Goals: Good    Frequency Min 3X/week   Barriers to discharge        Co-evaluation               End of Session   Activity Tolerance: Patient limited by pain Patient left: in bed;with call bell/phone within reach;with bed alarm set           Time: [id number] PT Time Calculation (min): 13 min   Charges:   PT Evaluation $Initial PT Evaluation Tier I: 1 Procedure PT Treatments $Therapeutic Activity: 8-22 mins   PT G Codes:          [name],[name] 06/26/2014, 12:19 PM [name], PT, DPT 06/26/2014 Pager: [phone]

## 2014-06-26 NOTE — Progress Notes (Signed)
CARE MANAGEMENT NOTE 06/26/2014  Patient:  Devin Clark,Devin Clark   Account Number:  [id number]  Date Initiated:  06/26/2014  Documentation initiated by:  [facility],Kapena Hamme  Subjective/Objective Assessment:   Lumbar compression fracture     Action/Plan:   lives at home with friend, Devin Clark   Anticipated DC Date:     Anticipated DC Plan:  SKILLED NURSING FACILITY  In-house referral  Clinical Social Worker      DC Associate Professorlanning Services  CM consult      [facility]   Choice offered to / List presented to:  [facility]-2 HC POA / Guardian           Status of service:  In process, will continue to follow Medicare Important Message given?   (If response is "NO", the following Medicare IM given date fields will be blank) Date Medicare IM given:   Medicare IM given by:   Date Additional Medicare IM given:   Additional Medicare IM given by:    Discharge Disposition:    Per UR Regulation:    If discussed at Long Length of Stay Meetings, dates discussed:    Comments:  06/26/2014 1720 NCM spoke to pt and gave pemission to speak to caregiver, Antanice (Peaches) Clark. Pt states his [facility], [facility]. NCM spoke to pt's caregiver, Devin Lemmingntanice Clark # [phone]. States pt lives in home with her and [facility] Ms Carole [place]Meadows is requesting light weight wheelchair. And will purchase RW out of pocket. States she requesting SNF for rehab. Pt has his own home and does not want to be in SNF long term. Referral to CSW for SNF -rehab. [facility]. Caregiver states 1st choice is SNF -rehab.  Isidoro DonningAlesia Mckaela Howley RN CCM Case Mgmt phone [phone]

## 2014-06-26 NOTE — H&P (Addendum)
Devin Clark is an 80 y.o. male.    [facility]  Chief Complaint: fall HPI: 80 yo male with htn, hyperlipidemia, cad, afib, apparently fell last Thursday.  Larey SeatFell 575feet off a city garbage can and landed on concrete.  This occurred at his house. Pt hit the stair case and then a cinder block as well, and sustained head trauma.  Pt presented to ER today for suture removal.  Pt c/o back pain and CT scan showed lumbar compression fracture.  Pt per caretaker has radiation of the pain down the right leg, and can't walk.  Pt denies incontinence, numbness, tingling, weakness, fever, chills.  Pt can't walk due to the pain. Pt has intractable pain and therefore will be admitted for intractable back pain.    Past Medical History  Diagnosis Date  . Hypertension   . CAD (coronary artery disease)   . Stented coronary artery 1998  . Sinus node dysfunction     St.Jude pacemaker 06/05/2011  . Atrial fibrillation   . Dyslipidemia   . Compression fracture 2015    Past Surgical History  Procedure Laterality Date  . Cataract extraction, bilateral  2011  . Patch perforated duodenal ulcer  05/28/11    Cheree DittoGraham patch  . Pacemaker insertion  06/05/11    St.Jude Accent DR  . Chest tube insertion  06/06/11    right iatrogenic pneumothorax  . Coronary angioplasty with stent placement  12/30/1996    stent RCA  . Cardiac catheterization  03/07/2000    100% CX,widely patent stent in RCA  . Cardiac catheterization  03/26/2007    100% CX,widely patnet stent in RCA  . Nm myoview ltd  03/04/2007    High risk,mild-mod anterolateral/inferolateral ischemia    Family History  Problem Relation Age of Onset  . Heart failure Mother   . Heart failure Father   . Heart failure Sister    Social History:  reports that he has quit smoking. His smoking use included Cigarettes. He smoked 0.00 packs per day for 1 year. He has never used smokeless tobacco. He reports that he does not drink alcohol or use illicit  drugs.  Allergies: No Known Allergies   (Not in a hospital admission)  Results for orders placed during the hospital encounter of 06/25/14 (from the past 48 hour(s))  CBC     Status: Abnormal   Collection Time    06/26/14 12:50 AM      Result Value Ref Range   WBC 9.4  4.0 - 10.5 K/uL   RBC 4.13 (*) 4.22 - 5.81 MIL/uL   Hemoglobin 12.9 (*) 13.0 - 17.0 g/dL   HCT 40.937.5 (*) 81.139.0 - 91.452.0 %   MCV 90.8  78.0 - 100.0 fL   MCH 31.2  26.0 - 34.0 pg   MCHC 34.4  30.0 - 36.0 g/dL   RDW 78.213.3  95.611.5 - 21.315.5 %   Platelets 306  150 - 400 K/uL  I-STAT CHEM 8, ED     Status: Abnormal   Collection Time    06/26/14  1:00 AM      Result Value Ref Range   Sodium 133 (*) 137 - 147 mEq/L   Potassium 3.5 (*) 3.7 - 5.3 mEq/L   Chloride 101  96 - 112 mEq/L   BUN 18  6 - 23 mg/dL   Creatinine, Ser 0.860.90  0.50 - 1.35 mg/dL   Glucose, Bld 578122 (*) 70 - 99 mg/dL   Calcium, Ion  1.11 (*) 1.13 - 1.30 mmol/L   TCO2 24  0 - 100 mmol/L   Hemoglobin 13.3  13.0 - 17.0 g/dL   HCT 16.139.0  09.639.0 - 04.552.0 %   Dg Lumbar Spine Complete  06/25/2014   CLINICAL DATA:  Patient fell on 06/17/2013 at his house wall climbing through a window. Right-sided hip and low back pain.  EXAM: LUMBAR SPINE - COMPLETE 4+ VIEW  COMPARISON:  None.  FINDINGS: There are 5 nonrib bearing lumbar-type vertebral bodies. There is an L2 vertebral body compression fracture with approximately 50% height loss. There is a T12 vertebral body compression fracture with approximately 50% height loss. The remainder the vertebral body heights are maintained. The alignment is anatomic. There is no spondylolysis. There is no static listhesis. There is degenerative disc disease most severe at L4-5.  The SI joints are unremarkable.  There is abdominal aortic atherosclerosis.  IMPRESSION: 1. T12 and L2 vertebral body compression fractures. Although these are technically age indeterminate, but given the mechanism injury these are most concerning for acute fractures.    Electronically Signed   By: Elige KoHetal  Patel   On: 06/25/2014 18:48   Dg Pelvis 1-2 Views  06/25/2014   CLINICAL DATA:  Status post fall 06/17/2014. Right hip and low back pain.  EXAM: PELVIS - 1-2 VIEW  COMPARISON:  CT abdomen and pelvis 05/28/2011.  FINDINGS: The hips are located. No fracture is identified. No notable degenerative change is seen about the hips. The symphysis pubis and sacroiliac joints appear normal. Lower lumbar spondylosis is noted.  IMPRESSION: No acute finding.   Electronically Signed   By: Drusilla Kannerhomas  Dalessio M.D.   On: 06/25/2014 18:48   Ct Lumbar Spine Wo Contrast  06/25/2014   CLINICAL DATA:  Status post fall 06/17/2014.  Back pain.  EXAM: CT LUMBAR SPINE WITHOUT CONTRAST  TECHNIQUE: Multidetector CT imaging of the lumbar spine was performed without intravenous contrast administration. Multiplanar CT image reconstructions were also generated.  COMPARISON:  Plain films lumbar spine or this same day.  FINDINGS: As seen on comparison plain films, the patient has compression fractures of T12 and L2. The T12 fracture involves inferior endplate and demonstrates approximately 90% vertebral body height loss. There is mild bony retropulsion off the inferior endplate. While the fracture cannot be definitively characterized, it appears remote with smooth margins present.  The patient's L2 compression fracture involves the superior endplate with vertebral plana deformity and mild comminution present. The fracture does not involve the posterior elements. This fracture has sharp margins suggesting an acute injury. There is bony retropulsion off the superior endplate resulting in moderate to moderately severe central canal stenosis. The neural foramina appear open.  Except described above, vertebral body height is maintained. No other fracture is seen. Dependent atelectasis is seen in the lung bases. Atherosclerotic vascular disease of the aorta without aneurysm is noted. Imaged intra-abdominal contents  are otherwise unremarkable.  The T10-11 and T11-12 levels are unremarkable.  T12-L1: Facet degenerative disease is present. The central canal and foramina appear open.  T12-L1: Mild bony retropulsion off the inferior endplate of T12 is present but the central canal and foramina appear open  L1-2: As described above.  L2-3: Minimal disc bulge and mild facet degenerative change without central canal or foraminal stenosis.  L3-4: Shallow disc bulge and ligamentum flavum thickening. Central spinal canal and foramina appear open.  L4-5: There is facet arthropathy with some ligamentum flavum thickening. Marked loss of disc space height is present but the  central canal and foramina appear open.  L5-S1: Left laminotomy defect is seen. The central canal and foramina appear open with facet arthropathy noted.  IMPRESSION: Acute appearing L2 compression fracture with bony retropulsion off the superior endplate resulting in moderate to moderately severe central canal stenosis. No extension into the posterior elements is identified.  Inferior endplate compression fracture of T12 appears remote although it cannot be definitively characterized and demonstrates mild bony retropulsion off the inferior endplate without resultant central canal stenosis.   Electronically Signed   By: Drusilla Kanner M.D.   On: 06/25/2014 20:50    Review of Systems  Constitutional: Negative.   HENT: Negative.   Eyes: Negative.   Respiratory: Negative.   Cardiovascular: Negative.   Gastrointestinal: Negative.   Genitourinary: Negative.   Musculoskeletal: Positive for back pain.  Skin: Negative.   Neurological: Negative.   Endo/Heme/Allergies: Negative.   Psychiatric/Behavioral: Positive for memory loss.    Blood pressure 138/70, pulse 70, temperature 97.8 F (36.6 C), temperature source Oral, resp. rate 18, SpO2 98.00%. Physical Exam  Constitutional: He is oriented to person, place, and time. He appears well-developed and  well-nourished.  HENT:  Head: Normocephalic and atraumatic.  Eyes: Conjunctivae and EOM are normal. Pupils are equal, round, and reactive to light.  Neck: Normal range of motion. Neck supple.  Cardiovascular: Normal rate and regular rhythm.  Exam reveals no gallop and no friction rub.   No murmur heard. Pacer on the right upper chest  Respiratory: Effort normal and breath sounds normal.  GI: Soft. Bowel sounds are normal.  Musculoskeletal: Normal range of motion.  Neurological: He is alert and oriented to person, place, and time. He has normal reflexes. He displays normal reflexes. No cranial nerve deficit. He exhibits normal muscle tone. Coordination normal.  Skin: Skin is warm and dry. No rash noted. No erythema. No pallor.  Psychiatric: He has a normal mood and affect. His behavior is normal. Judgment and thought content normal.     Assessment/Plan Intractable back pain:  dilaudid 0.5mg  iv q4h prn PT/OT to evaluate and tx  Compression fracture: Check outpatient bone density test please Check vitamin D 25-oh, check tsh, pth, magnesium, phosphorus, testosterone  Hypokalemia:  kcl 20 meq po x1, check cmp in am  Anemia: check cbc, in am  Hyperglycemia: check hga1c  CAD:  simvastatin, metoprolol,  Pt is not on aspirin due to ? Hx of duodenal ulcer,  Caretaker is not sure why he is not on aspirin  Pafib:  Cont metoprolol  L2 Compression fracture:  Consider kyphoplasty/vertebroplasty in am  Pearson Grippe 06/26/2014, 1:19 AM

## 2014-06-26 NOTE — Progress Notes (Signed)
Patient Demographics  Devin Clark, is a 80 y.o. male, DOB - 1921-01-22, WUJ:[id number]  Admit date - 06/25/2014   Admitting Physician Pearson GrippeJames Kim, MD  Outpatient Primary MD for the patient is No PCP Per Patient  LOS - 1   Chief Complaint  Patient presents with  . Leg Pain  . Suture / Staple Removal      Brief narrative:  80 yo male with htn, hyperlipidemia, cad, afib, apparently fell last Thursday. Larey SeatFell 595feet off a city garbage can and landed on concrete. This occurred at his house. Pt hit the stair case and then a cinder block as well, and sustained head trauma. Pt presented to ER today for suture removal. Pt c/o back pain and CT scan showed lumbar compression fracture. Pt per caretaker has radiation of the pain down the right leg, patient was admitted for pain management, physical therapy/occupational therapy.  Subjective:   Devin GenerousWilliam Wamble today has, No headache, No chest pain, No abdominal pain - No Nausea, No new weakness tingling or numbness, No Cough - SOB. Still complaining off lower back pain, dating to the right lower extremity.  Assessment & Plan    Principal Problem:   Lumbar compression fracture Active Problems:   Hypertension   Coronary artery disease   Alzheimer's dementia   Atrial fibrillation   Compression fracture  Lumbar compression fracture/cardiac pain -Discussed with neurosurgery on call Dr. Jordan LikesPool, recommends physical therapy/occupational therapy/pain management/tlsbo brace, if no improvement, then would consider vertebroplasty, neurosurgery wouldn't be a good surgical option given his osteoporotic vertebral.  History of A. fib -Currently normal sinus rhythm, heart rate controlled with metoprolol. -Not a candidate for anticoagulation secondary to recurrent falls.  History of coronary artery disease -Denies any chest pain, shortness  of breath, continue with metoprolol, enalapril  Hypertension -Blood pressure acceptable, continue with home meds    Code Status: Full  Family Communication: No family at bedside  Disposition Plan:  Home versus SNF   Procedures  None   Consults   Neurosurgery over the phone.   Medications  Scheduled Meds: . antiseptic oral rinse  7 mL Mouth Rinse BID  . atorvastatin  40 mg Oral q1800  . enalapril  2.5 mg Oral Daily  . enoxaparin (LOVENOX) injection  40 mg Subcutaneous Q24H  . [START ON 06/27/2014] Influenza vac split quadrivalent PF  0.5 mL Intramuscular Tomorrow-1000  . levothyroxine  50 mcg Oral QAC breakfast  . metoprolol tartrate  25 mg Oral Daily  . multivitamin with minerals  1 tablet Oral Daily  . pantoprazole  40 mg Oral Daily  . [START ON 06/27/2014] pneumococcal 23 valent vaccine  0.5 mL Intramuscular Tomorrow-1000  . sodium chloride  3 mL Intravenous Q12H  . thiamine  100 mg Oral Daily  . vitamin B-12  1,000 mcg Oral Daily  . vitamin C  500 mg Oral Daily  . vitamin E  400 Units Oral Daily   Continuous Infusions: . sodium chloride 75 mL/hr at 06/26/14 0245   PRN Meds:.sodium chloride, acetaminophen, acetaminophen, diphenhydrAMINE, docusate sodium, HYDROmorphone (DILAUDID) injection, ondansetron (ZOFRAN) IV, sodium chloride  DVT Prophylaxis   Heparin - SCDs   Lab Results  Component Value Date   PLT 304 06/26/2014    Antibiotics  Anti-infectives   None          Objective:   Filed Vitals:   06/26/14 0035 06/26/14 0230 06/26/14 0651 06/26/14 0902  BP: 138/70 137/81 131/70   Pulse: 70 65 65   Temp:  97.7 F (36.5 C) 98.2 F (36.8 C)   TempSrc:  Oral Oral   Resp: 18 18 20    Height:    5\' 9"  (1.753 m)  SpO2: 98% 99%      Wt Readings from Last 3 Encounters:  08/17/13 65.59 kg (144 lb 9.6 oz)  02/25/13 63.005 kg (138 lb 14.4 oz)  07/05/11 63.05 kg (139 lb)     Intake/Output Summary (Last 24 hours) at 06/26/14 1318 Last data filed  at 06/26/14 1610  Gross per 24 hour  Intake      0 ml  Output    200 ml  Net   -200 ml     Physical Exam  Awake Alert, Oriented X 3, No new F.N deficits, Normal affect [place].AT,PERRAL Supple Neck,No JVD, No cervical lymphadenopathy appriciated.  Symmetrical Chest wall movement, Good air movement bilaterally, CTAB RRR,No Gallops,Rubs or new Murmurs, No Parasternal Heave +ve B.Sounds, Abd Soft, No tenderness, No organomegaly appriciated, No rebound - guarding or rigidity. No Cyanosis, Clubbing or edema, No new Rash or bruise  , no focal deficits on physical exam, as low back tenderness to palpation.   Data Review   Micro Results No results found for this or any previous visit (from the past 240 hour(s)).  Radiology Reports Dg Lumbar Spine Complete  06/25/2014   CLINICAL DATA:  Patient fell on 06/17/2013 at his house wall climbing through a window. Right-sided hip and low back pain.  EXAM: LUMBAR SPINE - COMPLETE 4+ VIEW  COMPARISON:  None.  FINDINGS: There are 5 nonrib bearing lumbar-type vertebral bodies. There is an L2 vertebral body compression fracture with approximately 50% height loss. There is a T12 vertebral body compression fracture with approximately 50% height loss. The remainder the vertebral body heights are maintained. The alignment is anatomic. There is no spondylolysis. There is no static listhesis. There is degenerative disc disease most severe at L4-5.  The SI joints are unremarkable.  There is abdominal aortic atherosclerosis.  IMPRESSION: 1. T12 and L2 vertebral body compression fractures. Although these are technically age indeterminate, but given the mechanism injury these are most concerning for acute fractures.   Electronically Signed   By: Elige Ko   On: 06/25/2014 18:48   Dg Pelvis 1-2 Views  06/25/2014   CLINICAL DATA:  Status post fall 06/17/2014. Right hip and low back pain.  EXAM: PELVIS - 1-2 VIEW  COMPARISON:  CT abdomen and pelvis 05/28/2011.  FINDINGS:  The hips are located. No fracture is identified. No notable degenerative change is seen about the hips. The symphysis pubis and sacroiliac joints appear normal. Lower lumbar spondylosis is noted.  IMPRESSION: No acute finding.   Electronically Signed   By: Drusilla Kanner M.D.   On: 06/25/2014 18:48   Ct Lumbar Spine Wo Contrast  06/25/2014   CLINICAL DATA:  Status post fall 06/17/2014.  Back pain.  EXAM: CT LUMBAR SPINE WITHOUT CONTRAST  TECHNIQUE: Multidetector CT imaging of the lumbar spine was performed without intravenous contrast administration. Multiplanar CT image reconstructions were also generated.  COMPARISON:  Plain films lumbar spine or this same day.  FINDINGS: As seen on comparison plain films, the patient has compression fractures of T12 and L2. The T12 fracture involves inferior  endplate and demonstrates approximately 90% vertebral body height loss. There is mild bony retropulsion off the inferior endplate. While the fracture cannot be definitively characterized, it appears remote with smooth margins present.  The patient's L2 compression fracture involves the superior endplate with vertebral plana deformity and mild comminution present. The fracture does not involve the posterior elements. This fracture has sharp margins suggesting an acute injury. There is bony retropulsion off the superior endplate resulting in moderate to moderately severe central canal stenosis. The neural foramina appear open.  Except described above, vertebral body height is maintained. No other fracture is seen. Dependent atelectasis is seen in the lung bases. Atherosclerotic vascular disease of the aorta without aneurysm is noted. Imaged intra-abdominal contents are otherwise unremarkable.  The T10-11 and T11-12 levels are unremarkable.  T12-L1: Facet degenerative disease is present. The central canal and foramina appear open.  T12-L1: Mild bony retropulsion off the inferior endplate of T12 is present but the central  canal and foramina appear open  L1-2: As described above.  L2-3: Minimal disc bulge and mild facet degenerative change without central canal or foraminal stenosis.  L3-4: Shallow disc bulge and ligamentum flavum thickening. Central spinal canal and foramina appear open.  L4-5: There is facet arthropathy with some ligamentum flavum thickening. Marked loss of disc space height is present but the central canal and foramina appear open.  L5-S1: Left laminotomy defect is seen. The central canal and foramina appear open with facet arthropathy noted.  IMPRESSION: Acute appearing L2 compression fracture with bony retropulsion off the superior endplate resulting in moderate to moderately severe central canal stenosis. No extension into the posterior elements is identified.  Inferior endplate compression fracture of T12 appears remote although it cannot be definitively characterized and demonstrates mild bony retropulsion off the inferior endplate without resultant central canal stenosis.   Electronically Signed   By: Drusilla Kannerhomas  Dalessio M.D.   On: 06/25/2014 20:50    CBC  Recent Labs Lab 06/26/14 0050 06/26/14 0100 06/26/14 0500  WBC 9.4  --  9.2  HGB 12.9* 13.3 11.6*  HCT 37.5* 39.0 34.3*  PLT 306  --  304  MCV 90.8  --  90.7  MCH 31.2  --  30.7  MCHC 34.4  --  33.8  RDW 13.3  --  13.4  LYMPHSABS  --   --  1.2  MONOABS  --   --  0.7  EOSABS  --   --  0.2  BASOSABS  --   --  0.0    Chemistries   Recent Labs Lab 06/26/14 0100 06/26/14 0500  NA 133* 135*  K 3.5* 3.9  CL 101 100  CO2  --  23  GLUCOSE 122* 93  BUN 18 17  CREATININE 0.90 0.85  CALCIUM  --  8.4  MG  --  2.0  AST  --  24  ALT  --  19  ALKPHOS  --  84  BILITOT  --  0.5   ------------------------------------------------------------------------------------------------------------------ CrCl is unknown because both a height and weight (above a minimum accepted value) are required for this  calculation. ------------------------------------------------------------------------------------------------------------------ No results found for this basename: HGBA1C,  in the last 72 hours ------------------------------------------------------------------------------------------------------------------ No results found for this basename: CHOL, HDL, LDLCALC, TRIG, CHOLHDL, LDLDIRECT,  in the last 72 hours ------------------------------------------------------------------------------------------------------------------  Recent Labs  06/26/14 0500  TSH 4.380   ------------------------------------------------------------------------------------------------------------------ No results found for this basename: VITAMINB12, FOLATE, FERRITIN, TIBC, IRON, RETICCTPCT,  in the last 72 hours  Coagulation profile  No results found for this basename: INR, PROTIME,  in the last 168 hours  No results found for this basename: DDIMER,  in the last 72 hours  Cardiac Enzymes No results found for this basename: CK, CKMB, TROPONINI, MYOGLOBIN,  in the last 168 hours ------------------------------------------------------------------------------------------------------------------ No components found with this basename: POCBNP,      Time Spent in minutes   30 minutes    Charniece Venturino M.D on 06/26/2014 at 1:19 PM  Between 7am to 7pm - Pager - [phone]  After 7pm go to [email or web address] - password [email or web address]  And look for the night coverage person covering for me after hours  [facility] Office  [phone]   **Disclaimer: This note may have been dictated with voice recognition software. Similar sounding words can inadvertently be transcribed and this note may contain transcription errors which may not have been corrected upon publication of note.**

## 2014-06-27 ENCOUNTER — Inpatient Hospital Stay (HOSPITAL_COMMUNITY): Payer: Medicare Other

## 2014-06-27 ENCOUNTER — Encounter (HOSPITAL_COMMUNITY): Payer: Self-pay | Admitting: Radiology

## 2014-06-27 DIAGNOSIS — S32029A Unspecified fracture of second lumbar vertebra, initial encounter for closed fracture: Secondary | ICD-10-CM | POA: Diagnosis not present

## 2014-06-27 LAB — BASIC METABOLIC PANEL
Anion gap: 11 (ref 5–15)
BUN: 17 mg/dL (ref 6–23)
CHLORIDE: 102 meq/L (ref 96–112)
CO2: 23 meq/L (ref 19–32)
CREATININE: 0.93 mg/dL (ref 0.50–1.35)
Calcium: 8.2 mg/dL — ABNORMAL LOW (ref 8.4–10.5)
GFR calc Af Amer: 83 mL/min — ABNORMAL LOW (ref >90)
GFR calc non Af Amer: 71 mL/min — ABNORMAL LOW (ref >90)
GLUCOSE: 98 mg/dL (ref 70–99)
Potassium: 3.9 mEq/L (ref 3.7–5.3)
Sodium: 136 mEq/L — ABNORMAL LOW (ref 137–147)

## 2014-06-27 LAB — URINALYSIS, ROUTINE W REFLEX MICROSCOPIC
Bilirubin Urine: NEGATIVE
Glucose, UA: NEGATIVE mg/dL
HGB URINE DIPSTICK: NEGATIVE
Ketones, ur: NEGATIVE mg/dL
Leukocytes, UA: NEGATIVE
Nitrite: NEGATIVE
PH: 7 (ref 5.0–8.0)
Protein, ur: NEGATIVE mg/dL
SPECIFIC GRAVITY, URINE: 1.008 (ref 1.005–1.030)
Urobilinogen, UA: 1 mg/dL (ref 0.0–1.0)

## 2014-06-27 LAB — CBC
HEMATOCRIT: 35.3 % — AB (ref 39.0–52.0)
HEMOGLOBIN: 11.9 g/dL — AB (ref 13.0–17.0)
MCH: 30.8 pg (ref 26.0–34.0)
MCHC: 33.7 g/dL (ref 30.0–36.0)
MCV: 91.5 fL (ref 78.0–100.0)
Platelets: 325 10*3/uL (ref 150–400)
RBC: 3.86 MIL/uL — AB (ref 4.22–5.81)
RDW: 13.5 % (ref 11.5–15.5)
WBC: 7.6 10*3/uL (ref 4.0–10.5)

## 2014-06-27 MED ORDER — OXYCODONE-ACETAMINOPHEN 5-325 MG PO TABS
1.0000 | ORAL_TABLET | ORAL | Status: DC | PRN
  Administered 2014-06-27 – 2014-06-29 (×5): 1 via ORAL
  Filled 2014-06-27 (×5): qty 1

## 2014-06-27 NOTE — Progress Notes (Signed)
Patient Demographics  Devin Clark, is a 80 y.o. male, DOB - 1921/02/02, ZOX:[id number]  Admit date - 06/25/2014   Admitting Physician Devin Grippe, MD  Outpatient Primary MD for the patient is No PCP Per Patient  LOS - 2   Chief Complaint  Patient presents with  . Leg Pain  . Suture / Staple Removal      Brief narrative:  80 yo male with htn, hyperlipidemia, cad, afib, apparently fell last Thursday. Larey Seat 60feet off a city garbage [place] and landed on concrete. This occurred at his house. Pt hit the stair case and then a cinder block as well, and sustained head trauma. Pt presented to ER today for suture removal. Pt c/o back pain and CT scan showed lumbar compression fracture. Pt per caretaker has radiation of the pain down the right leg, patient was admitted for pain management, physical therapy/occupational therapy.  Subjective:   Devin Clark today has, No headache, No chest pain, No abdominal pain - No Nausea, No new weakness tingling or numbness, No Cough - SOB. Still complaining off lower back pain, dating to the right lower extremity.  Assessment & Plan    Principal Problem:   Lumbar compression fracture Active Problems:   Hypertension   Coronary artery disease   Alzheimer's dementia   Atrial fibrillation   Compression fracture  Lumbar compression fracture -Discussed with neurosurgery on call Devin Clark, recommends physical therapy/occupational therapy/pain management/tlsbo brace, if no improvement, then would consider vertebroplasty, neurosurgery wouldn't be a good surgical option given his osteoporotic vertebral. -Patient significant right hip pain, CT hip was done with no evidence of fracture or dislocation.  History of A. fib -Currently normal sinus rhythm, heart rate controlled with metoprolol. -Not a candidate for anticoagulation secondary to  recurrent falls.  History of coronary artery disease -Denies any chest pain, shortness of breath, continue with metoprolol, enalapril  Hypertension -Blood pressure acceptable, continue with home meds    Code Status: Full  Family Communication: No family at bedside  Disposition Plan:  SNF   Procedures  None   Consults   Neurosurgery over the phone.   Medications  Scheduled Meds: . antiseptic oral rinse  7 mL Mouth Rinse BID  . atorvastatin  40 mg Oral q1800  . enalapril  2.5 mg Oral Daily  . enoxaparin (LOVENOX) injection  40 mg Subcutaneous Q24H  . levothyroxine  50 mcg Oral QAC breakfast  . metoprolol tartrate  25 mg Oral Daily  . multivitamin with minerals  1 tablet Oral Daily  . pantoprazole  40 mg Oral Daily  . sodium chloride  3 mL Intravenous Q12H  . thiamine  100 mg Oral Daily  . vitamin B-12  1,000 mcg Oral Daily  . vitamin C  500 mg Oral Daily  . vitamin E  400 Units Oral Daily   Continuous Infusions:   PRN Meds:.sodium chloride, acetaminophen, acetaminophen, diphenhydrAMINE, docusate sodium, HYDROmorphone (DILAUDID) injection, ondansetron (ZOFRAN) IV, oxyCODONE-acetaminophen, sodium chloride  DVT Prophylaxis   Heparin - SCDs   Lab Results  Component Value Date   PLT 325 06/27/2014    Antibiotics    Anti-infectives   None          Objective:   Filed Vitals:   06/26/14  1610 06/26/14 1454 06/26/14 2212 06/27/14 0540  BP:  142/69 120/63 138/74  Pulse:  61 66 69  Temp:  97.6 F (36.4 C) 98.2 F (36.8 C) 97.4 F (36.3 C)  TempSrc:  Axillary Oral Oral  Resp:  20 20 18   Height: 5\' 9"  (1.753 m)     Weight: 63 kg (138 lb 14.2 oz)   59.8 kg (131 lb 13.4 oz)  SpO2:  100% 98% 95%    Wt Readings from Last 3 Encounters:  06/27/14 59.8 kg (131 lb 13.4 oz)  08/17/13 65.59 kg (144 lb 9.6 oz)  02/25/13 63.005 kg (138 lb 14.4 oz)     Intake/Output Summary (Last 24 hours) at 06/27/14 1331 Last data filed at 06/27/14 1300  Gross per 24 hour   Intake 1177.5 ml  Output   1650 ml  Net -472.5 ml     Physical Exam  Awake Alert, Oriented X 3, No new F.N deficits, Normal affect [name],PERRAL Supple Neck,No JVD, No cervical lymphadenopathy appriciated.  Symmetrical Chest wall movement, Good air movement bilaterally, CTAB RRR,No Gallops,Rubs or new Murmurs, No Parasternal Heave +ve B.Sounds, Abd Soft, No tenderness, No organomegaly appriciated, No rebound - guarding or rigidity. No Cyanosis, Clubbing or edema, No new Rash or bruise  , no focal deficits on physical exam, as low back tenderness to palpation.   Data Review   Micro Results No results found for this or any previous visit (from the past 240 hour(s)).  Radiology Reports Dg Lumbar Spine Complete  06/25/2014   CLINICAL DATA:  Patient fell on 06/17/2013 at his house wall climbing through a window. Right-sided hip and low back pain.  EXAM: LUMBAR SPINE - COMPLETE 4+ VIEW  COMPARISON:  None.  FINDINGS: There are 5 nonrib bearing lumbar-type vertebral bodies. There is an L2 vertebral body compression fracture with approximately 50% height loss. There is a T12 vertebral body compression fracture with approximately 50% height loss. The remainder the vertebral body heights are maintained. The alignment is anatomic. There is no spondylolysis. There is no static listhesis. There is degenerative disc disease most severe at L4-5.  The SI joints are unremarkable.  There is abdominal aortic atherosclerosis.  IMPRESSION: 1. T12 and L2 vertebral body compression fractures. Although these are technically age indeterminate, but given the mechanism injury these are most concerning for acute fractures.   Electronically Signed   By: Elige Ko   On: 06/25/2014 18:48   Dg Pelvis 1-2 Views  06/25/2014   CLINICAL DATA:  Status post fall 06/17/2014. Right hip and low back pain.  EXAM: PELVIS - 1-2 VIEW  COMPARISON:  CT abdomen and pelvis 05/28/2011.  FINDINGS: The hips are located. No fracture is  identified. No notable degenerative change is seen about the hips. The symphysis pubis and sacroiliac joints appear normal. Lower lumbar spondylosis is noted.  IMPRESSION: No acute finding.   Electronically Signed   By: Drusilla Kanner M.D.   On: 06/25/2014 18:48   Ct Lumbar Spine Wo Contrast  06/25/2014   CLINICAL DATA:  Status post fall 06/17/2014.  Back pain.  EXAM: CT LUMBAR SPINE WITHOUT CONTRAST  TECHNIQUE: Multidetector CT imaging of the lumbar spine was performed without intravenous contrast administration. Multiplanar CT image reconstructions were also generated.  COMPARISON:  Plain films lumbar spine or this same day.  FINDINGS: As seen on comparison plain films, the patient has compression fractures of T12 and L2. The T12 fracture involves inferior endplate and demonstrates approximately 90% vertebral body height  loss. There is mild bony retropulsion off the inferior endplate. While the fracture cannot be definitively characterized, it appears remote with smooth margins present.  The patient's L2 compression fracture involves the superior endplate with vertebral plana deformity and mild comminution present. The fracture does not involve the posterior elements. This fracture has sharp margins suggesting an acute injury. There is bony retropulsion off the superior endplate resulting in moderate to moderately severe central canal stenosis. The neural foramina appear open.  Except described above, vertebral body height is maintained. No other fracture is seen. Dependent atelectasis is seen in the lung bases. Atherosclerotic vascular disease of the aorta without aneurysm is noted. Imaged intra-abdominal contents are otherwise unremarkable.  The T10-11 and T11-12 levels are unremarkable.  T12-L1: Facet degenerative disease is present. The central canal and foramina appear open.  T12-L1: Mild bony retropulsion off the inferior endplate of T12 is present but the central canal and foramina appear open  L1-2:  As described above.  L2-3: Minimal disc bulge and mild facet degenerative change without central canal or foraminal stenosis.  L3-4: Shallow disc bulge and ligamentum flavum thickening. Central spinal canal and foramina appear open.  L4-5: There is facet arthropathy with some ligamentum flavum thickening. Marked loss of disc space height is present but the central canal and foramina appear open.  L5-S1: Left laminotomy defect is seen. The central canal and foramina appear open with facet arthropathy noted.  IMPRESSION: Acute appearing L2 compression fracture with bony retropulsion off the superior endplate resulting in moderate to moderately severe central canal stenosis. No extension into the posterior elements is identified.  Inferior endplate compression fracture of T12 appears remote although it cannot be definitively characterized and demonstrates mild bony retropulsion off the inferior endplate without resultant central canal stenosis.   Electronically Signed   By: Drusilla Kannerhomas  Dalessio M.D.   On: 06/25/2014 20:50   Ct Hip Right Wo Contrast  06/27/2014   CLINICAL DATA:  Status post fall 06/17/2014 with continued right hip pain. Subsequent encounter.  EXAM: CT OF THE RIGHT HIP WITHOUT CONTRAST  TECHNIQUE: Multidetector CT imaging of the right hip was performed according to the standard protocol. Multiplanar CT image reconstructions were also generated.  COMPARISON:  Plain film of the pelvis recess Single view of the pelvis 06/25/2014.  FINDINGS: The right hip is located. No fracture is identified. No notable degenerative change is seen about the hip. No fracture is identified. Small right hip joint effusion is noted. Visualized musculature about the right hip appears normal. There is partial visualization of a left inguinal hernia containing bowel. Atherosclerotic calcifications are noted.  IMPRESSION: Negative for fracture or dislocation. No acute abnormality is identified. If the patient refuses to bear weight  or pain persists, MRI would be useful for further evaluation.  Small right hip joint effusion.   Electronically Signed   By: Drusilla Kannerhomas  Dalessio M.D.   On: 06/27/2014 12:10    CBC  Recent Labs Lab 06/26/14 0050 06/26/14 0100 06/26/14 0500 06/27/14 0540  WBC 9.4  --  9.2 7.6  HGB 12.9* 13.3 11.6* 11.9*  HCT 37.5* 39.0 34.3* 35.3*  PLT 306  --  304 325  MCV 90.8  --  90.7 91.5  MCH 31.2  --  30.7 30.8  MCHC 34.4  --  33.8 33.7  RDW 13.3  --  13.4 13.5  LYMPHSABS  --   --  1.2  --   MONOABS  --   --  0.7  --  EOSABS  --   --  0.2  --   BASOSABS  --   --  0.0  --     Chemistries   Recent Labs Lab 06/26/14 0100 06/26/14 0500 06/27/14 0540  NA 133* 135* 136*  K 3.5* 3.9 3.9  CL 101 100 102  CO2  --  23 23  GLUCOSE 122* 93 98  BUN 18 17 17   CREATININE 0.90 0.85 0.93  CALCIUM  --  8.4 8.2*  MG  --  2.0  --   AST  --  24  --   ALT  --  19  --   ALKPHOS  --  84  --   BILITOT  --  0.5  --    ------------------------------------------------------------------------------------------------------------------ estimated creatinine clearance is 43.8 ml/min (by C-G formula based on Cr of 0.93). ------------------------------------------------------------------------------------------------------------------  Recent Labs  06/26/14 0500  HGBA1C 6.0*   ------------------------------------------------------------------------------------------------------------------ No results found for this basename: CHOL, HDL, LDLCALC, TRIG, CHOLHDL, LDLDIRECT,  in the last 72 hours ------------------------------------------------------------------------------------------------------------------  Recent Labs  06/26/14 0500  TSH 4.380   ------------------------------------------------------------------------------------------------------------------ No results found for this basename: VITAMINB12, FOLATE, FERRITIN, TIBC, IRON, RETICCTPCT,  in the last 72 hours  Coagulation profile No  results found for this basename: INR, PROTIME,  in the last 168 hours  No results found for this basename: DDIMER,  in the last 72 hours  Cardiac Enzymes No results found for this basename: CK, CKMB, TROPONINI, MYOGLOBIN,  in the last 168 hours ------------------------------------------------------------------------------------------------------------------ No components found with this basename: POCBNP,      Time Spent in minutes   30 minutes    Stepheny Canal M.D on 06/27/2014 at 1:31 PM  Between 7am to 7pm - Pager - [phone]  After 7pm go to [email or web address] - password [email or web address]  And look for the night coverage person covering for me after hours  [facility] Office  [phone]   **Disclaimer: This note may have been dictated with voice recognition software. Similar sounding words can inadvertently be transcribed and this note may contain transcription errors which may not have been corrected upon publication of note.**

## 2014-06-27 NOTE — Progress Notes (Addendum)
Patient's neighbor expressed interest in [facility] as it is close to where they live, does not want him to go to [facility]. [facility] - will follow-up in the morning re: bed offer.   Clinical Social Work Department CLINICAL SOCIAL WORK PLACEMENT NOTE 06/27/2014  Patient:  Devin BruinsMANESS,[name]  Account Number:  [id number] Admit date:  06/25/2014  Clinical Social Worker:  Orpah GreekKELLY FOLEY, LCSWA  Date/time:  06/27/2014 02:52 PM  Clinical Social Work is seeking post-discharge placement for this patient at the following level of care:   SKILLED NURSING   (*CSW will update this form in Epic as items are completed)   06/27/2014  Patient/family provided with [facility] Department of Clinical Social Work's list of facilities offering this level of care within the geographic area requested by the patient (or if unable, by the patient's family).  06/27/2014  Patient/family informed of their freedom to choose among providers that offer the needed level of care, that participate in Medicare, Medicaid or managed care program needed by the patient, have an available bed and are willing to accept the patient.  06/27/2014  Patient/family informed of [facility]' ownership interest in [facility], as well as of the fact that they are under no obligation to receive care at this facility.  PASARR submitted to EDS on 06/27/2014 PASARR number received on 06/27/2014  FL2 transmitted to all facilities in geographic area requested by pt/family on  06/27/2014 FL2 transmitted to all facilities within larger geographic area on   Patient informed that his/her managed care company has contracts with or will negotiate with  certain facilities, including the following:     Patient/family informed of bed offers received:  06/28/14 Patient [facility] bed at [facility] Physician recommends and patient chooses bed at    Patient to be [facility] on   06/29/14 Patient to be transferred to facility by [facility] Patient and family notified of transfer on 06/29/14 Name of family member notified:  Peaches-neighbor via phone---CSW unable to reach any family. No HCPOA listed in chart. Patient aware of DC as well and agreeable to [facility].  The following physician request were entered in Epic:   Additional Comments:   Lincoln MaxinKelly Harrison, LCSW [facility] Clinical Social Worker cell #: [phone]

## 2014-06-27 NOTE — Progress Notes (Signed)
Clinical Social Work Department BRIEF PSYCHOSOCIAL ASSESSMENT 06/27/2014  Patient:  Devin Clark,Devin Clark     Account Number:  [id number]     Admit date:  06/25/2014  Clinical Social Worker:  Orpah GreekFOLEY,Verlyn Lambert, LCSWA  Date/Time:  06/27/2014 02:47 PM  Referred by:  Physician  Date Referred:  06/27/2014 Referred for  SNF Placement   Other Referral:   Interview type:  Patient Other interview type:   and neighbor/caregiver, Devin Clark at bedside    PSYCHOSOCIAL DATA Living Status:  ALONE Admitted from facility:   Level of care:   Primary support name:  Devin Clark  (neighbor/caregiver) h#: [phone] Primary support relationship to patient:  NEIGHBOR Degree of support available:   good    CURRENT CONCERNS Current Concerns  Post-Acute Placement   Other Concerns:    SOCIAL WORK ASSESSMENT / PLAN CSW reviewed PT evaluation recommending SNF at discharge.   Assessment/plan status:  [facility] to [facility] Other assessment/ plan:   Information/referral to community resources:   CSW completed FL2 and [facility] - provided list of facilities to patient/neighbor.    PATIENT'S/FAMILY'S RESPONSE TO PLAN OF CARE: Patient's neighbor informed CSW that patient has 2 sons, 1 [facility] and another who has no contact with patient. Patient's ex-wife moved into an ALF last year and neighbor has been encouraging patient to do the same. Neighbor informed CSW that she is not only a neighbor but a caregiver for the patient - cleans his house, assists with ADL's. Neighbor states that she was a Engineer, civil (consulting)nurse for 10 years and understands the need for him to go to a SNF before returning home. Patient currently is confused - believes CSW to be a Immunologistsalesperson.         Devin Clark, Devin Clark Clinical Social Worker cell #: [phone]

## 2014-06-28 LAB — PARATHYROID HORMONE, INTACT (NO CA): PTH: 52 pg/mL (ref 14–64)

## 2014-06-28 LAB — VITAMIN D 25 HYDROXY (VIT D DEFICIENCY, FRACTURES): Vit D, 25-Hydroxy: 57 ng/mL (ref 30–89)

## 2014-06-28 LAB — TESTOSTERONE, % FREE: Testosterone-% Free: 1.1 % — ABNORMAL LOW (ref 1.6–2.9)

## 2014-06-28 LAB — TESTOSTERONE, FREE: TESTOSTERONE FREE: 17.5 pg/mL — AB (ref 47.0–244.0)

## 2014-06-28 LAB — SEX HORMONE BINDING GLOBULIN: SEX HORMONE BINDING: 70 nmol/L (ref 13–71)

## 2014-06-28 NOTE — Progress Notes (Signed)
[facility] PROGRESS NOTE  [name] ZOX:[id number] DOB: 1921-04-28 DOA: 06/25/2014 PCP: No PCP Per Patient  Assessment/Plan:  Lumbar compression fracture  -Per EMR my associate discussed case with neurosurgery on call Dr. Jordan LikesPool, recommends physical therapy/occupational therapy/pain management/tlsbo brace, if no improvement, then would consider vertebroplasty, neurosurgery wouldn't be a good surgical option given his osteoporotic vertebral.  -Patient significant right hip pain, CT hip was done with no evidence of fracture or dislocation. - PT on board  History of A. fib  -Continue B blocker. Currently rate controlled -Not a candidate for anticoagulation secondary to recurrent falls.   History of coronary artery disease  -No chest pain reported, shortness of breath, continue with metoprolol, enalapril   Hypertension  -Blood pressure acceptable, continue with home meds  Code Status: full Family Communication: None at bedside Disposition Plan: SNF or ALF   Consultants:  none  Procedures:  None  Antibiotics:  None  HPI/Subjective: No new complaints  Objective: Filed Vitals:   06/28/14 1418  BP: 135/89  Pulse: 66  Temp: 97.9 F (36.6 C)  Resp: 20    Intake/Output Summary (Last 24 hours) at 06/28/14 1908 Last data filed at 06/28/14 1751  Gross per 24 hour  Intake    120 ml  Output    800 ml  Net   -680 ml   Filed Weights   06/26/14 0902 06/27/14 0540 06/28/14 0511  Weight: 63 kg (138 lb 14.2 oz) 59.8 kg (131 lb 13.4 oz) 59.5 kg (131 lb 2.8 oz)    Exam:   General:  Pt in nad, alert and awake  Cardiovascular: normal s1 and s2, no mrg  Respiratory: CTA BL, no wheezes  Abdomen: soft, nt, nd  Musculoskeletal: no cyanosis   Data Reviewed: Basic Metabolic Panel:  Recent Labs Lab 06/26/14 0100 06/26/14 0500 06/27/14 0540  NA 133* 135* 136*  K 3.5* 3.9 3.9  CL 101 100 102  CO2  --  23 23  GLUCOSE 122* 93 98  BUN 18 17 17    CREATININE 0.90 0.85 0.93  CALCIUM  --  8.4 8.2*  MG  --  2.0  --   PHOS  --  3.1  --    Liver Function Tests:  Recent Labs Lab 06/26/14 0500  AST 24  ALT 19  ALKPHOS 84  BILITOT 0.5  PROT 5.9*  ALBUMIN 2.9*   No results found for this basename: LIPASE, AMYLASE,  in the last 168 hours No results found for this basename: AMMONIA,  in the last 168 hours CBC:  Recent Labs Lab 06/26/14 0050 06/26/14 0100 06/26/14 0500 06/27/14 0540  WBC 9.4  --  9.2 7.6  NEUTROABS  --   --  7.0  --   HGB 12.9* 13.3 11.6* 11.9*  HCT 37.5* 39.0 34.3* 35.3*  MCV 90.8  --  90.7 91.5  PLT 306  --  304 325   Cardiac Enzymes: No results found for this basename: CKTOTAL, CKMB, CKMBINDEX, TROPONINI,  in the last 168 hours BNP (last 3 results) No results found for this basename: PROBNP,  in the last 8760 hours CBG: No results found for this basename: GLUCAP,  in the last 168 hours  No results found for this or any previous visit (from the past 240 hour(s)).   Studies: Ct Hip Right Wo Contrast  06/27/2014   CLINICAL DATA:  Status post fall 06/17/2014 with continued right hip pain. Subsequent encounter.  EXAM: CT OF THE RIGHT HIP WITHOUT CONTRAST  TECHNIQUE: Multidetector CT imaging of the right hip was performed according to the standard protocol. Multiplanar CT image reconstructions were also generated.  COMPARISON:  Plain film of the pelvis recess Single view of the pelvis 06/25/2014.  FINDINGS: The right hip is located. No fracture is identified. No notable degenerative change is seen about the hip. No fracture is identified. Small right hip joint effusion is noted. Visualized musculature about the right hip appears normal. There is partial visualization of a left inguinal hernia containing bowel. Atherosclerotic calcifications are noted.  IMPRESSION: Negative for fracture or dislocation. No acute abnormality is identified. If the patient refuses to bear weight or pain persists, MRI would be  useful for further evaluation.  Small right hip joint effusion.   Electronically Signed   By: Drusilla Kannerhomas  Dalessio M.D.   On: 06/27/2014 12:10    Scheduled Meds: . antiseptic oral rinse  7 mL Mouth Rinse BID  . atorvastatin  40 mg Oral q1800  . enalapril  2.5 mg Oral Daily  . enoxaparin (LOVENOX) injection  40 mg Subcutaneous Q24H  . levothyroxine  50 mcg Oral QAC breakfast  . metoprolol tartrate  25 mg Oral Daily  . multivitamin with minerals  1 tablet Oral Daily  . pantoprazole  40 mg Oral Daily  . sodium chloride  3 mL Intravenous Q12H  . thiamine  100 mg Oral Daily  . vitamin B-12  1,000 mcg Oral Daily  . vitamin C  500 mg Oral Daily  . vitamin E  400 Units Oral Daily   Continuous Infusions:   Principal Problem:   Lumbar compression fracture Active Problems:   Hypertension   Coronary artery disease   Alzheimer's dementia   Atrial fibrillation   Compression fracture    Time spent: > 35 minutes    Penny PiaVEGA, Nezar Buckles  [facility] Pager [phone]. If 7PM-7AM, please contact night-coverage at [email or web address], password [email or web address] 06/28/2014, 7:08 PM  LOS: 3 days

## 2014-06-28 NOTE — Progress Notes (Addendum)
Clinical Social Work  CSW met with patient and caregiver Leanna Battles [phone]) at bedside. Patient and caregiver still desiring placement at [facility]. Caregiver confirms no other family members available to make decisions but that she will assist as needed. CSW spoke with Rollene Fare at [facility] who reports they can admit patient when medically stable. CSW will continue to follow.  [place], [place] [phone]

## 2014-06-28 NOTE — Progress Notes (Signed)
Physical Therapy Treatment Patient Details Name: Moody BruinsWilliam C Giraud MRN: [id number] DOB: Feb 03, 1921 Today's Date: 06/28/2014    History of Present Illness 80 yo male with HTN, hyperlipidemia, CAD, afib, pacemaker admitted after apparently falling on Thursday.  Pelvis xray negative for acute findings however CT lumbar spine: Acute appearing L2 compression fracture with bony retropulsion off the superior endplate resulting in moderate to moderately severe central canal stenosis.  Per RN, no plans for KP, and lumbar brace present in room.  Pt now with TLSO brace in room and CT R hip negative for fracture and dislocation.     PT Comments    Pt premedicated and still having pain in R anterior thigh however not necessarily coming on due to certain movement.  Difficult to assess pain as pt keep moving R LE and very fearful of pain requiring increased time for activity and decreasing mobility.  Pt able to tolerate standing and WBing on R LE with assist however very fearful of pain and continued to request sitting.  Pt's neighbor "Peaches" in room and assisted with encouraging pt mobility and progress, as well as calming pt with pain.  Pt's pain mostly with bed mobility and sitting in R anterior thigh, denies back pain.  Pt leaning towards L side upon sitting and unable to tolerate weight shift onto R buttocks so unable to appropriately apply brace; pt also pushing trunk into extension and clutching R leg during short bouts of pain.  Will attempt applying brace during next session per pt tolerance.   Follow Up Recommendations  SNF     Equipment Recommendations  Rolling walker with 5" wheels;3in1 (PT)    Recommendations for Other Services       Precautions / Restrictions Precautions Precautions: Fall;Back Required Braces or Orthoses: Spinal Brace Spinal Brace: Thoracolumbosacral orthotic    Mobility  Bed Mobility Overal bed mobility: Needs Assistance;+2 for physical assistance Bed Mobility: Sit  to Sidelying;Supine to Sit     Supine to sit: Total assist;+2 for safety/equipment   Sit to sidelying: Mod assist;+2 for safety/equipment General bed mobility comments: pt given cues for log roll technique however reports too much pain and assisted to EOB supine to sit with occasional moment of pt clutching R leg and pushing trunk into extension; assist back to bed sit to sidelying with assist for LEs onto bed and pt holding onto rail  Transfers Overall transfer level: Needs assistance Equipment used: Rolling walker (2 wheeled) Transfers: Sit to/from Stand Sit to Stand: +2 physical assistance;Total assist         General transfer comment: mod to total assist due to pain, pt kept hands on RW, pt very fearful of pain which limited mobility  Ambulation/Gait                 Stairs            Wheelchair Mobility    Modified Rankin (Stroke Patients Only)       Balance Overall balance assessment: Needs assistance         Standing balance support: Bilateral upper extremity supported Standing balance-Leahy Scale: Zero Standing balance comment: pt assisted to standing and performed pregait activities such as weight shifting onto R LE (pt fearful of pain however did well with very little bouts of pain during standing) and marching in place, pt standing for approx 8 minutes                    Cognition Arousal/Alertness: Awake/alert Behavior During  Therapy: WFL for tasks assessed/performed Overall Cognitive Status: Within Functional Limits for tasks assessed (appears a little confused however given IV meds prior to session)                      Exercises      General Comments        Pertinent Vitals/Pain Pain Assessment: 0-10 Pain Score: 10-Worst pain ever Pain Location: R anterior thigh pain Pain Descriptors / Indicators: Sharp;Jabbing Pain Intervention(s): Limited activity within patient's tolerance;Monitored during session;Premedicated  before session    Home Living                      Prior Function            PT Goals (current goals can now be found in the care plan section) Progress towards PT goals: Progressing toward goals    Frequency  Min 3X/week    PT Plan Current plan remains appropriate    Co-evaluation             End of Session   Activity Tolerance: Patient limited by pain Patient left: in bed;with call bell/phone within reach;with bed alarm set;with family/visitor present     Time: [id number] PT Time Calculation (min): 28 min  Charges:  $Therapeutic Activity: 23-37 mins                    G Codes:      Hillel Card,[name] 06/28/2014, 1:39 PM Zenovia JarredKati Montrail Mehrer, PT, DPT 06/28/2014 Pager: [phone]

## 2014-06-29 ENCOUNTER — Other Ambulatory Visit: Payer: Self-pay | Admitting: *Deleted

## 2014-06-29 MED ORDER — CEPHALEXIN 500 MG PO CAPS: 500.0000 mg | ORAL_CAPSULE | Freq: Two times a day (BID) | ORAL | Status: DC

## 2014-06-29 MED ORDER — OXYCODONE-ACETAMINOPHEN 5-325 MG PO TABS: 1.0000 | ORAL_TABLET | ORAL | Status: DC | PRN

## 2014-06-29 NOTE — Progress Notes (Signed)
Called to give report to Devin Bradfordhante Cunningham RN at [facility]. Patient stable, AO x4 and assessment WNL. I left my phone number with the nurse in case she should need me for any further information. Pt. Discharged to [facility] with belongings and two back braces.

## 2014-06-29 NOTE — Progress Notes (Signed)
Clinical Social Work  CSW [facility]ed [facility] summary to [facility] is agree[facility] accept patient today. DC packet prepared with FL2, DC summary, and hard scripts included. Patient and caregiver (Peaches) aware and agreeable to plans. RN to call report to SNF. CSW coordinated transportation via [facility] per patient request. [facility] #: [id number].  CSW is signing off but available if needed.  Devin Clark, [place] [phone]

## 2014-06-29 NOTE — Discharge Instructions (Signed)
Lumbar Fracture °A fracture of a bone is the same as a break in the bone. A fracture in the lumbar area is a break that involves one of many parts that make up the 5 bones of the low back area. This is just above the pelvis.  °CAUSES °Most of these injuries occur as a result of an accident such as: °· A fall. °· A car accident. °· Recreational activities. °· A smaller number occur due to: °¨ Industrial, farm, and aviation accidents. °¨ Gunshot wounds and direct blows to the back. °¨ Parachuting incidents. °Most lumbar fractures affect the "building blocks" or the main portion of the spine known as the "vertebral bodies" (see the image on the right). A smaller number involve breaks to portions of bone that extend to the sides or backward behind the vertebral body. In the elderly, a sudden break can happen without an apparent cause. This is because the bones of the back have become extremely thin and fragile. This condition is known as osteoporosis. °SYMPTOMS °Patients with lumbar fractures have severe pain even if the actual break is small or limited, and there is no injury to nearby nerves. More severe or complex injuries involving other bones and/or organs may include:  °· Deformity of the back bones. °· Swelling/bruising over the injured area. °· Limited ability to move the affected area. °· Partial or complete loss of function of the bladder and/or bowels. (This may be due to injury to nearby nerves). °· More severe injuries can also cause: °¨ Loss of sensation and/or strength in the legs, feet, and toes. °¨ Paralysis. °DIAGNOSIS °In most cases, a lumbar fracture will be suspected by what happened just prior to the onset of back pain. X-rays and special imaging (CT scan and MRI imaging) are used to confirm the diagnosis as well as finding out the type and severity of the break or breaks. These tests guide treatment. But there are times when special imaging cannot be done. For example, MRI cannot be done if there  is an implanted metallic device (such as a pacemaker). In these cases, other tests and imaging are done. °If there has been nerve damage, more tests can be done. These include: °· Tests of nerve function through muscles (nerve conduction studies and electromyography). °· Tests of bladder function (urodynamics). °· Tests that focus on defining specific nerve problems before surgery and what improvement has come about after surgery (evoked potentials). °TREATMENT °Common injuries may involve a small break off of the main surface of the back bone. Or they may be in the form of a partial flattening or compression of the bone. Hospital care may not be needed for these. Medicine for pain control, special back bracing, and limitations in activity are done first. Physical therapy follows later. °Complex breaks, multiple fractures of the spine, or unstable injuries can damage the spinal cord. They may require an operation to remove pressure from the nerves and/or spinal cord and to stabilize the broken pieces of bone. Each individual set of injuries is unique. The surgeon will take into consideration many things when planning the best surgical approach that will give the highest likelihood of a good outcome.  °HOME CARE INSTRUCTIONS °There is pain and stiffness in the back for weeks after a vertebral fracture. Bed rest, pain medicine, and a slow return to activity are generally recommended. Neck and back braces may be helpful in reducing pain and increasing mobility. When your pain allows, simple walking will help to begin the   process of returning to normal activities. Exercises to improve motion and to strengthen the back may also be useful after the initial pain goes away. This will be guided by your caregiver and the team (nurses, physical therapists, occupational therapists, etc.) involved with your ongoing care. For the elderly, treatment for osteoporosis may be needed to help reduce the risk of fractures in the  future. °Arrange for follow-up care as recommended to assure proper long-term care and prevention of further spine injury. The failure to follow-up as recommended could result in permanent injury, disability, and a chronic painful condition. °SEEK MEDICAL CARE IF: °· Pain is not effectively controlled with medication. °· You feel unable to decrease pain medication over time as planned. °· Activity level is not improving as planned and/or expected. °SEEK IMMEDIATE MEDICAL CARE IF: °· You have increasing pain, vomiting, or are unable to move around at all. °· You have numbness, tingling, weakness, or paralysis of any part of your body. °· You have loss of normal bowel or bladder control. °· You have difficulty breathing, cough, fever, chest or abdominal pain. °Document Released: 12/12/2006 Document Revised: 11/19/2011 Document Reviewed: 08/12/2007 °Exit[employer]® Patient Information ©2015 [employer]. This information is not intended to replace advice given to you by your health care provider. Make sure you discuss any questions you have with your health care provider. ° °

## 2014-06-29 NOTE — Telephone Encounter (Signed)
[facility] 

## 2014-06-29 NOTE — Discharge Summary (Signed)
Physician Discharge Summary  Devin Clark ZOX:[id number] DOB: 1920/10/21 DOA: 06/25/2014  PCP: No PCP Per Patient  Admit date: 06/25/2014 Discharge date: 06/29/2014  Time spent: > 35 minutes  Recommendations for Outpatient Follow-up:  1. Please see d/c instructions below  Discharge Diagnoses:  Principal Problem:   Lumbar compression fracture Active Problems:   Hypertension   Coronary artery disease   Alzheimer's dementia   Atrial fibrillation   Compression fracture   Discharge Condition: stable  Diet recommendation: low sodium heart healthy  Filed Weights   06/27/14 0540 06/28/14 0511 06/29/14 0554  Weight: 59.8 kg (131 lb 13.4 oz) 59.5 kg (131 lb 2.8 oz) 57.7 kg (127 lb 3.3 oz)    History of present illness:  Pt is a 80 y/o with history of htn, hyperlipidemia, cad, afib,  fell last Thursday (prior to admission). Reportedly fell 5 feet off a city garbage [place] and landed on concrete. This occurred at his house. Pt hit the stair case and then a cinder block as well, and sustained head trauma. Pt presented to ER today for suture removal. Pt c/o back pain and CT scan showed lumbar compression fracture.   Hospital Course:   Lumbar compression fracture  -Per EMR my associate discussed case with neurosurgery on call Dr. Jordan Likes, recommends physical therapy/occupational therapy/pain management/tlsbo brace, if no improvement, then would consider vertebroplasty, neurosurgery wouldn't be a good surgical option given his osteoporotic vertebral.  -Patient significant right hip pain, CT hip was done with no evidence of fracture or dislocation.  - PT on board   History of A. fib  -Continue B blocker. Currently rate controlled  -Not a candidate for anticoagulation secondary to recurrent falls.   History of coronary artery disease  -No chest pain reported, shortness of breath, continue with metoprolol, enalapril   Hypertension  -Blood pressure acceptable, continue with home  meds  Right arm cellulitis - new problem - will discharge on keflex  Procedures:  None  Consultations:  Case was discussed with neurosurgery   Discharge Exam: Filed Vitals:   06/29/14 0554  BP: 160/68  Pulse: 67  Temp: 97.5 F (36.4 C)  Resp: 20    General: Pt in nad, alert and awake Cardiovascular: rrr, no mrg Respiratory: cta bl, no wheezes Skin: Pt has erythema where IV was placed near right elbow. It is warm and tender to palpation  Discharge Instructions You were cared for by a hospitalist during your hospital stay. If you have any questions about your discharge medications or the care you received while you were in the hospital after you are discharged, you can call the unit and asked to speak with the hospitalist on call if the hospitalist that took care of you is not available. Once you are discharged, your primary care physician will handle any further medical issues. Please note that NO REFILLS for any discharge medications will be authorized once you are discharged, as it is imperative that you return to your primary care physician (or establish a relationship with a primary care physician if you do not have one) for your aftercare needs so that they can reassess your need for medications and monitor your lab values.  Discharge Instructions   Call MD for:  severe uncontrolled pain    Complete by:  As directed      Call MD for:  temperature >100.4    Complete by:  As directed      Diet - low sodium heart healthy    Complete by:  As directed      Discharge instructions    Complete by:  As directed   Pt is to continue physical therapy while at facility.  Also should follow up with pcp within 1 week post discharge.     Increase activity slowly    Complete by:  As directed           Current Discharge Medication List    START taking these medications   Details  cephALEXin (KEFLEX) 500 MG capsule Take 1 capsule (500 mg total) by mouth 2 (two) times  daily. Qty: 14 capsule, Refills: 0    oxyCODONE-acetaminophen (PERCOCET/ROXICET) 5-325 MG per tablet Take 1 tablet by mouth every 4 (four) hours as needed for moderate pain. Qty: 30 tablet, Refills: 0      CONTINUE these medications which have NOT CHANGED   Details  docusate sodium (COLACE) 100 MG capsule Take 100 mg by mouth 2 (two) times daily as needed for moderate constipation.     enalapril (VASOTEC) 2.5 MG tablet Take 2.5 mg by mouth daily.    fish oil-omega-3 fatty acids 1000 MG capsule Take 2 g by mouth daily.    ibuprofen (ADVIL,MOTRIN) 200 MG tablet Take 600 mg by mouth every 6 (six) hours as needed for moderate pain.    levothyroxine (SYNTHROID, LEVOTHROID) 50 MCG tablet Take 50 mcg by mouth daily before breakfast.    metoprolol tartrate (LOPRESSOR) 25 MG tablet Take 25 mg by mouth daily.    Multiple Vitamin (MULTIVITAMIN WITH MINERALS) TABS Take 1 tablet by mouth daily.    Neomycin-Bacitracin-Polymyxin (NEOSPORIN EX) Apply 1 application topically as needed. To shoulder    omeprazole (PRILOSEC) 20 MG capsule Take 20 mg by mouth daily.    simvastatin (ZOCOR) 80 MG tablet Take 1 tablet (80 mg total) by mouth at bedtime. Qty: 30 tablet, Refills: 9    thiamine (VITAMIN B-1) 100 MG tablet Take 100 mg by mouth daily.    vitamin B-12 (CYANOCOBALAMIN) 1000 MCG tablet Take 1,000 mcg by mouth daily.    vitamin C (ASCORBIC ACID) 500 MG tablet Take 500 mg by mouth daily.    vitamin E 400 UNIT capsule Take 400 Units by mouth daily.      STOP taking these medications     Menthol, Topical Analgesic, (BENGAY EX)        No Known Allergies    The results of significant diagnostics from this hospitalization (including imaging, microbiology, ancillary and laboratory) are listed below for reference.    Significant Diagnostic Studies: Dg Lumbar Spine Complete  06/25/2014   CLINICAL DATA:  Patient fell on 06/17/2013 at his house wall climbing through a window. Right-sided  hip and low back pain.  EXAM: LUMBAR SPINE - COMPLETE 4+ VIEW  COMPARISON:  None.  FINDINGS: There are 5 nonrib bearing lumbar-type vertebral bodies. There is an L2 vertebral body compression fracture with approximately 50% height loss. There is a T12 vertebral body compression fracture with approximately 50% height loss. The remainder the vertebral body heights are maintained. The alignment is anatomic. There is no spondylolysis. There is no static listhesis. There is degenerative disc disease most severe at L4-5.  The SI joints are unremarkable.  There is abdominal aortic atherosclerosis.  IMPRESSION: 1. T12 and L2 vertebral body compression fractures. Although these are technically age indeterminate, but given the mechanism injury these are most concerning for acute fractures.   Electronically Signed   By: Elige Ko   On: 06/25/2014 18:48   Dg  Pelvis 1-2 Views  06/25/2014   CLINICAL DATA:  Status post fall 06/17/2014. Right hip and low back pain.  EXAM: PELVIS - 1-2 VIEW  COMPARISON:  CT abdomen and pelvis 05/28/2011.  FINDINGS: The hips are located. No fracture is identified. No notable degenerative change is seen about the hips. The symphysis pubis and sacroiliac joints appear normal. Lower lumbar spondylosis is noted.  IMPRESSION: No acute finding.   Electronically Signed   By: Drusilla Kanner M.D.   On: 06/25/2014 18:48   Ct Head Wo Contrast  06/17/2014   CLINICAL DATA:  Attempted to crawl into a house through a window. She was climbing on a trash can when she fell to the ground. Lacerations to the frontal and posterior scalp. No loss of consciousness. The patient also had a fall in the grass yesterday.  EXAM: CT HEAD WITHOUT CONTRAST  CT CERVICAL SPINE WITHOUT CONTRAST  TECHNIQUE: Multidetector CT imaging of the head and cervical spine was performed following the standard protocol without intravenous contrast. Multiplanar CT image reconstructions of the cervical spine were also generated.   COMPARISON:  CT head without contrast 05/28/2011  FINDINGS: CT HEAD FINDINGS  Atrophy and white matter disease is stable compared to the prior exam. No acute cortical infarct, hemorrhage, or mass lesion is present. Skin staples are present within the right occipital scalp. There is a left supraorbital scalp laceration. There is no underlying fracture. The paranasal sinuses and mastoid air cells are clear. Atherosclerotic calcifications are noted within the cavernous carotid arteries.  CT CERVICAL SPINE FINDINGS  The cervical spine is imaged from the skullbase through T1-2. Vertebral body heights are maintained. Slight degenerative anterolisthesis is present at C4-5. Alignment is otherwise anatomic. There is chronic loss of disc height at C5-6 and C6-7 with uncovertebral spurring at both levels. Osseous foraminal narrowing is present bilaterally. The soft tissues demonstrate atherosclerotic calcifications within the cavernous carotid arteries bilaterally. The lung apices are clear.  IMPRESSION: 1. Right occipital and left supraorbital scalp lacerations without underlying fractures. 2. Stable atrophy and white matter disease. 3. No acute intracranial abnormality. 4. Moderate degenerative changes within the cervical spine. 5. No acute fracture or traumatic subluxation in the cervical spine.   Electronically Signed   By: Gennette Pac M.D.   On: 06/17/2014 20:28   Ct Cervical Spine Wo Contrast  06/17/2014   CLINICAL DATA:  Attempted to crawl into a house through a window. She was climbing on a trash can when she fell to the ground. Lacerations to the frontal and posterior scalp. No loss of consciousness. The patient also had a fall in the grass yesterday.  EXAM: CT HEAD WITHOUT CONTRAST  CT CERVICAL SPINE WITHOUT CONTRAST  TECHNIQUE: Multidetector CT imaging of the head and cervical spine was performed following the standard protocol without intravenous contrast. Multiplanar CT image reconstructions of the cervical  spine were also generated.  COMPARISON:  CT head without contrast 05/28/2011  FINDINGS: CT HEAD FINDINGS  Atrophy and white matter disease is stable compared to the prior exam. No acute cortical infarct, hemorrhage, or mass lesion is present. Skin staples are present within the right occipital scalp. There is a left supraorbital scalp laceration. There is no underlying fracture. The paranasal sinuses and mastoid air cells are clear. Atherosclerotic calcifications are noted within the cavernous carotid arteries.  CT CERVICAL SPINE FINDINGS  The cervical spine is imaged from the skullbase through T1-2. Vertebral body heights are maintained. Slight degenerative anterolisthesis is present at C4-5. Alignment is  otherwise anatomic. There is chronic loss of disc height at C5-6 and C6-7 with uncovertebral spurring at both levels. Osseous foraminal narrowing is present bilaterally. The soft tissues demonstrate atherosclerotic calcifications within the cavernous carotid arteries bilaterally. The lung apices are clear.  IMPRESSION: 1. Right occipital and left supraorbital scalp lacerations without underlying fractures. 2. Stable atrophy and white matter disease. 3. No acute intracranial abnormality. 4. Moderate degenerative changes within the cervical spine. 5. No acute fracture or traumatic subluxation in the cervical spine.   Electronically Signed   By: Gennette Pachris  Mattern M.D.   On: 06/17/2014 20:28   Ct Lumbar Spine Wo Contrast  06/25/2014   CLINICAL DATA:  Status post fall 06/17/2014.  Back pain.  EXAM: CT LUMBAR SPINE WITHOUT CONTRAST  TECHNIQUE: Multidetector CT imaging of the lumbar spine was performed without intravenous contrast administration. Multiplanar CT image reconstructions were also generated.  COMPARISON:  Plain films lumbar spine or this same day.  FINDINGS: As seen on comparison plain films, the patient has compression fractures of T12 and L2. The T12 fracture involves inferior endplate and demonstrates  approximately 90% vertebral body height loss. There is mild bony retropulsion off the inferior endplate. While the fracture cannot be definitively characterized, it appears remote with smooth margins present.  The patient's L2 compression fracture involves the superior endplate with vertebral plana deformity and mild comminution present. The fracture does not involve the posterior elements. This fracture has sharp margins suggesting an acute injury. There is bony retropulsion off the superior endplate resulting in moderate to moderately severe central canal stenosis. The neural foramina appear open.  Except described above, vertebral body height is maintained. No other fracture is seen. Dependent atelectasis is seen in the lung bases. Atherosclerotic vascular disease of the aorta without aneurysm is noted. Imaged intra-abdominal contents are otherwise unremarkable.  The T10-11 and T11-12 levels are unremarkable.  T12-L1: Facet degenerative disease is present. The central canal and foramina appear open.  T12-L1: Mild bony retropulsion off the inferior endplate of T12 is present but the central canal and foramina appear open  L1-2: As described above.  L2-3: Minimal disc bulge and mild facet degenerative change without central canal or foraminal stenosis.  L3-4: Shallow disc bulge and ligamentum flavum thickening. Central spinal canal and foramina appear open.  L4-5: There is facet arthropathy with some ligamentum flavum thickening. Marked loss of disc space height is present but the central canal and foramina appear open.  L5-S1: Left laminotomy defect is seen. The central canal and foramina appear open with facet arthropathy noted.  IMPRESSION: Acute appearing L2 compression fracture with bony retropulsion off the superior endplate resulting in moderate to moderately severe central canal stenosis. No extension into the posterior elements is identified.  Inferior endplate compression fracture of T12 appears remote  although it cannot be definitively characterized and demonstrates mild bony retropulsion off the inferior endplate without resultant central canal stenosis.   Electronically Signed   By: Drusilla Kannerhomas  Dalessio M.D.   On: 06/25/2014 20:50   Ct Hip Right Wo Contrast  06/27/2014   CLINICAL DATA:  Status post fall 06/17/2014 with continued right hip pain. Subsequent encounter.  EXAM: CT OF THE RIGHT HIP WITHOUT CONTRAST  TECHNIQUE: Multidetector CT imaging of the right hip was performed according to the standard protocol. Multiplanar CT image reconstructions were also generated.  COMPARISON:  Plain film of the pelvis recess Single view of the pelvis 06/25/2014.  FINDINGS: The right hip is located. No fracture is identified. No notable  degenerative change is seen about the hip. No fracture is identified. Small right hip joint effusion is noted. Visualized musculature about the right hip appears normal. There is partial visualization of a left inguinal hernia containing bowel. Atherosclerotic calcifications are noted.  IMPRESSION: Negative for fracture or dislocation. No acute abnormality is identified. If the patient refuses to bear weight or pain persists, MRI would be useful for further evaluation.  Small right hip joint effusion.   Electronically Signed   By: Drusilla Kannerhomas  Dalessio M.D.   On: 06/27/2014 12:10    Microbiology: No results found for this or any previous visit (from the past 240 hour(s)).   Labs: Basic Metabolic Panel:  Recent Labs Lab 06/26/14 0100 06/26/14 0500 06/27/14 0540  NA 133* 135* 136*  K 3.5* 3.9 3.9  CL 101 100 102  CO2  --  23 23  GLUCOSE 122* 93 98  BUN 18 17 17   CREATININE 0.90 0.85 0.93  CALCIUM  --  8.4 8.2*  MG  --  2.0  --   PHOS  --  3.1  --    Liver Function Tests:  Recent Labs Lab 06/26/14 0500  AST 24  ALT 19  ALKPHOS 84  BILITOT 0.5  PROT 5.9*  ALBUMIN 2.9*   No results found for this basename: LIPASE, AMYLASE,  in the last 168 hours No results found  for this basename: AMMONIA,  in the last 168 hours CBC:  Recent Labs Lab 06/26/14 0050 06/26/14 0100 06/26/14 0500 06/27/14 0540  WBC 9.4  --  9.2 7.6  NEUTROABS  --   --  7.0  --   HGB 12.9* 13.3 11.6* 11.9*  HCT 37.5* 39.0 34.3* 35.3*  MCV 90.8  --  90.7 91.5  PLT 306  --  304 325   Cardiac Enzymes: No results found for this basename: CKTOTAL, CKMB, CKMBINDEX, TROPONINI,  in the last 168 hours BNP: BNP (last 3 results) No results found for this basename: PROBNP,  in the last 8760 hours CBG: No results found for this basename: GLUCAP,  in the last 168 hours     Signed:  Penny PiaVEGA, Maylon Sailors  [facility] 06/29/2014, 11:38 AM

## 2014-07-06 ENCOUNTER — Non-Acute Institutional Stay (SKILLED_NURSING_FACILITY): Payer: Medicare Other | Admitting: Internal Medicine

## 2014-07-06 DIAGNOSIS — S22020D Wedge compression fracture of second thoracic vertebra, subsequent encounter for fracture with routine healing: Secondary | ICD-10-CM

## 2014-07-06 DIAGNOSIS — E039 Hypothyroidism, unspecified: Secondary | ICD-10-CM

## 2014-07-06 DIAGNOSIS — S43421A Sprain of right rotator cuff capsule, initial encounter: Secondary | ICD-10-CM
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
## 2014-07-13 ENCOUNTER — Non-Acute Institutional Stay (SKILLED_NURSING_FACILITY): Payer: Medicare Other | Admitting: Internal Medicine

## 2014-07-13 DIAGNOSIS — S22020D Wedge compression fracture of second thoracic vertebra, subsequent encounter for fracture with routine healing: Secondary | ICD-10-CM

## 2014-07-13 DIAGNOSIS — G309 Alzheimer's disease, unspecified: Secondary | ICD-10-CM

## 2014-07-13 DIAGNOSIS — F0280 Dementia in other diseases classified elsewhere without behavioral disturbance: Secondary | ICD-10-CM

## 2014-07-15 NOTE — Progress Notes (Addendum)
Patient ID: Devin Clark, male   DOB: 02-Dec-1920, 80 y.o.   MRN: [id number]               PROGRESS NOTE  DATE:  07/13/2014    FACILITY: [facility]    LEVEL OF CARE:   SNF   Acute Visit/Discharge Visit      CHIEF COMPLAINT:  Pre-discharge review.     HISTORY OF PRESENT ILLNESS:  This is a 80 year-old man who came to [place] after apparently locking himself out of the house.  He fell while trying to climb in the window.  He suffered an L2 compression fracture and was fairly immobilized by this.  He was seen by Neurosurgery.  They did not feel that there was a surgical option.  He was discharged here with a fairly extensive brace.    He also has atrial fibrillation.  He is not a candidate for anticoagulation due to falling.    When I saw him last week, I thought he had a problem with the left shoulder.  I did an x-ray, although I do not see these results.  I wondered whether there was a rotator cuff tear.    In the meantime, he is not felt to be really safe by Occupational Therapy, Speech Therapy; certainly not safe enough for total independent living.  They have given him a walker, although he does not use that very well.  For instance, he was asked to back up in the walker and sit on the side of the bed.  He literally threw it to the side and almost fell in the process.    With regards to his social status, he lives in his own home.  He has two children, although I do not think he keeps in contact.  He has a younger neighbor who is helping him in some way.  I am not sure whether she has legal paperwork or not.  I have not met her as of yet.  There was some suggestion that she was helping manage his finances.     PHYSICAL EXAMINATION:   GENERAL APPEARANCE:  The patient seems somewhat agitated about something.  He is begging me literally to go home, wants to pay me to arrange it.   CHEST/RESPIRATORY:  Clear air entry bilaterally.   CARDIOVASCULAR:  CARDIAC:   Heart sounds are normal.     GASTROINTESTINAL:  ABDOMEN:   No masses.  No tenderness.   NEUROLOGICAL:    BALANCE/GAIT:  I watched him walk down the hall with a walker.  He does fairly well, although he is certainly not very safe and does not listen to the instructions given to him by the therapist.   PSYCHIATRIC:   MENTAL STATUS:   He scores 22/30 on the Folstein mini-mental status.   He is orientated for the most part.  He remembered 3/3 objects.  He does not do well on the clock test.  He could not name the President.  He cannot tell me his street address today.    ASSESSMENT/PLAN:  Dementia.   He is listed in the record as having Alzheimer's disease.  I think this is probably accurate.   There are notes from Dr. Walker Kehr dating back to 2012.  Currently, I would stage this as mild to moderate.  By itself, he is probably not incompetent to make decisions, although there are concerns about him being totally independent.    Compression fracture: this is improved. His pain is stable. I  am doubtful he will this brace at home  Apparently, his responsible party is coming into the building this afternoon.  I will try to talk with her.  If she is managing his finances, I wonder about his medications.  He probably should not be driving, or at least be assessed for his safety.    I am sure he will not wear the brace that he comes with at home.  Therapy is suggesting a rolling walker, which I will try to arrange.    His medications are another thing that I am somewhat concerned about.  Ordinarily, I think a patient like this should be started on an acetylcholinesterase inhibitor, although I am not certain he will comply with any of this.    99316(discharge > 30 minutes).         ADDENDUM:    I have spoken with the young woman who is his informal caregiver.  She tells me that the patient was independent in all aspects, taking his own medications, paying his own bills.  I find this somewhat surprising.   I talked to her about the  implications of scoring in the low 20s out of 30 on the Folstein.  She is willing to support him at home, up to having him come to live with her.  I did express my concern about driving and she promises to talk to him about that.

## 2014-07-21 ENCOUNTER — Encounter: Payer: Self-pay | Admitting: *Deleted

## 2015-08-02 ENCOUNTER — Encounter: Payer: Medicare Other | Admitting: Cardiovascular Disease

## 2015-08-02 DIAGNOSIS — R0989 Other specified symptoms and signs involving the circulatory and respiratory systems: Secondary | ICD-10-CM

## 2015-08-03 ENCOUNTER — Telehealth: Payer: Self-pay | Admitting: Cardiovascular Disease

## 2015-08-03 NOTE — Telephone Encounter (Signed)
Called pt. to ask why he was not able to make it to his appt with Dr. Royann Shiversroitoru on 08/02/15. Pt hung up on me.

## 2016-02-19 ENCOUNTER — Emergency Department (HOSPITAL_COMMUNITY): Payer: Medicare Other

## 2016-02-19 ENCOUNTER — Encounter (HOSPITAL_COMMUNITY): Payer: Self-pay

## 2016-02-19 ENCOUNTER — Emergency Department (HOSPITAL_COMMUNITY)
Admission: EM | Admit: 2016-02-19 | Discharge: 2016-02-19 | Disposition: A | Discharge status: Home / Self Care | Payer: Medicare Other | Attending: Emergency Medicine | Admitting: Emergency Medicine

## 2016-02-19 DIAGNOSIS — W182XXA Fall in (into) shower or empty bathtub, initial encounter: Secondary | ICD-10-CM | POA: Insufficient documentation

## 2016-02-19 DIAGNOSIS — Y9389 Activity, other specified: Secondary | ICD-10-CM | POA: Insufficient documentation

## 2016-02-19 DIAGNOSIS — Z955 Presence of coronary angioplasty implant and graft: Secondary | ICD-10-CM | POA: Insufficient documentation

## 2016-02-19 DIAGNOSIS — Z791 Long term (current) use of non-steroidal anti-inflammatories (NSAID): Secondary | ICD-10-CM | POA: Diagnosis not present

## 2016-02-19 DIAGNOSIS — E039 Hypothyroidism, unspecified: Secondary | ICD-10-CM | POA: Insufficient documentation

## 2016-02-19 DIAGNOSIS — I2510 Atherosclerotic heart disease of native coronary artery without angina pectoris: Secondary | ICD-10-CM | POA: Diagnosis not present

## 2016-02-19 DIAGNOSIS — I10 Essential (primary) hypertension: Secondary | ICD-10-CM | POA: Insufficient documentation

## 2016-02-19 DIAGNOSIS — I4891 Unspecified atrial fibrillation: Secondary | ICD-10-CM | POA: Insufficient documentation

## 2016-02-19 DIAGNOSIS — Y999 Unspecified external cause status: Secondary | ICD-10-CM | POA: Diagnosis not present

## 2016-02-19 DIAGNOSIS — Y9289 Other specified places as the place of occurrence of the external cause: Secondary | ICD-10-CM | POA: Diagnosis not present

## 2016-02-19 DIAGNOSIS — Z87891 Personal history of nicotine dependence: Secondary | ICD-10-CM | POA: Insufficient documentation

## 2016-02-19 DIAGNOSIS — Z792 Long term (current) use of antibiotics: Secondary | ICD-10-CM | POA: Diagnosis not present

## 2016-02-19 DIAGNOSIS — M25512 Pain in left shoulder: Secondary | ICD-10-CM | POA: Diagnosis not present

## 2016-02-19 DIAGNOSIS — W19XXXA Unspecified fall, initial encounter: Secondary | ICD-10-CM

## 2016-02-19 DIAGNOSIS — M545 Low back pain: Secondary | ICD-10-CM | POA: Diagnosis present

## 2016-02-19 DIAGNOSIS — Z79899 Other long term (current) drug therapy: Secondary | ICD-10-CM | POA: Diagnosis not present

## 2016-02-19 LAB — I-STAT CHEM 8, ED
BUN: 31 mg/dL — AB (ref 6–20)
CHLORIDE: 103 mmol/L (ref 101–111)
CREATININE: 1.3 mg/dL — AB (ref 0.61–1.24)
Calcium, Ion: 1.25 mmol/L (ref 1.13–1.30)
Glucose, Bld: 119 mg/dL — ABNORMAL HIGH (ref 65–99)
HEMATOCRIT: 43 % (ref 39.0–52.0)
Hemoglobin: 14.6 g/dL (ref 13.0–17.0)
POTASSIUM: 4.4 mmol/L (ref 3.5–5.1)
Sodium: 138 mmol/L (ref 135–145)
TCO2: 24 mmol/L (ref 0–100)

## 2016-02-19 MED ORDER — SODIUM CHLORIDE 0.9 % IV BOLUS (SEPSIS)
500.0000 mL | Freq: Once | INTRAVENOUS | Status: AC
  Administered 2016-02-19: 500 mL via INTRAVENOUS

## 2016-02-19 NOTE — Discharge Instructions (Signed)
Fall Prevention in the Home  Falls can cause injuries and can affect people from all age groups. There are many simple things that you can do to make your home safe and to help prevent falls. WHAT CAN I DO ON THE OUTSIDE OF MY HOME?  Regularly repair the edges of walkways and driveways and fix any cracks.  Remove high doorway thresholds.  Trim any shrubbery on the main path into your home.  Use bright outdoor lighting.  Clear walkways of debris and clutter, including tools and rocks.  Regularly check that handrails are securely fastened and in good repair. Both sides of any steps should have handrails.  Install guardrails along the edges of any raised decks or porches.  Have leaves, snow, and ice cleared regularly.  Use sand or salt on walkways during winter months.  In the garage, clean up any spills right away, including grease or oil spills. WHAT CAN I DO IN THE BATHROOM?  Use night lights.  Install grab bars by the toilet and in the tub and shower. Do not use towel bars as grab bars.  Use non-skid mats or decals on the floor of the tub or shower.  If you need to sit down while you are in the shower, use a plastic, non-slip stool..  Keep the floor dry. Immediately clean up any water that spills on the floor.  Remove soap buildup in the tub or shower on a regular basis.  Attach bath mats securely with double-sided non-slip rug tape.  Remove throw rugs and other tripping hazards from the floor. WHAT CAN I DO IN THE BEDROOM?  Use night lights.  Make sure that a bedside light is easy to reach.  Do not use oversized bedding that drapes onto the floor.  Have a firm chair that has side arms to use for getting dressed.  Remove throw rugs and other tripping hazards from the floor. WHAT CAN I DO IN THE KITCHEN?   Clean up any spills right away.  Avoid walking on wet floors.  Place frequently used items in easy-to-reach places.  If you need to reach for something  above you, use a sturdy step stool that has a grab bar.  Keep electrical cables out of the way.  Do not use floor polish or wax that makes floors slippery. If you have to use wax, make sure that it is non-skid floor wax.  Remove throw rugs and other tripping hazards from the floor. WHAT CAN I DO IN THE STAIRWAYS?  Do not leave any items on the stairs.  Make sure that there are handrails on both sides of the stairs. Fix handrails that are broken or loose. Make sure that handrails are as long as the stairways.  Check any carpeting to make sure that it is firmly attached to the stairs. Fix any carpet that is loose or worn.  Avoid having throw rugs at the top or bottom of stairways, or secure the rugs with carpet tape to prevent them from moving.  Make sure that you have a light switch at the top of the stairs and the bottom of the stairs. If you do not have them, have them installed. WHAT ARE SOME OTHER FALL PREVENTION TIPS?  Wear closed-toe shoes that fit well and support your feet. Wear shoes that have rubber soles or low heels.  When you use a stepladder, make sure that it is completely opened and that the sides are firmly locked. Have someone hold the ladder while you   are using it. Do not climb a closed stepladder.  Add color or contrast paint or tape to grab bars and handrails in your home. Place contrasting color strips on the first and last steps.  Use mobility aids as needed, such as canes, walkers, scooters, and crutches.  Turn on lights if it is dark. Replace any light bulbs that burn out.  Set up furniture so that there are clear paths. Keep the furniture in the same spot.  Fix any uneven floor surfaces.  Choose a carpet design that does not hide the edge of steps of a stairway.  Be aware of any and all pets.  Review your medicines with your healthcare provider. Some medicines can cause dizziness or changes in blood pressure, which increase your risk of falling. Talk  with your health care provider about other ways that you can decrease your risk of falls. This may include working with a physical therapist or trainer to improve your strength, balance, and endurance.   This information is not intended to replace advice given to you by your health care provider. Make sure you discuss any questions you have with your health care provider.   Document Released: 08/17/2002 Document Revised: 01/11/2015 Document Reviewed: 10/01/2014 Elsevier Interactive Patient Education 2016 [employer]  

## 2016-02-19 NOTE — ED Notes (Signed)
Bed: [id number] Expected date:  Expected time:  Means of arrival:  Comments: EMS fall- on ground x 2 hours

## 2016-02-19 NOTE — ED Notes (Signed)
Patient states he stepped out of the tub and was drying off and fell in the tub. Patient does not remember how he fell. Patient's neighbor states that the patient had a space heater on, and the bathroom was very hot and humid when they opened the door.

## 2016-02-19 NOTE — ED Provider Notes (Signed)
CSN: [id number]     Arrival date & time 02/19/16  1835 History   First MD Initiated Contact with Patient 02/19/16 1905     Chief Complaint  Patient presents with  . Fall  . Shoulder Pain  . Back Pain      HPI Patient states he stepped out of the tub and was drying off and fell in the tub. Patient does not remember how he fell. Patient's neighbor states that the patient had a space heater on, and the bathroom was very hot and humid when they opened the door Past Medical History  Diagnosis Date  . Hypertension   . CAD (coronary artery disease)   . Stented coronary artery 1998  . Sinus node dysfunction (HCC)     St.Jude pacemaker 06/05/2011  . Atrial fibrillation (HCC)   . Dyslipidemia   . Compression fracture 2015  . Hypothyroidism    Past Surgical History  Procedure Laterality Date  . Cataract extraction, bilateral  2011  . Patch perforated duodenal ulcer  05/28/11    Cheree Ditto patch  . Pacemaker insertion  06/05/11    St.Jude Accent DR  . Chest tube insertion  06/06/11    right iatrogenic pneumothorax  . Coronary angioplasty with stent placement  12/30/1996    stent RCA  . Cardiac catheterization  03/07/2000    100% CX,widely patent stent in RCA  . Cardiac catheterization  03/26/2007    100% CX,widely patnet stent in RCA  . Nm myoview ltd  03/04/2007    High risk,mild-mod anterolateral/inferolateral ischemia   Family History  Problem Relation Age of Onset  . Heart failure Mother   . Heart failure Father   . Heart failure Sister    Social History  Substance Use Topics  . Smoking status: Former Smoker -- 1 years    Types: Cigarettes  . Smokeless tobacco: Never Used  . Alcohol Use: No    Review of Systems  All other systems reviewed and are negative.     Allergies  Review of patient's allergies indicates no known allergies.  Home Medications   Prior to Admission medications   Medication Sig Start Date End Date Taking? Authorizing Provider  docusate sodium  (COLACE) 100 MG capsule Take 100 mg by mouth 2 (two) times daily as needed for moderate constipation.    Yes Historical Provider, MD  fish oil-omega-3 fatty acids 1000 MG capsule Take 2 g by mouth daily.   Yes Historical Provider, MD  ibuprofen (ADVIL,MOTRIN) 200 MG tablet Take 600 mg by mouth every 6 (six) hours as needed for moderate pain.   Yes Historical Provider, MD  levothyroxine (SYNTHROID, LEVOTHROID) 50 MCG tablet Take 50 mcg by mouth daily before breakfast.   Yes Historical Provider, MD  metoprolol tartrate (LOPRESSOR) 25 MG tablet Take 25 mg by mouth daily.   Yes Historical Provider, MD  Multiple Vitamin (MULTIVITAMIN WITH MINERALS) TABS Take 1 tablet by mouth daily.   Yes Historical Provider, MD  Neomycin-Bacitracin-Polymyxin (NEOSPORIN EX) Apply 1 application topically as needed. To shoulder   Yes Historical Provider, MD  omeprazole (PRILOSEC) 20 MG capsule Take 20 mg by mouth daily.   Yes Historical Provider, MD  thiamine (VITAMIN B-1) 100 MG tablet Take 100 mg by mouth daily.   Yes Historical Provider, MD  vitamin B-12 (CYANOCOBALAMIN) 1000 MCG tablet Take 1,000 mcg by mouth daily.   Yes Historical Provider, MD  vitamin C (ASCORBIC ACID) 500 MG tablet Take 500 mg by mouth daily.   Yes  Historical Provider, MD  vitamin E 400 UNIT capsule Take 400 Units by mouth daily.   Yes Historical Provider, MD  simvastatin (ZOCOR) 80 MG tablet Take 1 tablet (80 mg total) by mouth at bedtime. Patient not taking: Reported on 02/19/2016 11/09/13   Mihai Croitoru, MD   BP 127/89 mmHg  Pulse 58  Temp(Src) 97.8 F (36.6 C) (Oral)  Resp 14  SpO2 99% Physical Exam  Constitutional: He is oriented to person, place, and time. He appears well-developed and well-nourished. No distress.  HENT:  Head: Normocephalic and atraumatic.  Eyes: Pupils are equal, round, and reactive to light.  Neck: Normal range of motion.  Cardiovascular: Normal rate and intact distal pulses.   Pulmonary/Chest: No respiratory  distress.  Abdominal: Normal appearance. He exhibits no distension.  Musculoskeletal:       Left shoulder: He exhibits decreased range of motion and tenderness. He exhibits no bony tenderness and no crepitus.       Back:  Neurological: He is alert and oriented to person, place, and time. No cranial nerve deficit.  Skin: Skin is warm and dry. No rash noted.  Psychiatric: He has a normal mood and affect. His behavior is normal.  Nursing note and vitals reviewed.   ED Course  Procedures (including critical care time) Medications  sodium chloride 0.9 % bolus 500 mL (0 mLs Intravenous Stopped 02/19/16 2133)    Labs Review Labs Reviewed  I-STAT CHEM 8, ED - Abnormal; Notable for the following:    BUN 31 (*)    Creatinine, Ser 1.30 (*)    Glucose, Bld 119 (*)    All other components within normal limits    Imaging Review Dg Lumbar Spine Complete  02/19/2016  CLINICAL DATA:  Larey Seat in tub, with lower back pain. Initial encounter. EXAM: LUMBAR SPINE - COMPLETE 4+ VIEW COMPARISON:  CT of the lumbar spine performed 06/25/2014 FINDINGS: Compression deformities of vertebral bodies T12 and L2 are relatively stable from 2015, with stable associated retropulsion. There appears to be mildly increased disc space narrowing at L1-L2, and stable disc space narrowing at L4-L5. Scattered vascular calcifications are seen. The visualized bowel gas pattern is grossly unremarkable. The sacroiliac joints are grossly unremarkable in appearance. IMPRESSION: 1. No evidence of acute fracture or subluxation along the lumbar spine. 2. Compression deformities of vertebral bodies T12 and L2 are relatively stable from 2015, with stable associated retropulsion. Mildly increased disc space narrowing at L1-L2, and stable disc space narrowing at L4-L5. Electronically Signed   By: Roanna Raider M.D.   On: 02/19/2016 20:06   Ct Head Wo Contrast  02/19/2016  CLINICAL DATA:  Fall in bathtub, possible head injury EXAM: CT HEAD  WITHOUT CONTRAST TECHNIQUE: Contiguous axial images were obtained from the base of the skull through the vertex without intravenous contrast. COMPARISON:  06/17/2014 FINDINGS: The brainstem, cerebellum, cerebral peduncles, thalami, basal ganglia, basilar cisterns, and ventricular system appear within normal limits. Periventricular white matter and corona radiata hypodensities favor chronic ischemic microvascular white matter disease. No intracranial hemorrhage, mass lesion, or acute CVA. There is atherosclerotic calcification of the cavernous carotid arteries bilaterally. Visualized paranasal sinuses appear clear. IMPRESSION: 1. No acute intracranial findings. 2. Periventricular white matter and corona radiata hypodensities favor chronic ischemic microvascular white matter disease. Electronically Signed   By: Gaylyn Rong M.D.   On: 02/19/2016 20:30   Dg Shoulder Left  02/19/2016  CLINICAL DATA:  Pt fell in tub today. Pt hit left shoulder and lower back during  fall. Pt has red abrasion on left shoulder and says his lower back hurts worse now than it has in last 2-3 years. EXAM: LEFT SHOULDER - 2+ VIEW COMPARISON:  03/18/2012 FINDINGS: Elevation of the humeral head consistent with chronic rotator cuff tear. There is also glenohumeral arthritis. There is no evidence of humeral fracture. No evidence of anterior or posterior dislocation of the Y-view. Elevation of the humeral head is new when compared to the prior study. IMPRESSION: Humeral elevation suggesting chronic rotator cuff tear. Electronically Signed   By: Esperanza Heiraymond  Rubner M.D.   On: 02/19/2016 19:56   I have personally reviewed and evaluated these images and lab results as part of my medical decision-making.   EKG Interpretation   Date/Time:  Sunday February 19 2016 20:01:27 EDT Ventricular Rate:  63 PR Interval:  88 QRS Duration: 158 QT Interval:  477 QTC Calculation: 488 R Axis:   -50 Text Interpretation:  Ventricular-paced rhythm No  further analysis  attempted due to paced rhythm Confirmed by Radford PaxBEATON  MD, Samiah Ricklefs ([id number]) on  02/19/2016 8:52:29 PM      MDM   Final diagnoses:  Fall, initial encounter        Nelva Nayobert Lorae Roig, MD 02/19/16 2314

## 2016-02-19 NOTE — ED Notes (Signed)
Per EMS- Patient was found in the bath tub. Patient's neighbor checks on the patient frequently, but patient did not pick up the phone for a couple of hours. Patient's neighbor called EMS and when EMS arrived they found the patient in the tub. Patient c/o left shoulder pain and bilateral low back pain.

## 2016-10-03 ENCOUNTER — Emergency Department (HOSPITAL_COMMUNITY)
Admission: EM | Admit: 2016-10-03 | Discharge: 2016-10-03 | Disposition: A | Discharge status: Home / Self Care | Payer: Medicare Other | Attending: Internal Medicine | Admitting: Internal Medicine

## 2016-10-03 ENCOUNTER — Emergency Department (HOSPITAL_COMMUNITY): Payer: Medicare Other

## 2016-10-03 ENCOUNTER — Encounter (HOSPITAL_COMMUNITY): Payer: Self-pay | Admitting: *Deleted

## 2016-10-03 DIAGNOSIS — S20212A Contusion of left front wall of thorax, initial encounter: Secondary | ICD-10-CM

## 2016-10-03 DIAGNOSIS — I2510 Atherosclerotic heart disease of native coronary artery without angina pectoris: Secondary | ICD-10-CM | POA: Diagnosis not present

## 2016-10-03 DIAGNOSIS — Z950 Presence of cardiac pacemaker: Secondary | ICD-10-CM | POA: Insufficient documentation

## 2016-10-03 DIAGNOSIS — Z87891 Personal history of nicotine dependence: Secondary | ICD-10-CM | POA: Diagnosis not present

## 2016-10-03 DIAGNOSIS — Y92009 Unspecified place in unspecified non-institutional (private) residence as the place of occurrence of the external cause: Secondary | ICD-10-CM | POA: Insufficient documentation

## 2016-10-03 DIAGNOSIS — Z79899 Other long term (current) drug therapy: Secondary | ICD-10-CM | POA: Diagnosis not present

## 2016-10-03 DIAGNOSIS — Y999 Unspecified external cause status: Secondary | ICD-10-CM | POA: Diagnosis not present

## 2016-10-03 DIAGNOSIS — I10 Essential (primary) hypertension: Secondary | ICD-10-CM | POA: Insufficient documentation

## 2016-10-03 DIAGNOSIS — Y939 Activity, unspecified: Secondary | ICD-10-CM | POA: Diagnosis not present

## 2016-10-03 DIAGNOSIS — W01198A Fall on same level from slipping, tripping and stumbling with subsequent striking against other object, initial encounter: Secondary | ICD-10-CM | POA: Diagnosis not present

## 2016-10-03 DIAGNOSIS — E039 Hypothyroidism, unspecified: Secondary | ICD-10-CM | POA: Insufficient documentation

## 2016-10-03 DIAGNOSIS — S299XXA Unspecified injury of thorax, initial encounter: Secondary | ICD-10-CM | POA: Diagnosis present

## 2016-10-03 DIAGNOSIS — Z955 Presence of coronary angioplasty implant and graft: Secondary | ICD-10-CM | POA: Insufficient documentation

## 2016-10-03 MED ORDER — OXYCODONE HCL 5 MG PO TABS: 2.5000 mg | ORAL_TABLET | Freq: Four times a day (QID) | ORAL | 0 refills | Status: DC | PRN

## 2016-10-03 MED ORDER — ACETAMINOPHEN 500 MG PO TABS: 1000.0000 mg | ORAL_TABLET | Freq: Four times a day (QID) | ORAL | 0 refills | Status: DC | PRN

## 2016-10-03 NOTE — Discharge Instructions (Signed)
For your rib pain please take Tylenol 1000mg  every 6 hours for the next few days. You can also take oxycodone every 6 hours as needed; this medication can make you drowsy so be careful. Please use the breathing device every 15 minutes for every hours that you are awake. This will help keep your lungs open. Please follow up with your primary care provider in the next few days. Please return if you start having fevers or shortness of breath.

## 2016-10-03 NOTE — ED Notes (Signed)
ED Provider at bedside. 

## 2016-10-03 NOTE — ED Provider Notes (Signed)
[facility]-EMERGENCY DEPT Provider Note   CSN: [id number] Arrival date & time: 10/03/16  1317     History   Chief Complaint Chief Complaint  Patient presents with  . Fall    HPI Devin Clark is a 83 y.o. male with PMH of CAD, Afib, pacemaker who presents with left rib pain after a fall two days ago.   HPI Patient reports that he tripped over the border between the carpeted area and uncarpeted area of his house. He tripped, fell and hit the left side of his head on the dryer. Then he remembers waking up on the couch. The fall was not witnessed. His caregiver who lives next door reports that patient told her this occurred two days ago. She noticed that he would hold the left side of his rib with any movement. He denies having chest pain, palpitations or shortness of breath prior to his fall. Caregiver reports he has a history of falls; he does not use his walker as he should. He denies headaches or blurred vision. No shortness of breath currently. Only has pain near his left eye to palpation and no where else.   Past Medical History:  Diagnosis Date  . Atrial fibrillation (HCC)   . CAD (coronary artery disease)   . Compression fracture 2015  . Dyslipidemia   . Hypertension   . Hypothyroidism   . Sinus node dysfunction (HCC)    St[employer]ude pacemaker 06/05/2011  . Stented coronary artery 1998    Patient Active Problem List   Diagnosis Date Noted  . Lumbar compression fracture (HCC) 06/26/2014  . Compression fracture 06/26/2014  . Atrial fibrillation (HCC) 11/26/2012  . Long term (current) use of anticoagulants 11/26/2012  . Alzheimer's dementia 07/05/2011  . Cerumen impaction 07/05/2011  . Hypertension 06/13/2011  . Supraspinatus tendonitis 06/13/2011  . Hyperlipidemia 06/13/2011  . Cardiac pacemaker 06/05/2011  . Perforated duodenal ulcer (HCC) 05/28/2011    Class: Status post  . Coronary artery disease 06/12/1997    Past Surgical History:  Procedure Laterality Date  .  CARDIAC CATHETERIZATION  03/07/2000   100% CX,widely patent stent in RCA  . CARDIAC CATHETERIZATION  03/26/2007   100% CX,widely patnet stent in RCA  . CATARACT EXTRACTION, BILATERAL  2011  . CHEST TUBE INSERTION  06/06/11   right iatrogenic pneumothorax  . CORONARY ANGIOPLASTY WITH STENT PLACEMENT  12/30/1996   stent RCA  . NM MYOVIEW LTD  03/04/2007   High risk,mild-mod anterolateral/inferolateral ischemia  . PACEMAKER INSERTION  06/05/11   St.Jude Accent DR  . patch perforated duodenal ulcer  05/28/11   Cheree Ditto patch       Home Medications    Prior to Admission medications   Medication Sig Start Date End Date Taking? Authorizing Provider  acetaminophen (TYLENOL) 500 MG tablet Take 2 tablets (1,000 mg total) by mouth every 6 (six) hours as needed. 10/03/16   Palma Holter, MD  docusate sodium (COLACE) 100 MG capsule Take 100 mg by mouth 2 (two) times daily as needed for moderate constipation.     Historical Provider, MD  fish oil-omega-3 fatty acids 1000 MG capsule Take 2 g by mouth daily.    Historical Provider, MD  ibuprofen (ADVIL,MOTRIN) 200 MG tablet Take 600 mg by mouth every 6 (six) hours as needed for moderate pain.    Historical Provider, MD  levothyroxine (SYNTHROID, LEVOTHROID) 50 MCG tablet Take 50 mcg by mouth daily before breakfast.    Historical Provider, MD  metoprolol tartrate (LOPRESSOR) 25 MG  tablet Take 25 mg by mouth daily.    Historical Provider, MD  Multiple Vitamin (MULTIVITAMIN WITH MINERALS) TABS Take 1 tablet by mouth daily.    Historical Provider, MD  Neomycin-Bacitracin-Polymyxin (NEOSPORIN EX) Apply 1 application topically as needed. To shoulder    Historical Provider, MD  omeprazole (PRILOSEC) 20 MG capsule Take 20 mg by mouth daily.    Historical Provider, MD  oxyCODONE (OXY IR/ROXICODONE) 5 MG immediate release tablet Take 0.5 tablets (2.5 mg total) by mouth every 6 (six) hours as needed for severe pain. 10/03/16   Palma HolterKanishka G Khianna Blazina, MD  simvastatin  (ZOCOR) 80 MG tablet Take 1 tablet (80 mg total) by mouth at bedtime. Patient not taking: Reported on 02/19/2016 11/09/13   Thurmon FairMihai Croitoru, MD  thiamine (VITAMIN B-1) 100 MG tablet Take 100 mg by mouth daily.    Historical Provider, MD  vitamin B-12 (CYANOCOBALAMIN) 1000 MCG tablet Take 1,000 mcg by mouth daily.    Historical Provider, MD  vitamin C (ASCORBIC ACID) 500 MG tablet Take 500 mg by mouth daily.    Historical Provider, MD  vitamin E 400 UNIT capsule Take 400 Units by mouth daily.    Historical Provider, MD    Family History Family History  Problem Relation Age of Onset  . Heart failure Mother   . Heart failure Father   . Heart failure Sister     Social History Social History  Substance Use Topics  . Smoking status: Former Smoker    Years: 1.00    Types: Cigarettes  . Smokeless tobacco: Never Used  . Alcohol use No     Allergies   Patient has no known allergies.   Review of Systems Review of Systems  HENT: Negative for rhinorrhea and sore throat.   Respiratory: Positive for cough. Negative for shortness of breath.   Cardiovascular: Negative for chest pain.  Gastrointestinal: Negative for abdominal pain.  Musculoskeletal: Negative for back pain and neck pain.       Left rib pain with certain movements.   Neurological: Negative for dizziness, facial asymmetry, speech difficulty, light-headedness, numbness and headaches.     Physical Exam Updated Vital Signs BP 138/70   Pulse 63   Temp 98.1 F (36.7 C) (Oral)   Resp 18   Ht 5\' 5"  (1.651 m)   Wt 56.7 kg   SpO2 97%   BMI 20.80 kg/m   Physical Exam  Constitutional: No distress.  Frail appearing. Thin NAD  HENT:  Head: Normocephalic.  Contusion over the left supraorbital margin and the left maxillary area. Tenderness over this area.   Eyes: Conjunctivae and EOM are normal. Pupils are equal, round, and reactive to light. Right eye exhibits no discharge. Left eye exhibits no discharge.  Neck: Normal range  of motion. Neck supple.  Cardiovascular: Normal rate and regular rhythm.   Pulmonary/Chest: Effort normal. No respiratory distress.  Abdominal: Soft. Bowel sounds are normal. There is no tenderness.  Musculoskeletal:  No tenderness of spine. No tenderness to palpation of extremities. No tenderness of hip. Tenderness over the left posterior rib at the level of Rib 7-8. No ecchymosis noted. Normal strength of upper and lower extremities. Due to left rotator cuff teat (chronic), unable to lift left arm fully.   Neurological: He is alert. No cranial nerve deficit or sensory deficit.  Oriented x 2 (not to year). Per caregiver this is baseline.  Skin: Skin is warm and dry. Capillary refill takes less than 2 seconds.  echymosis on bilateral  forearms   Psychiatric: He has a normal mood and affect. His behavior is normal.     ED Treatments / Results  Labs (all labs ordered are listed, but only abnormal results are displayed) Labs Reviewed - No data to display  EKG  EKG Interpretation None       Radiology Dg Ribs Unilateral W/chest Left  Result Date: 10/03/2016 CLINICAL DATA:  Left rib pain after fall. EXAM: LEFT RIBS AND CHEST - 3+ VIEW COMPARISON:  Radiographs of July 03, 2013. FINDINGS: No fracture or other bone lesions are seen involving the ribs. Atherosclerosis of thoracic aorta is noted. Right-sided pacemaker is unchanged in position. There is no evidence of pneumothorax or pleural effusion. Both lungs are clear. Heart size and mediastinal contours are within normal limits. IMPRESSION: Normal left ribs.  No acute cardiopulmonary abnormality seen. Electronically Signed   By: Lupita Raider, M.D.   On: 10/03/2016 14:45    Procedures Procedures (including critical care time)  Medications Ordered in ED Medications - No data to display   Initial Impression / Assessment and Plan / ED Course  I have reviewed the triage vital signs and the nursing notes.  Pertinent labs & imaging  results that were available during my care of the patient were reviewed by me and considered in my medical decision making (see chart for details).    Patient presents with left rib pain after a fall two days ago. X-ray left ribs unremarkable. Will recommend taking Tylenol 1000mg  q 6 hours. Oxycodone 2.5mg  q 6 PRN for severe pain. Provided incentive spirometer for homto use every hour he is awake. Return precautions discussed. Follow up with PCP in the next few days.   Final Clinical Impressions(s) / ED Diagnoses   Final diagnoses:  Contusion of rib on left side, initial encounter    New Prescriptions New Prescriptions   ACETAMINOPHEN (TYLENOL) 500 MG TABLET    Take 2 tablets (1,000 mg total) by mouth every 6 (six) hours as needed.   OXYCODONE (OXY IR/ROXICODONE) 5 MG IMMEDIATE RELEASE TABLET    Take 0.5 tablets (2.5 mg total) by mouth every 6 (six) hours as needed for severe pain.     Palma Holter, MD 10/03/16 1642    Melene Plan, DO 10/03/16 1645

## 2016-10-03 NOTE — ED Triage Notes (Signed)
Per caregiver- pt hit his head on the dryer after tripping on the threshhold 2 days ago. Pt states that he has pain with breathing on the left side. Pt note to have bruising to the left side of the face.

## 2017-05-22 ENCOUNTER — Emergency Department (HOSPITAL_COMMUNITY): Payer: Medicare Other

## 2017-05-22 ENCOUNTER — Emergency Department (HOSPITAL_COMMUNITY)
Admission: EM | Admit: 2017-05-22 | Discharge: 2017-05-23 | Disposition: A | Discharge status: Home / Self Care | Payer: Medicare Other | Attending: Emergency Medicine | Admitting: Emergency Medicine

## 2017-05-22 ENCOUNTER — Encounter (HOSPITAL_COMMUNITY): Payer: Self-pay | Admitting: Emergency Medicine

## 2017-05-22 DIAGNOSIS — Z87891 Personal history of nicotine dependence: Secondary | ICD-10-CM | POA: Insufficient documentation

## 2017-05-22 DIAGNOSIS — S0083XA Contusion of other part of head, initial encounter: Secondary | ICD-10-CM | POA: Diagnosis not present

## 2017-05-22 DIAGNOSIS — Y92009 Unspecified place in unspecified non-institutional (private) residence as the place of occurrence of the external cause: Secondary | ICD-10-CM | POA: Diagnosis not present

## 2017-05-22 DIAGNOSIS — G309 Alzheimer's disease, unspecified: Secondary | ICD-10-CM | POA: Diagnosis not present

## 2017-05-22 DIAGNOSIS — S0993XA Unspecified injury of face, initial encounter: Secondary | ICD-10-CM | POA: Diagnosis present

## 2017-05-22 DIAGNOSIS — Z79899 Other long term (current) drug therapy: Secondary | ICD-10-CM | POA: Insufficient documentation

## 2017-05-22 DIAGNOSIS — Y999 Unspecified external cause status: Secondary | ICD-10-CM | POA: Insufficient documentation

## 2017-05-22 DIAGNOSIS — Z950 Presence of cardiac pacemaker: Secondary | ICD-10-CM | POA: Insufficient documentation

## 2017-05-22 DIAGNOSIS — I10 Essential (primary) hypertension: Secondary | ICD-10-CM | POA: Insufficient documentation

## 2017-05-22 DIAGNOSIS — Y939 Activity, unspecified: Secondary | ICD-10-CM | POA: Insufficient documentation

## 2017-05-22 DIAGNOSIS — E039 Hypothyroidism, unspecified: Secondary | ICD-10-CM | POA: Insufficient documentation

## 2017-05-22 DIAGNOSIS — F0280 Dementia in other diseases classified elsewhere without behavioral disturbance: Secondary | ICD-10-CM | POA: Insufficient documentation

## 2017-05-22 DIAGNOSIS — W19XXXA Unspecified fall, initial encounter: Secondary | ICD-10-CM | POA: Insufficient documentation

## 2017-05-22 DIAGNOSIS — I2510 Atherosclerotic heart disease of native coronary artery without angina pectoris: Secondary | ICD-10-CM | POA: Diagnosis not present

## 2017-05-22 NOTE — ED Triage Notes (Signed)
Pt states he fell at home yesterday  Pt lives by himself  Neighbor brought him in today  Unsure how he fell but she states he uses a walker and has a wheelchair but will not use them and states he falls often

## 2017-05-22 NOTE — ED Provider Notes (Signed)
[facility]-EMERGENCY DEPT Provider Note   CSN: [id number] Arrival date & time: 05/22/17  1606     History   Chief Complaint Chief Complaint  Patient presents with  . Fall  . Facial Injury    HPI KAMILO OCH is a 83 y.o. male.  83 year old male with a history of hypertension, coronary artery disease, atrial fibrillation, dyslipidemia, and hypothyroidism presents to the emergency department from home with his neighbor who is his power of attorney. Neighbor expresses concern for increased falls. Patient recently had a fall today and struck the right side of his face. Patient denies any loss of consciousness and he persistently denies any associated pain. The patient is supposed to use a wheelchair or walker, but neglects to do so. Neighbor reports increased falls over the past few days/weeks. She is concerned about a urinary tract infection as the patient has had a strong odor to his urine. He has had an issue with urinary incontinence over the past few months. No associated fevers or reported syncopal episodes.   The history is provided by the patient and a friend. No language interpreter was used.  Fall   Facial Injury    Past Medical History:  Diagnosis Date  . Atrial fibrillation (HCC)   . CAD (coronary artery disease)   . Compression fracture 2015  . Dyslipidemia   . Hypertension   . Hypothyroidism   . Sinus node dysfunction (HCC)    St.Jude pacemaker 06/05/2011  . Stented coronary artery 1998    Patient Active Problem List   Diagnosis Date Noted  . Lumbar compression fracture (HCC) 06/26/2014  . Compression fracture 06/26/2014  . Atrial fibrillation (HCC) 11/26/2012  . Long term (current) use of anticoagulants 11/26/2012  . Alzheimer's dementia 07/05/2011  . Cerumen impaction 07/05/2011  . Hypertension 06/13/2011  . Supraspinatus tendonitis 06/13/2011  . Hyperlipidemia 06/13/2011  . Cardiac pacemaker 06/05/2011  . Perforated duodenal ulcer (HCC) 05/28/2011   Class: Status post  . Coronary artery disease 06/12/1997    Past Surgical History:  Procedure Laterality Date  . CARDIAC CATHETERIZATION  03/07/2000   100% CX,widely patent stent in RCA  . CARDIAC CATHETERIZATION  03/26/2007   100% CX,widely patnet stent in RCA  . CATARACT EXTRACTION, BILATERAL  2011  . CHEST TUBE INSERTION  06/06/11   right iatrogenic pneumothorax  . CORONARY ANGIOPLASTY WITH STENT PLACEMENT  12/30/1996   stent RCA  . NM MYOVIEW LTD  03/04/2007   High risk,mild-mod anterolateral/inferolateral ischemia  . PACEMAKER INSERTION  06/05/11   St.Jude Accent DR  . patch perforated duodenal ulcer  05/28/11   Cheree Ditto patch       Home Medications    Prior to Admission medications   Medication Sig Start Date End Date Taking? Authorizing Provider  fish oil-omega-3 fatty acids 1000 MG capsule Take 2 g by mouth daily.   Yes [provider]  levothyroxine (SYNTHROID, LEVOTHROID) 100 MCG tablet Take 100 mcg by mouth daily before breakfast.   Yes [provider]  Multiple Vitamin (MULTIVITAMIN WITH MINERALS) TABS Take 1 tablet by mouth daily.   Yes [provider]  vitamin B-12 (CYANOCOBALAMIN) 1000 MCG tablet Take 1,000 mcg by mouth daily.   Yes [provider]  vitamin C (ASCORBIC ACID) 500 MG tablet Take 500 mg by mouth daily.   Yes [provider]  vitamin E 400 UNIT capsule Take 400 Units by mouth daily.   Yes [provider]  acetaminophen (TYLENOL) 500 MG tablet Take 2 tablets (  1,000 mg total) by mouth every 6 (six) hours as needed. 10/03/16   Palma Holter, MD  ibuprofen (ADVIL,MOTRIN) 200 MG tablet Take 600 mg by mouth every 6 (six) hours as needed for moderate pain.    [provider]  Neomycin-Bacitracin-Polymyxin (NEOSPORIN EX) Apply 1 application topically as needed. To shoulder    [provider]  oxyCODONE (OXY IR/ROXICODONE) 5 MG immediate release tablet Take 0.5 tablets (2.5 mg total) by  mouth every 6 (six) hours as needed for severe pain. Patient not taking: Reported on 05/22/2017 10/03/16   Palma Holter, MD  simvastatin (ZOCOR) 80 MG tablet Take 1 tablet (80 mg total) by mouth at bedtime. Patient not taking: Reported on 02/19/2016 11/09/13   Croitoru, Rachelle Hora, MD    Family History Family History  Problem Relation Age of Onset  . Heart failure Mother   . Heart failure Father   . Heart failure Sister     Social History Social History  Substance Use Topics  . Smoking status: Former Smoker    Years: 1.00    Types: Cigarettes  . Smokeless tobacco: Never Used  . Alcohol use No     Allergies   Patient has no known allergies.   Review of Systems Review of Systems Ten systems reviewed and are negative for acute change, except as noted in the HPI.    Physical Exam Updated Vital Signs BP 137/69 (BP Location: Right Arm)   Pulse 66   Temp 97.9 F (36.6 C) (Oral)   Resp 14   SpO2 98%   Physical Exam  Constitutional: He is oriented to person, place, and time. He appears well-developed and well-nourished. No distress.  Nontoxic and in NAD  HENT:  Head: Normocephalic.  Mouth/Throat: Oropharynx is clear and moist.  Hematoma and contusion lateral to the right eye extending to the zygomatic process. No skull and stability. Unable to assess hemotympanum bilaterally secondary to cerumen impaction.  Eyes: Pupils are equal, round, and reactive to light. Conjunctivae and EOM are normal. No scleral icterus.  Neck: Normal range of motion.  No bony deformities, step-offs, or crepitus to the cervical midline.  Cardiovascular: Normal rate, regular rhythm and intact distal pulses.   Pulmonary/Chest: Effort normal. No respiratory distress. He has no wheezes. He has no rales.  Lungs CTAB  Abdominal: Soft. He exhibits no distension. There is no tenderness. There is no guarding.  Soft, nontender abdomen.  Musculoskeletal: Normal range of motion.  Neurological: He is alert  and oriented to person, place, and time. He exhibits normal muscle tone. Coordination normal.  GCS 15. Patient answers questions appropriately and follows commands. Equal grip strength bilaterally with preserved strength against resistance in all major muscle groups bilaterally. Patient reports intact sensation. He moves his extremities without ataxia.  Skin: Skin is warm and dry. No rash noted. He is not diaphoretic. No erythema. No pallor.  Abrasion to mid back  Psychiatric: He has a normal mood and affect. His behavior is normal.  Nursing note and vitals reviewed.    ED Treatments / Results  Labs (all labs ordered are listed, but only abnormal results are displayed) Labs Reviewed  URINALYSIS, ROUTINE W REFLEX MICROSCOPIC - Abnormal; Notable for the following:       Result Value   APPearance HAZY (*)    Ketones, ur 5 (*)    All other components within normal limits  I-STAT CHEM 8, ED - Abnormal; Notable for the following:    BUN 27 (*)  Calcium, Ion 1.04 (*)    Hemoglobin 11.6 (*)    HCT 34.0 (*)    All other components within normal limits    EKG  EKG Interpretation None       Radiology Ct Head Wo Contrast  Result Date: 05/23/2017 CLINICAL DATA:  Patient fell at home yesterday.  Pain. EXAM: CT HEAD WITHOUT CONTRAST CT MAXILLOFACIAL WITHOUT CONTRAST CT CERVICAL SPINE WITHOUT CONTRAST TECHNIQUE: Multidetector CT imaging of the head, cervical spine, and maxillofacial structures were performed using the standard protocol without intravenous contrast. Multiplanar CT image reconstructions of the cervical spine and maxillofacial structures were also generated. COMPARISON:  02/19/2016 FINDINGS: CT HEAD FINDINGS BRAIN: There is sulcal and ventricular prominence consistent with superficial and central atrophy. No intraparenchymal hemorrhage, mass effect nor midline shift. Periventricular and subcortical white matter hypodensities consistent with chronic small vessel ischemic disease  are identified. No acute large vascular territory infarcts. No abnormal extra-axial fluid collections. Basal cisterns are not effaced and midline. VASCULAR: Moderate calcific atherosclerosis of the carotid siphons. SKULL: No skull fracture. No significant scalp soft tissue swelling. SINUSES/ORBITS: The mastoid air-cells are clear. The included paranasal sinuses are well-aerated.The included ocular globes and orbital contents are non-suspicious. Bilateral cataract extractions. OTHER: Mild frontal scalp soft tissue swelling. CT MAXILLOFACIAL FINDINGS Osseous: No fracture or mandibular dislocation. Periodontal disease and dental caries noted of the left lower molars. No destructive process. Orbits: Bilateral cataract extractions. No traumatic or inflammatory finding. Sinuses: Minimal mucosal thickening along the floor of the maxillary sinuses. Soft tissues: Negative. CT CERVICAL SPINE FINDINGS Alignment: Mild osteoarthritic joint space narrowing of the atlantodental interval. Intact atlantodental interval and craniocervical relationship. Skull base and vertebrae: No acute fracture. No primary bone lesion or focal pathologic process. Soft tissues and spinal canal: No prevertebral fluid or swelling. No visible canal hematoma. Disc levels: Marked disc space narrowing C5-6 and moderate C6-7 with small posterior marginal osteophytes and uncovertebral osteoarthritis contributing to mild bilateral neural foraminal encroachment. Upper chest: Negative. Other: None IMPRESSION: 1. Atrophy without acute intracranial abnormality. 2. Mild forehead soft tissue swelling without underlying skull fracture. 3. Periodontal disease and dental caries of left lower molar teeth. 4. No acute maxillofacial fracture. 5. Degenerative disc disease C5-6 and C6-7. No acute cervical spine fracture. Electronically Signed   By: Tollie Ethavid  Kwon M.D.   On: 05/23/2017 00:07   Ct Cervical Spine Wo Contrast  Result Date: 05/23/2017 CLINICAL DATA:  Patient  fell at home yesterday.  Pain. EXAM: CT HEAD WITHOUT CONTRAST CT MAXILLOFACIAL WITHOUT CONTRAST CT CERVICAL SPINE WITHOUT CONTRAST TECHNIQUE: Multidetector CT imaging of the head, cervical spine, and maxillofacial structures were performed using the standard protocol without intravenous contrast. Multiplanar CT image reconstructions of the cervical spine and maxillofacial structures were also generated. COMPARISON:  02/19/2016 FINDINGS: CT HEAD FINDINGS BRAIN: There is sulcal and ventricular prominence consistent with superficial and central atrophy. No intraparenchymal hemorrhage, mass effect nor midline shift. Periventricular and subcortical white matter hypodensities consistent with chronic small vessel ischemic disease are identified. No acute large vascular territory infarcts. No abnormal extra-axial fluid collections. Basal cisterns are not effaced and midline. VASCULAR: Moderate calcific atherosclerosis of the carotid siphons. SKULL: No skull fracture. No significant scalp soft tissue swelling. SINUSES/ORBITS: The mastoid air-cells are clear. The included paranasal sinuses are well-aerated.The included ocular globes and orbital contents are non-suspicious. Bilateral cataract extractions. OTHER: Mild frontal scalp soft tissue swelling. CT MAXILLOFACIAL FINDINGS Osseous: No fracture or mandibular dislocation. Periodontal disease and dental caries noted of the left lower  molars. No destructive process. Orbits: Bilateral cataract extractions. No traumatic or inflammatory finding. Sinuses: Minimal mucosal thickening along the floor of the maxillary sinuses. Soft tissues: Negative. CT CERVICAL SPINE FINDINGS Alignment: Mild osteoarthritic joint space narrowing of the atlantodental interval. Intact atlantodental interval and craniocervical relationship. Skull base and vertebrae: No acute fracture. No primary bone lesion or focal pathologic process. Soft tissues and spinal canal: No prevertebral fluid or swelling. No  visible canal hematoma. Disc levels: Marked disc space narrowing C5-6 and moderate C6-7 with small posterior marginal osteophytes and uncovertebral osteoarthritis contributing to mild bilateral neural foraminal encroachment. Upper chest: Negative. Other: None IMPRESSION: 1. Atrophy without acute intracranial abnormality. 2. Mild forehead soft tissue swelling without underlying skull fracture. 3. Periodontal disease and dental caries of left lower molar teeth. 4. No acute maxillofacial fracture. 5. Degenerative disc disease C5-6 and C6-7. No acute cervical spine fracture. Electronically Signed   By: Tollie Eth M.D.   On: 05/23/2017 00:07   Ct Maxillofacial Wo Contrast  Result Date: 05/23/2017 CLINICAL DATA:  Patient fell at home yesterday.  Pain. EXAM: CT HEAD WITHOUT CONTRAST CT MAXILLOFACIAL WITHOUT CONTRAST CT CERVICAL SPINE WITHOUT CONTRAST TECHNIQUE: Multidetector CT imaging of the head, cervical spine, and maxillofacial structures were performed using the standard protocol without intravenous contrast. Multiplanar CT image reconstructions of the cervical spine and maxillofacial structures were also generated. COMPARISON:  02/19/2016 FINDINGS: CT HEAD FINDINGS BRAIN: There is sulcal and ventricular prominence consistent with superficial and central atrophy. No intraparenchymal hemorrhage, mass effect nor midline shift. Periventricular and subcortical white matter hypodensities consistent with chronic small vessel ischemic disease are identified. No acute large vascular territory infarcts. No abnormal extra-axial fluid collections. Basal cisterns are not effaced and midline. VASCULAR: Moderate calcific atherosclerosis of the carotid siphons. SKULL: No skull fracture. No significant scalp soft tissue swelling. SINUSES/ORBITS: The mastoid air-cells are clear. The included paranasal sinuses are well-aerated.The included ocular globes and orbital contents are non-suspicious. Bilateral cataract extractions.  OTHER: Mild frontal scalp soft tissue swelling. CT MAXILLOFACIAL FINDINGS Osseous: No fracture or mandibular dislocation. Periodontal disease and dental caries noted of the left lower molars. No destructive process. Orbits: Bilateral cataract extractions. No traumatic or inflammatory finding. Sinuses: Minimal mucosal thickening along the floor of the maxillary sinuses. Soft tissues: Negative. CT CERVICAL SPINE FINDINGS Alignment: Mild osteoarthritic joint space narrowing of the atlantodental interval. Intact atlantodental interval and craniocervical relationship. Skull base and vertebrae: No acute fracture. No primary bone lesion or focal pathologic process. Soft tissues and spinal canal: No prevertebral fluid or swelling. No visible canal hematoma. Disc levels: Marked disc space narrowing C5-6 and moderate C6-7 with small posterior marginal osteophytes and uncovertebral osteoarthritis contributing to mild bilateral neural foraminal encroachment. Upper chest: Negative. Other: None IMPRESSION: 1. Atrophy without acute intracranial abnormality. 2. Mild forehead soft tissue swelling without underlying skull fracture. 3. Periodontal disease and dental caries of left lower molar teeth. 4. No acute maxillofacial fracture. 5. Degenerative disc disease C5-6 and C6-7. No acute cervical spine fracture. Electronically Signed   By: Tollie Eth M.D.   On: 05/23/2017 00:07    Procedures Procedures (including critical care time)  Medications Ordered in ED Medications - No data to display   Initial Impression / Assessment and Plan / ED Course  I have reviewed the triage vital signs and the nursing notes.  Pertinent labs & imaging results that were available during my care of the patient were reviewed by me and considered in my medical decision making (see chart  for details).     83 year old male presents to the emergency department for further evaluation after a fall at home. Patient with a contusion to the right  side of his face. This is not associated with a facial bone fracture on CT. Also no evidence of hematoma, hydrocephalus, CVA. Laboratory workup is reassuring. No significant electrolyte derangements. Kidney function preserved. Urinalysis does not suggest infection. Strong odor likely secondary to concentrated urine, possibly from dehydration.  Patient lives in a home, alone. Though his neighbor checks on him frequently and assists with much of his care, she expresses concern about the patient being discharged back to his home. He was supposed to be placed in a long-term care facility a few months ago, but declined to go to this facility at the last minute. Have discussed outpatient primary care follow-up, but best or so of action seems to be to initiate contact with social work and care management in the emergency department. Will page for evaluation in the morning.   Patient signed out to [facility], PA-C at change of shift who will follow up on patient care and disposition appropriately.   Final Clinical Impressions(s) / ED Diagnoses   Final diagnoses:  Contusion of face, initial encounter  Fall in home, initial encounter    New Prescriptions New Prescriptions   No medications on file     Antony Madura, Cordelia Poche 05/23/17 0631    Gerhard Munch, MD 05/27/17 [phone]

## 2017-05-22 NOTE — ED Notes (Signed)
Pt Called from lobby with no response 

## 2017-05-23 LAB — I-STAT CHEM 8, ED
BUN: 27 mg/dL — ABNORMAL HIGH (ref 6–20)
CALCIUM ION: 1.04 mmol/L — AB (ref 1.15–1.40)
CHLORIDE: 105 mmol/L (ref 101–111)
CREATININE: 0.8 mg/dL (ref 0.61–1.24)
GLUCOSE: 82 mg/dL (ref 65–99)
HCT: 34 % — ABNORMAL LOW (ref 39.0–52.0)
Hemoglobin: 11.6 g/dL — ABNORMAL LOW (ref 13.0–17.0)
Potassium: 4.4 mmol/L (ref 3.5–5.1)
Sodium: 139 mmol/L (ref 135–145)
TCO2: 26 mmol/L (ref 22–32)

## 2017-05-23 LAB — URINALYSIS, ROUTINE W REFLEX MICROSCOPIC
Bilirubin Urine: NEGATIVE
GLUCOSE, UA: NEGATIVE mg/dL
HGB URINE DIPSTICK: NEGATIVE
KETONES UR: 5 mg/dL — AB
LEUKOCYTES UA: NEGATIVE
Nitrite: NEGATIVE
PH: 6 (ref 5.0–8.0)
Protein, ur: NEGATIVE mg/dL
Specific Gravity, Urine: 1.016 (ref 1.005–1.030)

## 2017-05-23 NOTE — ED Provider Notes (Signed)
Received signout from PA TolletteHumes. Refer to her note for full H&P. Briefly, patient is a 83 year old male who presents today with concern for increased falls and fell today. He is neurovascularly intact. He is neurovascularly intact. He lives alone. He should be ambulating with a walker or wheelchair. There were attempts to place him in a long-term care facility several months ago which fell through. Awaiting social work and case management consult to construct a plan for patient on discharge. Physical Exam  BP 113/68   Pulse 76   Temp 97.9 F (36.6 C) (Oral)   Resp 16   SpO2 94%   Physical Exam  Constitutional: He appears well-developed and well-nourished. No distress.  Patient resting comfortably in bed  HENT:  Head: Normocephalic.  Ecchymosis to the right side of face  Eyes: Pupils are equal, round, and reactive to light. Conjunctivae and EOM are normal. Right eye exhibits no discharge. Left eye exhibits no discharge.  Neck: Normal range of motion. Neck supple. No JVD present. No tracheal deviation present.  Cardiovascular: Normal rate, regular rhythm, normal heart sounds and intact distal pulses.  Exam reveals no gallop and no friction rub.   No murmur heard. Pulmonary/Chest: Effort normal and breath sounds normal. No respiratory distress. He has no wheezes. He has no rales. He exhibits no tenderness.  Abdominal: Soft. Bowel sounds are normal. He exhibits no distension. There is no tenderness.  Musculoskeletal: He exhibits no edema.  moves extremities spontaneously without deformity, crepitus, or edema  Neurological: He is alert.  Fluent speech, no facial droop  Skin: Skin is warm and dry. No erythema.  Psychiatric: He has a normal mood and affect. His behavior is normal.  Nursing note and vitals reviewed.   ED Course  Procedures  MDM Social work and case management seen and evaluated patient. [facility] has chosen [facility] who will be in contact with the patient  in 24-48 hours for reevaluation. On reevaluation, patient is resting comfortably with no complaints. He is stable for discharge home with follow-up with primary care physician. Discussed indications for return to the ED. Patient and patient's neighbor verbalized understanding of and agreement with plan and patient is stable for discharge home at this time.       Jeanie SewerFawze, Demarie Hyneman A, PA-C 05/23/17 1505    Melene PlanFloyd, Dan, DO 05/23/17 1601

## 2017-05-23 NOTE — ED Notes (Signed)
Patient denies pain and is resting comfortably.  

## 2017-05-23 NOTE — Discharge Planning (Signed)
Devin Clark J. Lucretia RoersWood, RN, BSN, [employer]CM [phone] Spoke with pt at bedside regarding [facility] planning [facility]. Offered pt list of home health agencies to choose from.  Pt chose [facility] to render services. Ayesha RumpfMary Yonjof, RN of [facility]  notified. Patient made aware that [facility]  will be in contact in 24-48 hours.  No DME needs identified at this time.

## 2017-05-23 NOTE — Progress Notes (Signed)
CSW spoke with patient and neighbor, "Peaches" via bedside. Neighbor voiced concerns with patient returning home due to recent falls and patient having "clutter" in home. Patient has Medicare and no recent 3 night stay therefore is unable to discharge to SNF unless private paying. CSW provided patient and neighbor with information for assisted living facilities in the area if patient would prefer long-term care in the future and provided neighbor with information for private duty sitters- if needed. CSW contacted RN CM for additional assistance.  Stacy GardnerErin Rayfield Beem, [facility]calaCSWA Emergency Room Clinical Social Worker [phone]

## 2017-05-23 NOTE — Discharge Instructions (Signed)
[facility] will be in contact in 24-48 hours. Return to the ED if any concerning signs or symptoms develop.

## 2018-02-06 ENCOUNTER — Encounter (HOSPITAL_COMMUNITY): Payer: Self-pay

## 2018-02-06 ENCOUNTER — Emergency Department (HOSPITAL_COMMUNITY): Payer: Medicare Other

## 2018-02-06 ENCOUNTER — Inpatient Hospital Stay (HOSPITAL_COMMUNITY)
Admission: EM | Admit: 2018-02-06 | Discharge: 2018-02-17 | DRG: 689 | Disposition: A | Discharge status: Skilled Nursing Facility | Payer: Medicare Other | Attending: Internal Medicine | Admitting: Internal Medicine

## 2018-02-06 DIAGNOSIS — Z9842 Cataract extraction status, left eye: Secondary | ICD-10-CM

## 2018-02-06 DIAGNOSIS — Y92009 Unspecified place in unspecified non-institutional (private) residence as the place of occurrence of the external cause: Secondary | ICD-10-CM

## 2018-02-06 DIAGNOSIS — N390 Urinary tract infection, site not specified: Secondary | ICD-10-CM | POA: Diagnosis not present

## 2018-02-06 DIAGNOSIS — E46 Unspecified protein-calorie malnutrition: Secondary | ICD-10-CM | POA: Diagnosis present

## 2018-02-06 DIAGNOSIS — N189 Chronic kidney disease, unspecified: Secondary | ICD-10-CM | POA: Diagnosis present

## 2018-02-06 DIAGNOSIS — R296 Repeated falls: Secondary | ICD-10-CM | POA: Diagnosis present

## 2018-02-06 DIAGNOSIS — H1132 Conjunctival hemorrhage, left eye: Secondary | ICD-10-CM

## 2018-02-06 DIAGNOSIS — E861 Hypovolemia: Secondary | ICD-10-CM | POA: Diagnosis present

## 2018-02-06 DIAGNOSIS — I482 Chronic atrial fibrillation: Secondary | ICD-10-CM | POA: Diagnosis present

## 2018-02-06 DIAGNOSIS — F0280 Dementia in other diseases classified elsewhere without behavioral disturbance: Secondary | ICD-10-CM

## 2018-02-06 DIAGNOSIS — I10 Essential (primary) hypertension: Secondary | ICD-10-CM | POA: Diagnosis present

## 2018-02-06 DIAGNOSIS — Z66 Do not resuscitate: Secondary | ICD-10-CM | POA: Diagnosis not present

## 2018-02-06 DIAGNOSIS — M6282 Rhabdomyolysis: Secondary | ICD-10-CM | POA: Diagnosis present

## 2018-02-06 DIAGNOSIS — Z9841 Cataract extraction status, right eye: Secondary | ICD-10-CM

## 2018-02-06 DIAGNOSIS — G301 Alzheimer's disease with late onset: Secondary | ICD-10-CM

## 2018-02-06 DIAGNOSIS — E871 Hypo-osmolality and hyponatremia: Secondary | ICD-10-CM | POA: Diagnosis present

## 2018-02-06 DIAGNOSIS — R627 Adult failure to thrive: Secondary | ICD-10-CM | POA: Diagnosis not present

## 2018-02-06 DIAGNOSIS — E785 Hyperlipidemia, unspecified: Secondary | ICD-10-CM | POA: Diagnosis present

## 2018-02-06 DIAGNOSIS — I495 Sick sinus syndrome: Secondary | ICD-10-CM | POA: Diagnosis present

## 2018-02-06 DIAGNOSIS — E039 Hypothyroidism, unspecified: Secondary | ICD-10-CM | POA: Diagnosis present

## 2018-02-06 DIAGNOSIS — I129 Hypertensive chronic kidney disease with stage 1 through stage 4 chronic kidney disease, or unspecified chronic kidney disease: Secondary | ICD-10-CM | POA: Diagnosis present

## 2018-02-06 DIAGNOSIS — E860 Dehydration: Secondary | ICD-10-CM | POA: Diagnosis present

## 2018-02-06 DIAGNOSIS — Z781 Physical restraint status: Secondary | ICD-10-CM

## 2018-02-06 DIAGNOSIS — Z950 Presence of cardiac pacemaker: Secondary | ICD-10-CM | POA: Diagnosis present

## 2018-02-06 DIAGNOSIS — Z681 Body mass index (BMI) 19 or less, adult: Secondary | ICD-10-CM

## 2018-02-06 DIAGNOSIS — Z9181 History of falling: Secondary | ICD-10-CM

## 2018-02-06 DIAGNOSIS — B952 Enterococcus as the cause of diseases classified elsewhere: Secondary | ICD-10-CM

## 2018-02-06 DIAGNOSIS — E870 Hyperosmolality and hypernatremia: Secondary | ICD-10-CM | POA: Diagnosis present

## 2018-02-06 DIAGNOSIS — D509 Iron deficiency anemia, unspecified: Secondary | ICD-10-CM | POA: Diagnosis present

## 2018-02-06 DIAGNOSIS — R64 Cachexia: Secondary | ICD-10-CM | POA: Diagnosis present

## 2018-02-06 DIAGNOSIS — I4891 Unspecified atrial fibrillation: Secondary | ICD-10-CM | POA: Diagnosis present

## 2018-02-06 DIAGNOSIS — Z8249 Family history of ischemic heart disease and other diseases of the circulatory system: Secondary | ICD-10-CM

## 2018-02-06 DIAGNOSIS — Z87891 Personal history of nicotine dependence: Secondary | ICD-10-CM

## 2018-02-06 DIAGNOSIS — Z79890 Hormone replacement therapy (postmenopausal): Secondary | ICD-10-CM

## 2018-02-06 DIAGNOSIS — Z955 Presence of coronary angioplasty implant and graft: Secondary | ICD-10-CM

## 2018-02-06 DIAGNOSIS — I2510 Atherosclerotic heart disease of native coronary artery without angina pectoris: Secondary | ICD-10-CM | POA: Diagnosis present

## 2018-02-06 DIAGNOSIS — F0281 Dementia in other diseases classified elsewhere with behavioral disturbance: Secondary | ICD-10-CM | POA: Diagnosis present

## 2018-02-06 DIAGNOSIS — J690 Pneumonitis due to inhalation of food and vomit: Secondary | ICD-10-CM

## 2018-02-06 DIAGNOSIS — W19XXXA Unspecified fall, initial encounter: Secondary | ICD-10-CM

## 2018-02-06 DIAGNOSIS — G309 Alzheimer's disease, unspecified: Secondary | ICD-10-CM | POA: Diagnosis present

## 2018-02-06 MED ORDER — SODIUM CHLORIDE 0.9 % IV SOLN
Freq: Once | INTRAVENOUS | Status: AC
Start: 2018-02-06 — End: 2018-02-07
  Administered 2018-02-07: via INTRAVENOUS

## 2018-02-06 NOTE — ED Provider Notes (Addendum)
[facility]-EMERGENCY DEPT Provider Note: Lowella Dell, MD, FACEP  CSN: [id number] MRN: [id number] ARRIVAL: 02/06/18 at 2132 ROOM: WA09/WA09   CHIEF COMPLAINT  Fall  Level 5 caveat: Dementia HISTORY OF PRESENT ILLNESS  02/06/18 11:18 PM Devin Clark is a 84 y.o. male with dementia who lives alone and his assisted with his activities of daily living by his neighbors.  He was last seen normal at about 3 PM.  This evening they returned and found him on the floor next to the couch.  There was no sign of injury and he has not acted like he is in pain.  His speech is incomprehensible and he cannot give a history.   Past Medical History:  Diagnosis Date  . Atrial fibrillation (HCC)   . CAD (coronary artery disease)   . Compression fracture 2015  . Dyslipidemia   . Hypertension   . Hypothyroidism   . Sinus node dysfunction (HCC)    St.Jude pacemaker 06/05/2011  . Stented coronary artery 1998    Past Surgical History:  Procedure Laterality Date  . CARDIAC CATHETERIZATION  03/07/2000   100% CX,widely patent stent in RCA  . CARDIAC CATHETERIZATION  03/26/2007   100% CX,widely patnet stent in RCA  . CATARACT EXTRACTION, BILATERAL  2011  . CHEST TUBE INSERTION  06/06/11   right iatrogenic pneumothorax  . CORONARY ANGIOPLASTY WITH STENT PLACEMENT  12/30/1996   stent RCA  . NM MYOVIEW LTD  03/04/2007   High risk,mild-mod anterolateral/inferolateral ischemia  . PACEMAKER INSERTION  06/05/11   St.Jude Accent DR  . patch perforated duodenal ulcer  05/28/11   Cheree Ditto patch    Family History  Problem Relation Age of Onset  . Heart failure Mother   . Heart failure Father   . Heart failure Sister     Social History   Tobacco Use  . Smoking status: Former Smoker    Years: 1.00    Types: Cigarettes  . Smokeless tobacco: Never Used  Substance Use Topics  . Alcohol use: No  . Drug use: No    Prior to Admission medications   Medication Sig Start Date End Date Taking? Authorizing  Provider  Aspirin-Caffeine (BC FAST PAIN RELIEF) 845-65 MG PACK Take 1 each by mouth as needed (pain).   Yes [provider]  ibuprofen (ADVIL,MOTRIN) 200 MG tablet Take 600 mg by mouth every 6 (six) hours as needed for moderate pain.   Yes [provider]  acetaminophen (TYLENOL) 500 MG tablet Take 2 tablets (1,000 mg total) by mouth every 6 (six) hours as needed. Patient not taking: Reported on 02/06/2018 10/03/16   Palma Holter, MD  fish oil-omega-3 fatty acids 1000 MG capsule Take 2 g by mouth daily.    [provider]  levothyroxine (SYNTHROID, LEVOTHROID) 100 MCG tablet Take 100 mcg by mouth daily before breakfast.    [provider]  Multiple Vitamin (MULTIVITAMIN WITH MINERALS) TABS Take 1 tablet by mouth daily.    [provider]  oxyCODONE (OXY IR/ROXICODONE) 5 MG immediate release tablet Take 0.5 tablets (2.5 mg total) by mouth every 6 (six) hours as needed for severe pain. Patient not taking: Reported on 05/22/2017 10/03/16   Palma Holter, MD  simvastatin (ZOCOR) 80 MG tablet Take 1 tablet (80 mg total) by mouth at bedtime. Patient not taking: Reported on 02/19/2016 11/09/13   Croitoru, Mihai, MD  vitamin B-12 (CYANOCOBALAMIN) 1000 MCG tablet Take 1,000 mcg by mouth daily.    [provider]  vitamin C (ASCORBIC ACID) 500 MG tablet Take 500 mg by mouth daily.    [provider]  vitamin E 400 UNIT capsule Take 400 Units by mouth daily.    [provider]    Allergies Patient has no known allergies.   REVIEW OF SYSTEMS     PHYSICAL EXAMINATION  Initial Vital Signs Blood pressure (!) 145/77, pulse (!) 57, resp. rate 14, SpO2 100 %.  Examination General: Well-developed, cachectic male in no acute distress; appearance consistent with age of record; poor personal hygiene:      HENT: normocephalic; no hematomas or ecchymoses palpated or seen  Eyes: pupils equal, round and reactive to light;  left medial subconjunctival hemorrhage; bilateral pseudophakia Neck: supple; no signs of pain on palpation or movement Heart: regular rate and rhythm Lungs: clear to auscultation bilaterally Abdomen: soft; scaphoid; nontender; no masses or hepatosplenomegaly; bowel sounds present Extremities: Arthritic changes; no acute deformity; pulses normal Neurologic: Sleeping but readily awakens; incomprehensible speech but is able to state his name; motor function intact in all extremities and symmetric; no facial droop Skin: Warm and dry   RESULTS  Summary of this visit's results, reviewed by myself:   EKG Interpretation  Date/Time:  Friday Feb 07 2018 01:55:57 EDT Ventricular Rate:  61 PR Interval:    QRS Duration: 166 QT Interval:  478 QTC Calculation: 482 R Axis:   -62 Text Interpretation:  Ventricular-paced rhythm No further analysis attempted due to paced rhythm Baseline wander in lead(s) V4 No significant change was found Confirmed by Harmoney Sienkiewicz, Jonny Ruiz ([id number]) on 02/07/2018 2:01:13 AM      Laboratory Studies: Results for orders placed or performed during the hospital encounter of 02/06/18 (from the past 24 hour(s))  CBC with Differential/Platelet     Status: Abnormal   Collection Time: 02/06/18 11:33 PM  Result Value Ref Range   WBC 11.6 (H) 4.0 - 10.5 K/uL   RBC 4.09 (L) 4.22 - 5.81 MIL/uL   Hemoglobin 12.5 (L) 13.0 - 17.0 g/dL   HCT 11.9 (L) 14.7 - 82.9 %   MCV 95.1 78.0 - 100.0 fL   MCH 30.6 26.0 - 34.0 pg   MCHC 32.1 30.0 - 36.0 g/dL   RDW 56.2 13.0 - 86.5 %   Platelets 346 150 - 400 K/uL   Neutrophils Relative % 92 %   Neutro Abs 10.7 (H) 1.7 - 7.7 K/uL   Lymphocytes Relative 4 %   Lymphs Abs 0.4 (L) 0.7 - 4.0 K/uL   Monocytes Relative 4 %   Monocytes Absolute 0.5 0.1 - 1.0 K/uL   Eosinophils Relative 0 %   Eosinophils Absolute 0.0 0.0 - 0.7 K/uL   Basophils Relative 0 %   Basophils Absolute 0.0 0.0 - 0.1 K/uL  Comprehensive metabolic panel     Status: Abnormal    Collection Time: 02/06/18 11:33 PM  Result Value Ref Range   Sodium 146 (H) 135 - 145 mmol/L   Potassium 3.9 3.5 - 5.1 mmol/L   Chloride 112 (H) 101 - 111 mmol/L   CO2 27 22 - 32 mmol/L   Glucose, Bld 136 (H) 65 - 99 mg/dL   BUN 25 (H) 6 - 20 mg/dL   Creatinine, Ser 7.84 0.61 - 1.24 mg/dL   Calcium 8.8 (L) 8.9 - 10.3 mg/dL   Total Protein 6.5 6.5 - 8.1 g/dL   Albumin 3.2 (L) 3.5 - 5.0 g/dL   AST 30 15 - 41 U/L   ALT 16 (L) 17 -  63 U/L   Alkaline Phosphatase 79 38 - 126 U/L   Total Bilirubin 0.9 0.3 - 1.2 mg/dL   GFR calc non Af Amer >60 >60 mL/min   GFR calc Af Amer >60 >60 mL/min   Anion gap 7 5 - 15  Urinalysis, Routine w reflex microscopic     Status: Abnormal   Collection Time: 02/07/18 12:57 AM  Result Value Ref Range   Color, Urine YELLOW YELLOW   APPearance CLEAR CLEAR   Specific Gravity, Urine 1.020 1.005 - 1.030   pH 5.0 5.0 - 8.0   Glucose, UA NEGATIVE NEGATIVE mg/dL   Hgb urine dipstick MODERATE (A) NEGATIVE   Bilirubin Urine NEGATIVE NEGATIVE   Ketones, ur 5 (A) NEGATIVE mg/dL   Protein, ur 30 (A) NEGATIVE mg/dL   Nitrite NEGATIVE NEGATIVE   Leukocytes, UA TRACE (A) NEGATIVE   RBC / HPF 21-50 0 - 5 RBC/hpf   WBC, UA 6-10 0 - 5 WBC/hpf   Bacteria, UA MANY (A) NONE SEEN   Mucus PRESENT    Imaging Studies: Ct Head Wo Contrast  Result Date: 02/07/2018 CLINICAL DATA:  84 y/o  M; found on floor. EXAM: CT HEAD WITHOUT CONTRAST TECHNIQUE: Contiguous axial images were obtained from the base of the skull through the vertex without intravenous contrast. COMPARISON:  05/22/2017 CT head. FINDINGS: Brain: No evidence of acute infarction, hemorrhage, hydrocephalus, extra-axial collection or mass lesion/mass effect. Stable chronic microvascular ischemic changes and parenchymal volume loss of the brain. Vascular: Calcific atherosclerosis of the carotid siphons. No hyperdense vessel identified. Skull: Normal. Negative for fracture or focal lesion. Sinuses/Orbits: Mild paranasal  sinus mucosal thickening. Small right maxillary sinus fluid level. Normal aeration of mastoid air cells. Bilateral intra-ocular lens replacement. Other: None. IMPRESSION: 1. No acute intracranial abnormality identified. 2. Stable chronic microvascular ischemic changes and parenchymal volume loss of the brain. 3. Paranasal sinus disease with a right maxillary fluid level which may represent acute sinusitis. Electronically Signed   By: Mitzi Hansen M.D.   On: 02/07/2018 00:43    ED COURSE and MDM  Nursing notes and initial vitals signs, including pulse oximetry, reviewed.  Vitals:   02/07/18 0019  BP: (!) 145/77  Pulse: (!) 57  Resp: 14  SpO2: 100%   1:15 AM Rocephin 1 g ordered for possible urinary tract infection.  Patient clearly is unable to care for himself at home without family or in-house care.  It is unclear just how dependent he is on his neighbors.  I suspect he will need placement on discharge.  2:03 AM Dr. Toniann Fail to admit.   PROCEDURES    ED DIAGNOSES     ICD-10-CM   1. Failure to thrive in adult R62.7   2. Late onset Alzheimer's disease without behavioral disturbance G30.1    F02.80   3. Fall in home, initial encounter W19.XXXA    Y92.009   4. Subconjunctival hemorrhage of left eye H11.32        Paula Libra, MD 02/07/18 1610    Paula Libra, MD 02/07/18 [id number]

## 2018-02-06 NOTE — ED Triage Notes (Signed)
Pt from home and lives alone Pt's neighbors look after him and were there the last time about 3pm, they say that this is normal for him Neighbors called EMS when they found him tonight on the floor next to the couch Pt has a pacemaker CBG 131 160/88 Pt doesn't complain of pain or react to pain

## 2018-02-07 ENCOUNTER — Observation Stay (HOSPITAL_COMMUNITY): Payer: Medicare Other

## 2018-02-07 ENCOUNTER — Other Ambulatory Visit: Payer: Self-pay

## 2018-02-07 ENCOUNTER — Encounter (HOSPITAL_COMMUNITY): Payer: Self-pay | Admitting: Internal Medicine

## 2018-02-07 DIAGNOSIS — G308 Other Alzheimer's disease: Secondary | ICD-10-CM | POA: Diagnosis not present

## 2018-02-07 DIAGNOSIS — W19XXXA Unspecified fall, initial encounter: Secondary | ICD-10-CM | POA: Insufficient documentation

## 2018-02-07 DIAGNOSIS — Y92009 Unspecified place in unspecified non-institutional (private) residence as the place of occurrence of the external cause: Secondary | ICD-10-CM

## 2018-02-07 DIAGNOSIS — F0280 Dementia in other diseases classified elsewhere without behavioral disturbance: Secondary | ICD-10-CM

## 2018-02-07 DIAGNOSIS — I4891 Unspecified atrial fibrillation: Secondary | ICD-10-CM

## 2018-02-07 LAB — CBC WITH DIFFERENTIAL/PLATELET
BASOS ABS: 0 10*3/uL (ref 0.0–0.1)
Basophils Relative: 0 %
Eosinophils Absolute: 0 10*3/uL (ref 0.0–0.7)
Eosinophils Relative: 0 %
HEMATOCRIT: 38.9 % — AB (ref 39.0–52.0)
Hemoglobin: 12.5 g/dL — ABNORMAL LOW (ref 13.0–17.0)
LYMPHS ABS: 0.4 10*3/uL — AB (ref 0.7–4.0)
LYMPHS PCT: 4 %
MCH: 30.6 pg (ref 26.0–34.0)
MCHC: 32.1 g/dL (ref 30.0–36.0)
MCV: 95.1 fL (ref 78.0–100.0)
Monocytes Absolute: 0.5 10*3/uL (ref 0.1–1.0)
Monocytes Relative: 4 %
NEUTROS ABS: 10.7 10*3/uL — AB (ref 1.7–7.7)
Neutrophils Relative %: 92 %
Platelets: 346 10*3/uL (ref 150–400)
RBC: 4.09 MIL/uL — AB (ref 4.22–5.81)
RDW: 14.4 % (ref 11.5–15.5)
WBC: 11.6 10*3/uL — AB (ref 4.0–10.5)

## 2018-02-07 LAB — IRON AND TIBC
Iron: 33 ug/dL — ABNORMAL LOW (ref 45–182)
Saturation Ratios: 15 % — ABNORMAL LOW (ref 17.9–39.5)
TIBC: 214 ug/dL — ABNORMAL LOW (ref 250–450)
UIBC: 181 ug/dL

## 2018-02-07 LAB — URINALYSIS, ROUTINE W REFLEX MICROSCOPIC
Bilirubin Urine: NEGATIVE
GLUCOSE, UA: NEGATIVE mg/dL
Ketones, ur: 5 mg/dL — AB
Nitrite: NEGATIVE
PROTEIN: 30 mg/dL — AB
SPECIFIC GRAVITY, URINE: 1.02 (ref 1.005–1.030)
pH: 5 (ref 5.0–8.0)

## 2018-02-07 LAB — COMPREHENSIVE METABOLIC PANEL
ALT: 16 U/L — AB (ref 17–63)
AST: 30 U/L (ref 15–41)
Albumin: 3.2 g/dL — ABNORMAL LOW (ref 3.5–5.0)
Alkaline Phosphatase: 79 U/L (ref 38–126)
Anion gap: 7 (ref 5–15)
BUN: 25 mg/dL — AB (ref 6–20)
CHLORIDE: 112 mmol/L — AB (ref 101–111)
CO2: 27 mmol/L (ref 22–32)
Calcium: 8.8 mg/dL — ABNORMAL LOW (ref 8.9–10.3)
Creatinine, Ser: 0.88 mg/dL (ref 0.61–1.24)
Glucose, Bld: 136 mg/dL — ABNORMAL HIGH (ref 65–99)
Potassium: 3.9 mmol/L (ref 3.5–5.1)
Sodium: 146 mmol/L — ABNORMAL HIGH (ref 135–145)
TOTAL PROTEIN: 6.5 g/dL (ref 6.5–8.1)
Total Bilirubin: 0.9 mg/dL (ref 0.3–1.2)

## 2018-02-07 LAB — CBC
HCT: 40.2 % (ref 39.0–52.0)
HEMOGLOBIN: 12.6 g/dL — AB (ref 13.0–17.0)
MCH: 30 pg (ref 26.0–34.0)
MCHC: 31.3 g/dL (ref 30.0–36.0)
MCV: 95.7 fL (ref 78.0–100.0)
Platelets: 382 10*3/uL (ref 150–400)
RBC: 4.2 MIL/uL — AB (ref 4.22–5.81)
RDW: 14.5 % (ref 11.5–15.5)
WBC: 12.7 10*3/uL — ABNORMAL HIGH (ref 4.0–10.5)

## 2018-02-07 LAB — VITAMIN B12: VITAMIN B 12: 639 pg/mL (ref 180–914)

## 2018-02-07 LAB — AMMONIA: Ammonia: 18 umol/L (ref 9–35)

## 2018-02-07 LAB — BASIC METABOLIC PANEL
ANION GAP: 9 (ref 5–15)
BUN: 24 mg/dL — ABNORMAL HIGH (ref 6–20)
CHLORIDE: 114 mmol/L — AB (ref 101–111)
CO2: 25 mmol/L (ref 22–32)
Calcium: 8.8 mg/dL — ABNORMAL LOW (ref 8.9–10.3)
Creatinine, Ser: 0.9 mg/dL (ref 0.61–1.24)
GFR calc non Af Amer: >60 mL/min (ref >60)
Glucose, Bld: 127 mg/dL — ABNORMAL HIGH (ref 65–99)
Potassium: 3.7 mmol/L (ref 3.5–5.1)
Sodium: 148 mmol/L — ABNORMAL HIGH (ref 135–145)

## 2018-02-07 LAB — TROPONIN I
TROPONIN I: 0.07 ng/mL — AB (ref <0.03)
Troponin I: 0.07 ng/mL (ref <0.03)
Troponin I: 0.1 ng/mL (ref <0.03)

## 2018-02-07 LAB — RETICULOCYTES
RBC.: 4.2 MIL/uL — AB (ref 4.22–5.81)
RETIC COUNT ABSOLUTE: 42 10*3/uL (ref 19.0–186.0)
Retic Ct Pct: 1 % (ref 0.4–3.1)

## 2018-02-07 LAB — FOLATE: FOLATE: 11.5 ng/mL (ref >5.9)

## 2018-02-07 LAB — T4, FREE: FREE T4: 0.74 ng/dL — AB (ref 0.82–1.77)

## 2018-02-07 LAB — FERRITIN: Ferritin: 387 ng/mL — ABNORMAL HIGH (ref 24–336)

## 2018-02-07 LAB — CK: Total CK: 614 U/L — ABNORMAL HIGH (ref 49–397)

## 2018-02-07 LAB — MAGNESIUM: Magnesium: 2.1 mg/dL (ref 1.7–2.4)

## 2018-02-07 LAB — TSH: TSH: 7.208 u[IU]/mL — ABNORMAL HIGH (ref 0.350–4.500)

## 2018-02-07 MED ORDER — HALOPERIDOL LACTATE 5 MG/ML IJ SOLN
2.5000 mg | Freq: Once | INTRAMUSCULAR | Status: AC
Start: 2018-02-07 — End: 2018-02-07
  Administered 2018-02-07: 2.5 mg via INTRAVENOUS
  Filled 2018-02-07: qty 1

## 2018-02-07 MED ORDER — ENSURE ENLIVE PO LIQD
237.0000 mL | Freq: Three times a day (TID) | ORAL | Status: DC
  Administered 2018-02-07 – 2018-02-11 (×12): 237 mL via ORAL

## 2018-02-07 MED ORDER — LORAZEPAM 2 MG/ML IJ SOLN: 0.5000 mg | Freq: Once | INTRAMUSCULAR | Status: DC

## 2018-02-07 MED ORDER — SODIUM CHLORIDE 0.9 % IV SOLN
1.0000 g | Freq: Every day | INTRAVENOUS | Status: DC
  Administered 2018-02-07 – 2018-02-09 (×3): 1 g via INTRAVENOUS
  Filled 2018-02-07 (×3): qty 1

## 2018-02-07 MED ORDER — LEVOTHYROXINE SODIUM 100 MCG PO TABS
100.0000 ug | ORAL_TABLET | Freq: Every day | ORAL | Status: DC
  Administered 2018-02-08 – 2018-02-11 (×4): 100 ug via ORAL
  Filled 2018-02-07 (×5): qty 1

## 2018-02-07 MED ORDER — FLUTICASONE PROPIONATE 50 MCG/ACT NA SUSP
1.0000 | Freq: Every day | NASAL | Status: DC
  Administered 2018-02-08 – 2018-02-15 (×5): 1 via NASAL
  Filled 2018-02-07: qty 16

## 2018-02-07 MED ORDER — SODIUM CHLORIDE 0.9 % IV SOLN
1.0000 g | Freq: Once | INTRAVENOUS | Status: AC
  Administered 2018-02-07: 1 g via INTRAVENOUS
  Filled 2018-02-07: qty 10

## 2018-02-07 MED ORDER — SODIUM CHLORIDE 0.9 % IV SOLN
INTRAVENOUS | Status: DC
  Administered 2018-02-07: 04:00:00 via INTRAVENOUS

## 2018-02-07 MED ORDER — ENOXAPARIN SODIUM 40 MG/0.4ML ~~LOC~~ SOLN: 40.0000 mg | Freq: Every day | SUBCUTANEOUS | Status: DC

## 2018-02-07 MED ORDER — ONDANSETRON HCL 4 MG/2ML IJ SOLN: 4.0000 mg | Freq: Four times a day (QID) | INTRAMUSCULAR | Status: DC | PRN

## 2018-02-07 MED ORDER — ONDANSETRON HCL 4 MG PO TABS: 4.0000 mg | ORAL_TABLET | Freq: Four times a day (QID) | ORAL | Status: DC | PRN

## 2018-02-07 MED ORDER — VITAMIN B-12 1000 MCG PO TABS
1000.0000 ug | ORAL_TABLET | Freq: Every day | ORAL | Status: DC
  Administered 2018-02-08 – 2018-02-11 (×4): 1000 ug via ORAL
  Filled 2018-02-07 (×5): qty 1

## 2018-02-07 MED ORDER — ENOXAPARIN SODIUM 30 MG/0.3ML ~~LOC~~ SOLN
30.0000 mg | Freq: Every day | SUBCUTANEOUS | Status: DC
  Administered 2018-02-07 – 2018-02-16 (×10): 30 mg via SUBCUTANEOUS
  Filled 2018-02-07 (×11): qty 0.3

## 2018-02-07 MED ORDER — SODIUM CHLORIDE 0.9 % IV SOLN
1.0000 g | Freq: Every day | INTRAVENOUS | Status: DC
  Filled 2018-02-07: qty 10

## 2018-02-07 MED ORDER — OMEGA-3-ACID ETHYL ESTERS 1 G PO CAPS
1.0000 g | ORAL_CAPSULE | Freq: Every day | ORAL | Status: DC
  Administered 2018-02-08 – 2018-02-11 (×4): 1 g via ORAL
  Filled 2018-02-07 (×5): qty 1

## 2018-02-07 MED ORDER — ACETAMINOPHEN 650 MG RE SUPP: 650.0000 mg | Freq: Four times a day (QID) | RECTAL | Status: DC | PRN

## 2018-02-07 MED ORDER — DEXTROSE 5 % IV SOLN
INTRAVENOUS | Status: AC
  Administered 2018-02-07: 09:00:00 via INTRAVENOUS

## 2018-02-07 MED ORDER — AZITHROMYCIN 250 MG PO TABS
500.0000 mg | ORAL_TABLET | Freq: Every day | ORAL | Status: DC
  Administered 2018-02-07 – 2018-02-11 (×5): 500 mg via ORAL
  Filled 2018-02-07 (×6): qty 2

## 2018-02-07 MED ORDER — ADULT MULTIVITAMIN W/MINERALS CH
1.0000 | ORAL_TABLET | Freq: Every day | ORAL | Status: DC
  Administered 2018-02-08 – 2018-02-11 (×4): 1 via ORAL
  Filled 2018-02-07 (×5): qty 1

## 2018-02-07 MED ORDER — ACETAMINOPHEN 325 MG PO TABS
650.0000 mg | ORAL_TABLET | Freq: Four times a day (QID) | ORAL | Status: DC | PRN
  Administered 2018-02-09: 650 mg via ORAL
  Filled 2018-02-07: qty 2

## 2018-02-07 MED ORDER — HALOPERIDOL LACTATE 5 MG/ML IJ SOLN
2.0000 mg | Freq: Once | INTRAMUSCULAR | Status: DC
Start: 2018-02-07 — End: 2018-02-07
  Administered 2018-02-07: 2 mg via INTRAVENOUS
  Filled 2018-02-07: qty 1

## 2018-02-07 MED ORDER — VITAMIN C 500 MG PO TABS
500.0000 mg | ORAL_TABLET | Freq: Every day | ORAL | Status: DC
  Administered 2018-02-08 – 2018-02-11 (×4): 500 mg via ORAL
  Filled 2018-02-07 (×5): qty 1

## 2018-02-07 MED ORDER — LACTATED RINGERS IV SOLN
INTRAVENOUS | Status: DC
Start: 2018-02-07 — End: 2018-02-07
  Administered 2018-02-07: 05:00:00 via INTRAVENOUS

## 2018-02-07 MED ORDER — DEXTROSE-NACL 5-0.45 % IV SOLN
INTRAVENOUS | Status: DC
  Administered 2018-02-07 – 2018-02-08 (×2): via INTRAVENOUS

## 2018-02-07 NOTE — Progress Notes (Addendum)
PROGRESS NOTE  BRODEE MAURITZ ZOX:[id number] DOB: 12-Jun-1921 DOA: 02/06/2018 PCP: Patient, No Pcp Per  HPI/Recap of past 12 hours: 84 year old male with history of dementia, CAD, A. fib, sinus node dysfunction status post pacemaker placement, hypothyroidism was brought to the ED after neighbor found patient on the floor at home, unsure how long patient was down.  Neighbor usually takes care of the patient.  Patient has had multiple falls per chart review.  In the ER CT head and x-ray pelvis negative for any acute fracture.  UA concerning for UTI.  Sodium noted to be around 147.  Patient admitted for further management and possible nursing home placement.  Overnight patient was noted to be agitated, requiring restraints and sedation.  This a.m. patient sleeping comfortably, did not wake patient as patient's chest fell asleep after being agitated throughout the night.  RN reported no other issues.  Assessment/Plan: Principal Problem:   Fall Active Problems:   Hypertension   Cardiac pacemaker   Coronary artery disease   Alzheimer's dementia   Atrial fibrillation (HCC)  History of multiple falls Likely due to dehydration/possible UTI/dementia CK level 614, will trend CT head and x-ray pelvis negative for acute changes/fractures Continue IV hydration PT consulted Social worker consulted for possible NH placement Fall precautions  UTI Afebrile with leukocytosis UA with trace leukocytes, many bacteria, mucus present, WBC 6-10 UC pending Continue IV ceftriaxone  ??CAP Afebrile with leukocytosis CXR shows left basilar opacity Urine strep pneumo and Legionella pending Continue IV ceftriaxone, will add azithromycin  Hypernatremia Likely due to dehydration Continue IV fluids  Daily BMP  Elevated troponin/history of CAD Chest pain-free Flat trend, EKG showed paced rhythm No further intervention Held simvastatin 80 mg daily  Chronic A. Fib/sinus node dysfunction Status post  pacemaker Currently rate controlled Not on any anticoagulation due to fall risk  Hypothyroidism TSH 7.2, free T4 0.74 Given advanced age, low BMI, will keep Synthroid at the same dose Continue Synthroid  Normocytic anemia/iron deficiency Hemoglobin at baseline Anemia panel showed iron 33, saturation 15, ferritin 387, folate 11.5, B12 639 Daily CBC  Dementia with behavioral disturbances Patient agitated, had to be restrained Monitor closely  Malnutrition Dietitian consulted     Code Status: Full  Family Communication: None at bedside  Disposition Plan: Likely SNF   Consultants:  None  Procedures:  None  Antimicrobials:  IV Rocephin  DVT prophylaxis: Lovenox   Objective: Vitals:   02/07/18 0450 02/07/18 0453 02/07/18 1303 02/07/18 1303  BP:  (!) 115/99  125/66  Pulse:  (!) 57  65  Resp:  20  14  Temp:  (!) 97.3 F (36.3 C) 97.9 F (36.6 C)   TempSrc:  Oral Oral   SpO2:  98%  97%  Weight: 46.7 kg (102 lb 15.3 oz)     Height: 5\' 5"  (1.651 m)       Intake/Output Summary (Last 24 hours) at 02/07/2018 1824 Last data filed at 02/07/2018 1500 Gross per 24 hour  Intake 581.25 ml  Output 200 ml  Net 381.25 ml   Filed Weights   02/07/18 0450  Weight: 46.7 kg (102 lb 15.3 oz)    Exam:   General: Sleeping  Cardiovascular: S1, S2 present  Respiratory: CTAB  Abdomen: Soft, nontender, nondistended, bowel sounds present  Musculoskeletal: No pedal edema bilaterally  Skin: Normal  Psychiatry: Agitated, but currently sleeping   Data Reviewed: CBC: Recent Labs  Lab 02/06/18 2333 02/07/18 0535  WBC 11.6* 12.7*  NEUTROABS 10.7*  --  HGB 12.5* 12.6*  HCT 38.9* 40.2  MCV 95.1 95.7  PLT 346 382   Basic Metabolic Panel: Recent Labs  Lab 02/06/18 2333 02/07/18 0535  NA 146* 148*  K 3.9 3.7  CL 112* 114*  CO2 27 25  GLUCOSE 136* 127*  BUN 25* 24*  CREATININE 0.88 0.90  CALCIUM 8.8* 8.8*  MG  --  2.1   GFR: Estimated Creatinine  Clearance: 33.2 mL/min (by C-G formula based on SCr of 0.9 mg/dL). Liver Function Tests: Recent Labs  Lab 02/06/18 2333  AST 30  ALT 16*  ALKPHOS 79  BILITOT 0.9  PROT 6.5  ALBUMIN 3.2*   No results for input(s): LIPASE, AMYLASE in the last 168 hours. Recent Labs  Lab 02/07/18 0535  AMMONIA 18   Coagulation Profile: No results for input(s): INR, PROTIME in the last 168 hours. Cardiac Enzymes: Recent Labs  Lab 02/07/18 0535 02/07/18 1152  CKTOTAL 614*  --   TROPONINI 0.07* 0.07*   BNP (last 3 results) No results for input(s): PROBNP in the last 8760 hours. HbA1C: No results for input(s): HGBA1C in the last 72 hours. CBG: No results for input(s): GLUCAP in the last 168 hours. Lipid Profile: No results for input(s): CHOL, HDL, LDLCALC, TRIG, CHOLHDL, LDLDIRECT in the last 72 hours. Thyroid Function Tests: Recent Labs    02/07/18 0535 02/07/18 0819  TSH 7.208*  --   FREET4  --  0.74*   Anemia Panel: Recent Labs    02/07/18 0535  VITAMINB12 639  FOLATE 11.5  FERRITIN 387*  TIBC 214*  IRON 33*  RETICCTPCT 1.0   Urine analysis:    Component Value Date/Time   COLORURINE YELLOW 02/07/2018 0057   APPEARANCEUR CLEAR 02/07/2018 0057   LABSPEC 1.020 02/07/2018 0057   PHURINE 5.0 02/07/2018 0057   GLUCOSEU NEGATIVE 02/07/2018 0057   HGBUR MODERATE (A) 02/07/2018 0057   BILIRUBINUR NEGATIVE 02/07/2018 0057   KETONESUR 5 (A) 02/07/2018 0057   PROTEINUR 30 (A) 02/07/2018 0057   UROBILINOGEN 1.0 06/27/2014 1514   NITRITE NEGATIVE 02/07/2018 0057   LEUKOCYTESUR TRACE (A) 02/07/2018 0057   Sepsis Labs: @LABRCNTIP (procalcitonin:4,lacticidven:4)  )No results found for this or any previous visit (from the past 240 hour(s)).    Studies: Ct Head Wo Contrast  Result Date: 02/07/2018 CLINICAL DATA:  84 y/o  M; found on floor. EXAM: CT HEAD WITHOUT CONTRAST TECHNIQUE: Contiguous axial images were obtained from the base of the skull through the vertex without  intravenous contrast. COMPARISON:  05/22/2017 CT head. FINDINGS: Brain: No evidence of acute infarction, hemorrhage, hydrocephalus, extra-axial collection or mass lesion/mass effect. Stable chronic microvascular ischemic changes and parenchymal volume loss of the brain. Vascular: Calcific atherosclerosis of the carotid siphons. No hyperdense vessel identified. Skull: Normal. Negative for fracture or focal lesion. Sinuses/Orbits: Mild paranasal sinus mucosal thickening. Small right maxillary sinus fluid level. Normal aeration of mastoid air cells. Bilateral intra-ocular lens replacement. Other: None. IMPRESSION: 1. No acute intracranial abnormality identified. 2. Stable chronic microvascular ischemic changes and parenchymal volume loss of the brain. 3. Paranasal sinus disease with a right maxillary fluid level which may represent acute sinusitis. Electronically Signed   By: Mitzi HansenLance  Furusawa-Stratton M.D.   On: 02/07/2018 00:43   Dg Pelvis Portable  Result Date: 02/07/2018 CLINICAL DATA:  84 y/o  M; found on floor status post fall. EXAM: PORTABLE PELVIS 1-2 VIEWS COMPARISON:  06/25/2014 pelvic radiograph. FINDINGS: There is no evidence of pelvic fracture or diastasis. No pelvic bone lesions are seen.  IMPRESSION: Negative. Electronically Signed   By: Mitzi Hansen M.D.   On: 02/07/2018 04:06   Dg Chest Port 1 View  Result Date: 02/07/2018 CLINICAL DATA:  Status post fall. Concern for chest injury. Initial encounter. EXAM: PORTABLE CHEST 1 VIEW COMPARISON:  Chest radiograph performed 10/03/2016 FINDINGS: The lungs are well-aerated. Left basilar airspace opacity raises concern for pneumonia. No pleural effusion or pneumothorax is seen. Prominence of the thoracic aorta may reflect unfolding of the thoracic aorta, though it appears new from prior studies. Aneurysmal dilatation cannot be excluded. The cardiomediastinal silhouette is otherwise normal in size. A right-sided pacemaker is noted, with leads  ending overlying the right atrium and right ventricle. There is chronic superior subluxation of the left humeral head. No acute osseous abnormalities are seen. IMPRESSION: 1. Left basilar airspace opacity raises concern for pneumonia. 2. Prominence of the thoracic aorta may reflect unfolding of the thoracic aorta, though it appears new from prior studies. Aneurysmal dilatation cannot be excluded. Contrast-enhanced CT of the chest would be helpful for further evaluation. Electronically Signed   By: Roanna Raider M.D.   On: 02/07/2018 06:29    Scheduled Meds: . enoxaparin (LOVENOX) injection  30 mg Subcutaneous Daily  . feeding supplement (ENSURE ENLIVE)  237 mL Oral TID BM  . levothyroxine  100 mcg Oral QAC breakfast  . LORazepam  0.5 mg Intravenous Once  . multivitamin with minerals  1 tablet Oral Daily  . omega-3 acid ethyl esters  1 g Oral Daily  . vitamin B-12  1,000 mcg Oral Daily  . vitamin C  500 mg Oral Daily    Continuous Infusions: . cefTRIAXone (ROCEPHIN)  IV    . dextrose 75 mL/hr at 02/07/18 0835     LOS: 0 days     Briant Cedar, MD [facility]  If 7PM-7AM, please contact night-coverage [email or web address] Password [facility] 02/07/2018, 6:24 PM

## 2018-02-07 NOTE — H&P (Addendum)
History and Physical    Devin Clark:[id number] DOB: Jul 29, 1921 DOA: 02/06/2018  PCP: Patient, No Pcp Per  Patient coming from: Home.  Chief Complaint: Fall.  HPI: Devin Clark is a 84 y.o. male with history of dementia, CAD status post stenting, atrial fibrillation, sinus node dysfunction status post pacemaker placement, hypothyroidism was brought to the ER after patient was found on the floor at home.  Patient is usually taken care of by neighbor.  Not sure how long patient was on the floor.  On review of patient's chart patient has had multiple falls previously.  No further history is available as patient is not very communicative and caregiver at this time is not available.  ED Course: In the ER patient had CT head which did not show anything acute x-ray pelvis was negative for anything acute.  UA shows features concerning for UTI and sodium is around 147.  Patient was aggressive in the ER and had to be given 1 dose of Haldol.  Patient probably will need permanent nursing home placement.  Review of Systems: As per HPI, rest all negative.   Past Medical History:  Diagnosis Date  . Atrial fibrillation (HCC)   . CAD (coronary artery disease)   . Compression fracture 2015  . Dyslipidemia   . Hypertension   . Hypothyroidism   . Sinus node dysfunction (HCC)    St.Jude pacemaker 06/05/2011  . Stented coronary artery 1998    Past Surgical History:  Procedure Laterality Date  . CARDIAC CATHETERIZATION  03/07/2000   100% CX,widely patent stent in RCA  . CARDIAC CATHETERIZATION  03/26/2007   100% CX,widely patnet stent in RCA  . CATARACT EXTRACTION, BILATERAL  2011  . CHEST TUBE INSERTION  06/06/11   right iatrogenic pneumothorax  . CORONARY ANGIOPLASTY WITH STENT PLACEMENT  12/30/1996   stent RCA  . NM MYOVIEW LTD  03/04/2007   High risk,mild-mod anterolateral/inferolateral ischemia  . PACEMAKER INSERTION  06/05/11   St.Jude Accent DR  . patch perforated duodenal ulcer   05/28/11   Cheree Ditto patch     reports that he has quit smoking. His smoking use included cigarettes. He quit after 1.00 year of use. He has never used smokeless tobacco. He reports that he does not drink alcohol or use drugs.  No Known Allergies  Family History  Problem Relation Age of Onset  . Heart failure Mother   . Heart failure Father   . Heart failure Sister     Prior to Admission medications   Medication Sig Start Date End Date Taking? Authorizing Provider  Aspirin-Caffeine (BC FAST PAIN RELIEF) 845-65 MG PACK Take 1 each by mouth as needed (pain).   Yes [provider]  ibuprofen (ADVIL,MOTRIN) 200 MG tablet Take 600 mg by mouth every 6 (six) hours as needed for moderate pain.   Yes [provider]  acetaminophen (TYLENOL) 500 MG tablet Take 2 tablets (1,000 mg total) by mouth every 6 (six) hours as needed. Patient not taking: Reported on 02/06/2018 10/03/16   Palma Holter, MD  fish oil-omega-3 fatty acids 1000 MG capsule Take 2 g by mouth daily.    [provider]  levothyroxine (SYNTHROID, LEVOTHROID) 100 MCG tablet Take 100 mcg by mouth daily before breakfast.    [provider]  Multiple Vitamin (MULTIVITAMIN WITH MINERALS) TABS Take 1 tablet by mouth daily.    [provider]  oxyCODONE (OXY IR/ROXICODONE) 5 MG immediate release tablet Take 0.5 tablets (2.5 mg  total) by mouth every 6 (six) hours as needed for severe pain. Patient not taking: Reported on 05/22/2017 10/03/16   Palma Holter, MD  simvastatin (ZOCOR) 80 MG tablet Take 1 tablet (80 mg total) by mouth at bedtime. Patient not taking: Reported on 02/19/2016 11/09/13   Croitoru, Mihai, MD  vitamin B-12 (CYANOCOBALAMIN) 1000 MCG tablet Take 1,000 mcg by mouth daily.    [provider]  vitamin C (ASCORBIC ACID) 500 MG tablet Take 500 mg by mouth daily.    [provider]  vitamin E 400 UNIT capsule Take 400 Units by mouth daily.    [provider]    Physical Exam: Vitals:   02/07/18 0019 02/07/18 0030 02/07/18 0100 02/07/18 0130  BP: (!) 145/77 (!) 146/78 132/75 140/72  Pulse: (!) 57 63 66 (!) 59  Resp: SpO2: 100% 98% 92% 95%      Constitutional: Moderately built and nourished. Vitals:   02/07/18 0019 02/07/18 0030 02/07/18 0100 02/07/18 0130  BP: (!) 145/77 (!) 146/78 132/75 140/72  Pulse: (!) 57 63 66 (!) 59  Resp: SpO2: 100% 98% 92% 95%   Eyes: Anicteric no pallor. ENMT: No discharge from the ears eyes nose or mouth. Neck: No mass palpated no neck rigidity.   Respiratory: No rhonchi or crepitations. Cardiovascular: S1-S2 heard no murmurs appreciated. Abdomen: Soft nontender bowel sounds present. Musculoskeletal: No edema no effusion. Skin: Chronic skin changes. Neurologic: Alert awake appears confused does not follow commands but moves all extremities appears little bit agitated. Psychiatric: Appears confused.   Labs on Admission: I have personally reviewed following labs and imaging studies  CBC: Recent Labs  Lab 02/06/18 2333  WBC 11.6*  NEUTROABS 10.7*  HGB 12.5*  HCT 38.9*  MCV 95.1  PLT 346   Basic Metabolic Panel: Recent Labs  Lab 02/06/18 2333  NA 146*  K 3.9  CL 112*  CO2 27  GLUCOSE 136*  BUN 25*  CREATININE 0.88  CALCIUM 8.8*   GFR: CrCl cannot be calculated (Unknown ideal weight.). Liver Function Tests: Recent Labs  Lab 02/06/18 2333  AST 30  ALT 16*  ALKPHOS 79  BILITOT 0.9  PROT 6.5  ALBUMIN 3.2*   No results for input(s): LIPASE, AMYLASE in the last 168 hours. No results for input(s): AMMONIA in the last 168 hours. Coagulation Profile: No results for input(s): INR, PROTIME in the last 168 hours. Cardiac Enzymes: No results for input(s): CKTOTAL, CKMB, CKMBINDEX, TROPONINI in the last 168 hours. BNP (last 3 results) No results for input(s): PROBNP in the last 8760 hours. HbA1C: No results for input(s): HGBA1C in the last  72 hours. CBG: No results for input(s): GLUCAP in the last 168 hours. Lipid Profile: No results for input(s): CHOL, HDL, LDLCALC, TRIG, CHOLHDL, LDLDIRECT in the last 72 hours. Thyroid Function Tests: No results for input(s): TSH, T4TOTAL, FREET4, T3FREE, THYROIDAB in the last 72 hours. Anemia Panel: No results for input(s): VITAMINB12, FOLATE, FERRITIN, TIBC, IRON, RETICCTPCT in the last 72 hours. Urine analysis:    Component Value Date/Time   COLORURINE YELLOW 02/07/2018 0057   APPEARANCEUR CLEAR 02/07/2018 0057   LABSPEC 1.020 02/07/2018 0057   PHURINE 5.0 02/07/2018 0057   GLUCOSEU NEGATIVE 02/07/2018 0057   HGBUR MODERATE (A) 02/07/2018 0057   BILIRUBINUR NEGATIVE 02/07/2018 0057   KETONESUR 5 (A) 02/07/2018 0057   PROTEINUR 30 (A) 02/07/2018 0057   UROBILINOGEN 1.0 06/27/2014 1514   NITRITE  NEGATIVE 02/07/2018 0057   LEUKOCYTESUR TRACE (A) 02/07/2018 0057   Sepsis Labs: (procalcitonin:4,lacticidven:4) )No results found for this or any previous visit (from the past 240 hour(s)).   Radiological Exams on Admission: Ct Head Wo Contrast  Result Date: 02/07/2018 CLINICAL DATA:  84 y/o  M; found on floor. EXAM: CT HEAD WITHOUT CONTRAST TECHNIQUE: Contiguous axial images were obtained from the base of the skull through the vertex without intravenous contrast. COMPARISON:  05/22/2017 CT head. FINDINGS: Brain: No evidence of acute infarction, hemorrhage, hydrocephalus, extra-axial collection or mass lesion/mass effect. Stable chronic microvascular ischemic changes and parenchymal volume loss of the brain. Vascular: Calcific atherosclerosis of the carotid siphons. No hyperdense vessel identified. Skull: Normal. Negative for fracture or focal lesion. Sinuses/Orbits: Mild paranasal sinus mucosal thickening. Small right maxillary sinus fluid level. Normal aeration of mastoid air cells. Bilateral intra-ocular lens replacement. Other: None. IMPRESSION: 1. No acute intracranial  abnormality identified. 2. Stable chronic microvascular ischemic changes and parenchymal volume loss of the brain. 3. Paranasal sinus disease with a right maxillary fluid level which may represent acute sinusitis. Electronically Signed   By: Mitzi Hansen M.D.   On: 02/07/2018 00:43    EKG: Independently reviewed.  Paced rhythm.  Assessment/Plan Principal Problem:   Fall Active Problems:   Hypertension   Cardiac pacemaker   Coronary artery disease   Alzheimer's dementia   Atrial fibrillation (HCC)    1. Fall -on reviewing patient's chart patient has had multiple falls and has no definite caregiver.  Patient is being taken care of by the neighbor.  Will need to reach patient's family if any in the morning.  We will get social work and physical therapy consult.  Check CK levels. 2. Hypernatremia -likely from dehydration we will gently hydrate follow metabolic panel. 3. Dementia with behavioral disturbances -did receive 1 dose of Haldol in the ER but if continues may need to get psychiatric consult. 4. Atrial fibrillation not on anticoagulation secondary risk of fall.  Presently rate controlled. 5. Hypothyroidism on Synthroid check TSH. 6. History of CAD status post stenting we will check troponin.  On simvastatin. 7. UTI on ceftriaxone follow urine cultures. 8. Normocytic normochromic anemia appears to be chronic.  Follow CBC anemia panel.   DVT prophylaxis: Lovenox. Code Status: Full code. Family Communication: No family at the bedside. Disposition Plan: To be determined. Consults called: Physical therapy and Child psychotherapist. Admission status: Observation.   Eduard Clos MD [facility] Pager [phone].  If 7PM-7AM, please contact night-coverage [email or web address] Password [email or web address]  02/07/2018, 3:41 AM

## 2018-02-07 NOTE — Progress Notes (Signed)
Initial Nutrition Assessment  DOCUMENTATION CODES:   Underweight  INTERVENTION:  Ensure Enlive po TID, each supplement provides 350 kcal and 20 grams of protein RD to monitor PO intake as well as supplement acceptance.   NUTRITION DIAGNOSIS:   Inadequate oral intake related to lethargy/confusion as evidenced by meal completion < 25%(Per RN).  GOAL:   Patient will meet greater than or equal to 90% of their needs  MONITOR:   Supplement acceptance, PO intake, Labs, Weight trends, I & O's, Skin  REASON FOR ASSESSMENT:   Low Braden, Other (Comment)(Low BMI)    ASSESSMENT:   84 y.o. M admitted on 02/06/18 for fall. PMH of dementia, CAD, a fib, sinus node dysfunction; pacemaker, hypothyroidism. Pt with hx of recent falls.   Pt is under observation.  No weights for over 1 year, unable to determine if there is weight loss.   Pt was sleeping with no family at bedside during dietetic intern visit. Per RN pt was combative earlier this morning and asked not to disturb him. Per RN pt didn't eat anything today as he was not able to.  Suspect pt has malnutrition due to severe muscle/fat depletions; unable to document at this time as unable to determine weight loss, intake PTA, or degree of wasting from age related sarcopenia due to pt mental status and no family at bedside.  Medications reviewed: levothyroxine (pt refused this am), ativan, MVI with minerals, omega-3, B-12, vitamin C, and rocephin.   Labs reviewed: Cl 114 (H), BG 127 (H), BUN 24 (H), CK total 614 (H), troponin I 0.07 (H), iron 33 (L), ferratin 387 (H).  NUTRITION - FOCUSED PHYSICAL EXAM:  Some wasting may be due to age related sarcopenia.     Most Recent Value  Orbital Region  Moderate depletion  Upper Arm Region  Severe depletion  Thoracic and Lumbar Region  Severe depletion  Buccal Region  Moderate depletion  Temple Region  Severe depletion  Clavicle Bone Region  Severe depletion  Clavicle and Acromion Bone  Region  Severe depletion  Scapular Bone Region  Unable to assess  Dorsal Hand  Unable to assess  Patellar Region  Moderate depletion  Anterior Thigh Region  Severe depletion  Posterior Calf Region  Moderate depletion  Edema (RD Assessment)  None  Hair  Reviewed  Eyes  Unable to assess  Mouth  Reviewed  Skin  Reviewed  Nails  Unable to assess       Diet Order:   Diet Order           Diet Heart Room service appropriate? Yes; Fluid consistency: Thin  Diet effective now          EDUCATION NEEDS:   Not appropriate for education at this time  Skin:  Skin Assessment: Reviewed RN Assessment  Last BM:  Unknown  Height:   Ht Readings from Last 1 Encounters:  02/07/18  (1.651 m)    Weight:   Wt Readings from Last 1 Encounters:  02/07/18 102 lb 15.3 oz (46.7 kg)    Ideal Body Weight:  61.81 kg  BMI:  Body mass index is 17.13 kg/m.  Estimated Nutritional Needs:   Kcal:  1450-1650 kcal  Protein:  70-85 grams   Fluid:  >/= 1.2 L or per MD   Sherrine Maples, Dietetic Intern

## 2018-02-07 NOTE — Care Management Note (Signed)
Case Management Note  Patient Details  Name: [name] MRN: [id number] Date of Birth: 1921-04-28  Subjective/Objective: 84 y/o m admitted w/Fall. Hx: dementia. Sjpoke to Antanice meadows(neighbor) c#[phone]-she states she is HCPOA-but no copy currently in hospital-she will bring a copy in tonight. She states patient lives @ home alone,has a rw, his Son has left the hme 5 years ago & has not returned-she is the only person who provides care for him.PT cons-await recc.                  Action/Plan:d/c plan SNF.   Expected Discharge Date:                  Expected Discharge Plan:  Skilled Nursing Facility  In-House Referral:  Clinical Social Work  Discharge planning Services  CM Consult  Post Acute Care Choice:    Choice offered to:     DME Arranged:    DME Agency:     HH Arranged:    HH Agency:     Status of Service:  In process, will continue to follow  If discussed at Long Length of Stay Meetings, dates discussed:    Additional Comments:  Lanier Clam, RN 02/07/2018, 4:17 PM

## 2018-02-07 NOTE — Progress Notes (Signed)
Went to pt room to administer medications. Pt ask about pain medication. Writer explained pain regimen and that pain free is not the expectation. Pt became verbal abusive using explict words, telling me to get out of her face and shut up or she would slap me. Pt husband became upset at the pt because she was yelling and Hotel managerthreating writer. Pt stated " I'll knock your head off and go to jail. I do care if I go to jail. They do have enough money to pay for my meds." CN notified.

## 2018-02-07 NOTE — Progress Notes (Signed)
CRITICAL VALUE ALERT  Critical Value: Troponin 0.07   Date & Time Notied: 02/07/18 1610  Provider Notified: Dr. Sharolyn Douglas  Orders Received/Actions taken:

## 2018-02-07 NOTE — Care Management Obs Status (Signed)
MEDICARE OBSERVATION STATUS NOTIFICATION   Patient Details  Name: Devin Clark MRN: [id number] Date of Birth: November 15, 1920   Medicare Observation Status Notification Given:  Yes    MahabirOlegario Messier, RN 02/07/2018, 4:05 PM

## 2018-02-07 NOTE — Progress Notes (Signed)
PT Cancellation Note  Patient Details Name: Devin Clark MRN: [id number] DOB: [birth year]   Cancelled Treatment:    Reason Eval/Treat Not Completed: Other (comment)(pt sleeping soundly, RN requested PT not disturb him as he was combative earlier. Will follow. )   Tamala SerUhlenberg, Kaedance Magos Kistler 02/07/2018, 10:17 AM [phone]

## 2018-02-07 NOTE — Progress Notes (Signed)
PT Cancellation Note  Patient Details Name: Devin Clark MRN: [id number] DOB: 11/03/1920   Cancelled Treatment:    Reason Eval/Treat Not Completed: Other (comment)(RN requested PT not disturb pt at present. Will follow. )   Tamala SerUhlenberg, Shandrea Lusk Kistler 02/07/2018, 1:55 PM [phone]

## 2018-02-08 DIAGNOSIS — F0281 Dementia in other diseases classified elsewhere with behavioral disturbance: Secondary | ICD-10-CM | POA: Diagnosis not present

## 2018-02-08 DIAGNOSIS — I10 Essential (primary) hypertension: Secondary | ICD-10-CM | POA: Diagnosis not present

## 2018-02-08 DIAGNOSIS — Z950 Presence of cardiac pacemaker: Secondary | ICD-10-CM | POA: Diagnosis not present

## 2018-02-08 DIAGNOSIS — N390 Urinary tract infection, site not specified: Principal | ICD-10-CM

## 2018-02-08 DIAGNOSIS — W19XXXD Unspecified fall, subsequent encounter: Secondary | ICD-10-CM

## 2018-02-08 DIAGNOSIS — I4891 Unspecified atrial fibrillation: Secondary | ICD-10-CM | POA: Diagnosis not present

## 2018-02-08 DIAGNOSIS — G308 Other Alzheimer's disease: Secondary | ICD-10-CM | POA: Diagnosis not present

## 2018-02-08 DIAGNOSIS — J189 Pneumonia, unspecified organism: Secondary | ICD-10-CM | POA: Diagnosis not present

## 2018-02-08 LAB — BASIC METABOLIC PANEL
Anion gap: 5 (ref 5–15)
BUN: 23 mg/dL — AB (ref 6–20)
CO2: 24 mmol/L (ref 22–32)
CREATININE: 0.78 mg/dL (ref 0.61–1.24)
Calcium: 8.2 mg/dL — ABNORMAL LOW (ref 8.9–10.3)
Chloride: 113 mmol/L — ABNORMAL HIGH (ref 101–111)
GFR calc Af Amer: >60 mL/min (ref >60)
GFR calc non Af Amer: >60 mL/min (ref >60)
Glucose, Bld: 104 mg/dL — ABNORMAL HIGH (ref 65–99)
Potassium: 4 mmol/L (ref 3.5–5.1)
SODIUM: 142 mmol/L (ref 135–145)

## 2018-02-08 LAB — CBC WITH DIFFERENTIAL/PLATELET
Basophils Absolute: 0 10*3/uL (ref 0.0–0.1)
Basophils Relative: 0 %
EOS ABS: 0 10*3/uL (ref 0.0–0.7)
EOS PCT: 0 %
HCT: 39.8 % (ref 39.0–52.0)
HEMOGLOBIN: 12.7 g/dL — AB (ref 13.0–17.0)
LYMPHS ABS: 0.6 10*3/uL — AB (ref 0.7–4.0)
Lymphocytes Relative: 9 %
MCH: 30 pg (ref 26.0–34.0)
MCHC: 31.9 g/dL (ref 30.0–36.0)
MCV: 94.1 fL (ref 78.0–100.0)
MONOS PCT: 7 %
Monocytes Absolute: 0.5 10*3/uL (ref 0.1–1.0)
NEUTROS PCT: 84 %
Neutro Abs: 5.7 10*3/uL (ref 1.7–7.7)
Platelets: 292 10*3/uL (ref 150–400)
RBC: 4.23 MIL/uL (ref 4.22–5.81)
RDW: 14.3 % (ref 11.5–15.5)
WBC: 6.5 10*3/uL (ref 4.0–10.5)

## 2018-02-08 LAB — STREP PNEUMONIAE URINARY ANTIGEN: Strep Pneumo Urinary Antigen: NEGATIVE

## 2018-02-08 LAB — CK: CK TOTAL: 474 U/L — AB (ref 49–397)

## 2018-02-08 NOTE — Evaluation (Signed)
Physical Therapy Evaluation Patient Details Name: Devin Clark MRN: [id number] DOB: Dec 25, 1920 Today's Date: 02/08/2018   History of Present Illness  84 y.o. male with history of dementia, CAD status post stenting, atrial fibrillation, sinus node dysfunction status post pacemaker placement, hypothyroidism was brought to the ER after patient was found on the floor at home.  Clinical Impression  Patient presents with decreased independence and safety with mobility due to deficits listed in PT problem list.  Feel pt will need STSNF level rehab at d/c.  PT to follow acutely.    Follow Up Recommendations SNF;Supervision/Assistance - 24 hour    Equipment Recommendations  None recommended by PT    Recommendations for Other Services       Precautions / Restrictions Precautions Precautions: Fall      Mobility  Bed Mobility Overal bed mobility: Needs Assistance Bed Mobility: Supine to Sit;Sit to Supine     Supine to sit: Mod assist;HOB elevated Sit to supine: Mod assist;+2 for safety/equipment   General bed mobility comments: assist for legs off bed and to lift trunk with cues, assist for legs in bed to supine  Transfers Overall transfer level: Needs assistance Equipment used: Rolling walker (2 wheeled) Transfers: Sit to/from Stand Sit to Stand: Mod assist;+2 physical assistance         General transfer comment: lifting assist from EOB pt posterior initially  Ambulation/Gait Ambulation/Gait assistance: Mod assist;Max assist Ambulation Distance (Feet): 130 Feet Assistive device: Rolling walker (2 wheeled);1 person hand held assist Gait Pattern/deviations: Step-through pattern;Decreased stride length;Trunk flexed;Scissoring     General Gait Details: initially ataxic and scissoring with one person HHA max A, then with walker mod A for walker management and safety/balance  Stairs            Wheelchair Mobility    Modified Rankin (Stroke Patients Only)        Balance Overall balance assessment: Needs assistance   Sitting balance-Leahy Scale: Good   Postural control: Posterior lean   Standing balance-Leahy Scale: Poor Standing balance comment: intially posterior bias                             Pertinent Vitals/Pain Pain Assessment: No/denies pain    Home Living Family/patient expects to be discharged to:: Skilled nursing facility Living Arrangements: Alone                    Prior Function           Comments: was living alone, unable to determine if he was cooking for himself or performing ADL's     Hand Dominance        Extremity/Trunk Assessment   Upper Extremity Assessment Upper Extremity Assessment: LUE deficits/detail;RUE deficits/detail RUE Deficits / Details: limited shoulder AAROM worse than L about 80 LUE Deficits / Details: limited shoulder AAROM about 90    Lower Extremity Assessment Lower Extremity Assessment: Generalized weakness    Cervical / Trunk Assessment Cervical / Trunk Assessment: Kyphotic  Communication   Communication: HOH  Cognition Arousal/Alertness: Awake/alert Behavior During Therapy: WFL for tasks assessed/performed Overall Cognitive Status: No family/caregiver present to determine baseline cognitive functioning                                 General Comments: h/o dementia, but was living alone, does not know city and refuses to believe we are in  the hospital, states he is 31 and that his birthday is [birth year], when asked what month he said every month.      General Comments General comments (skin integrity, edema, etc.): in 2 pt restraints, attempted to have pt in recliner at nursing station, but staff states cannot stay there for HIPPA violation, using telesitters/sitters, but no staff today for sitter, so back to bed in restraints per RN    Exercises     Assessment/Plan    PT Assessment Patient needs continued PT services  PT Problem List  Decreased mobility;Decreased safety awareness;Decreased balance;Decreased knowledge of use of DME;Decreased knowledge of precautions       PT Treatment Interventions DME instruction;Functional mobility training;Balance training;Patient/family education;Therapeutic activities;Gait training;Neuromuscular re-education;Therapeutic exercise    PT Goals (Current goals can be found in the Care Plan section)  Acute Rehab PT Goals Patient Stated Goal: unable PT Goal Formulation: Patient unable to participate in goal setting Time For Goal Achievement: 02/22/18 Potential to Achieve Goals: Fair    Frequency Min 2X/week   Barriers to discharge        Co-evaluation               AM-PAC PT "6 Clicks" Daily Activity  Outcome Measure Difficulty turning over in bed (including adjusting bedclothes, sheets and blankets)?: A Lot Difficulty moving from lying on back to sitting on the side of the bed? : Unable Difficulty sitting down on and standing up from a chair with arms (e.g., wheelchair, bedside commode, etc,.)?: Unable Help needed moving to and from a bed to chair (including a wheelchair)?: A Lot Help needed walking in hospital room?: A Lot Help needed climbing 3-5 steps with a railing? : Total 6 Click Score: 9    End of Session Equipment Utilized During Treatment: Gait belt Activity Tolerance: Patient tolerated treatment well Patient left: in bed;with call bell/phone within reach;with bed alarm set;with restraints reapplied   PT Visit Diagnosis: Other abnormalities of gait and mobility (R26.89);Other symptoms and signs involving the nervous system (R29.898)    Time: 1355-1431 PT Time Calculation (min) (ACUTE ONLY): 36 min   Charges:   PT Evaluation $PT Eval Moderate Complexity: 1 Mod PT Treatments $Gait Training: 8-22 mins   PT G CodesSheran Lawless, [place] [phone] 02/08/2018   Elray Mcgregor 02/08/2018, 4:24 PM

## 2018-02-08 NOTE — Progress Notes (Signed)
PROGRESS NOTE  Devin BruinsWilliam C Clark ZOX:[id number] DOB: 1921/06/24 DOA: 02/06/2018 PCP: Patient, No Pcp Per  HPI/Recap of past 6724 hours: 84 year old male with history of dementia, CAD, A. fib, sinus node dysfunction status post pacemaker placement, hypothyroidism was brought to the ED after neighbor found patient on the floor at home, unsure how long patient was down.  Neighbor usually takes care of the patient.  Patient has had multiple falls per chart review.  In the ER CT head and x-ray pelvis negative for any acute fracture.  UA concerning for UTI.  Sodium noted to be around 147.  Patient admitted for further management and possible nursing home placement.  Today, pt pleasant, awake, just had breakfast, still in restraints as pt was trying to get out of bed overnight. Pt denies any new complaints, although confused/not oriented  Assessment/Plan: Principal Problem:   Fall Active Problems:   Hypertension   Cardiac pacemaker   Coronary artery disease   Alzheimer's dementia   Atrial fibrillation (HCC)  History of multiple falls Likely due to dehydration/possible UTI/dementia CK level 614, downtrending CT head and x-ray pelvis negative for acute changes/fractures Continue gentle IV hydration PT on board, rec SNF Social worker consulted for possible NH placement Fall precautions  UTI Afebrile with resolved leukocytosis UA with trace leukocytes, many bacteria, mucus present, WBC 6-10 UC > 100,000 E. Fecalis, await MIC Continue IV ceftriaxone  ??CAP CXR shows left basilar opacity Urine strep pneumo negative, Legionella pending Continue IV ceftriaxone, azithromycin  Hypernatremia Improving Likely due to dehydration Continue IV fluids  Daily BMP  Elevated troponin/history of CAD Chest pain-free Flat trend, EKG showed paced rhythm No further intervention Discontinued simvastatin 80 mg daily due to mild elevated CKD and age  Chronic A. Fib/sinus node dysfunction Status post  pacemaker Currently rate controlled Not on any anticoagulation due to fall risk  Hypothyroidism TSH 7.2, free T4 0.74 Given advanced age, low BMI, will keep Synthroid at the same dose Continue Synthroid  Normocytic anemia/iron deficiency Hemoglobin at baseline Anemia panel showed iron 33, saturation 15, ferritin 387, folate 11.5, B12 639 Daily CBC  Dementia with behavioral disturbances Assess the need for restrain daily Fall precautions  Malnutrition Dietitian consulted     Code Status: Full  Family Communication: None at bedside  Disposition Plan: SNF   Consultants:  None  Procedures:  None  Antimicrobials:  IV Rocephin  Azithromycin  DVT prophylaxis: Lovenox   Objective: Vitals:   02/07/18 2046 02/07/18 2122 02/08/18 0402 02/08/18 1227  BP: 125/75  125/68 128/74  Pulse: 61 (!) 59 (!) 59 (!) 56  Resp: 18  18 16   Temp:  98.2 F (36.8 C) (!) 97.5 F (36.4 C) 98.4 F (36.9 C)  TempSrc:  Oral Oral Axillary  SpO2:  100% 93% 100%  Weight:      Height:        Intake/Output Summary (Last 24 hours) at 02/08/2018 1715 Last data filed at 02/08/2018 1600 Gross per 24 hour  Intake 2337.5 ml  Output 450 ml  Net 1887.5 ml   Filed Weights   02/07/18 0450  Weight: 46.7 kg (102 lb 15.3 oz)    Exam:   General: NAD  Cardiovascular: S1, S2 present  Respiratory: CTAB  Abdomen: Soft, nontender, nondistended, bowel sounds present  Musculoskeletal: No pedal edema bilaterally  Skin: Normal  Psychiatry: Normal mood   Data Reviewed: CBC: Recent Labs  Lab 02/06/18 2333 02/07/18 0535 02/08/18 0526  WBC 11.6* 12.7* 6.5  NEUTROABS 10.7*  --  5.7  HGB 12.5* 12.6* 12.7*  HCT 38.9* 40.2 39.8  MCV 95.1 95.7 94.1  PLT 346 382 292   Basic Metabolic Panel: Recent Labs  Lab 02/06/18 2333 02/07/18 0535 02/08/18 0526  NA 146* 148* 142  K 3.9 3.7 4.0  CL 112* 114* 113*  CO2 27 25 24   GLUCOSE 136* 127* 104*  BUN 25* 24* 23*  CREATININE 0.88  0.90 0.78  CALCIUM 8.8* 8.8* 8.2*  MG  --  2.1  --    GFR: Estimated Creatinine Clearance: 37.3 mL/min (by C-G formula based on SCr of 0.78 mg/dL). Liver Function Tests: Recent Labs  Lab 02/06/18 2333  AST 30  ALT 16*  ALKPHOS 79  BILITOT 0.9  PROT 6.5  ALBUMIN 3.2*   No results for input(s): LIPASE, AMYLASE in the last 168 hours. Recent Labs  Lab 02/07/18 0535  AMMONIA 18   Coagulation Profile: No results for input(s): INR, PROTIME in the last 168 hours. Cardiac Enzymes: Recent Labs  Lab 02/07/18 0535 02/07/18 1152 02/07/18 1811 02/08/18 0526  CKTOTAL 614*  --   --  474*  TROPONINI 0.07* 0.07* 0.10*  --    BNP (last 3 results) No results for input(s): PROBNP in the last 8760 hours. HbA1C: No results for input(s): HGBA1C in the last 72 hours. CBG: No results for input(s): GLUCAP in the last 168 hours. Lipid Profile: No results for input(s): CHOL, HDL, LDLCALC, TRIG, CHOLHDL, LDLDIRECT in the last 72 hours. Thyroid Function Tests: Recent Labs    02/07/18 0535 02/07/18 0819  TSH 7.208*  --   FREET4  --  0.74*   Anemia Panel: Recent Labs    02/07/18 0535  VITAMINB12 639  FOLATE 11.5  FERRITIN 387*  TIBC 214*  IRON 33*  RETICCTPCT 1.0   Urine analysis:    Component Value Date/Time   COLORURINE YELLOW 02/07/2018 0057   APPEARANCEUR CLEAR 02/07/2018 0057   LABSPEC 1.020 02/07/2018 0057   PHURINE 5.0 02/07/2018 0057   GLUCOSEU NEGATIVE 02/07/2018 0057   HGBUR MODERATE (A) 02/07/2018 0057   BILIRUBINUR NEGATIVE 02/07/2018 0057   KETONESUR 5 (A) 02/07/2018 0057   PROTEINUR 30 (A) 02/07/2018 0057   UROBILINOGEN 1.0 06/27/2014 1514   NITRITE NEGATIVE 02/07/2018 0057   LEUKOCYTESUR TRACE (A) 02/07/2018 0057   Sepsis Labs: @LABRCNTIP (procalcitonin:4,lacticidven:4)  ) Recent Results (from the past 240 hour(s))  Urine culture     Status: Abnormal (Preliminary result)   Collection Time: 02/07/18  1:37 AM  Result Value Ref Range Status   Specimen  Description   Final    URINE, RANDOM Performed at [facility], [address] [place], [place] [address]    Special Requests   Final    NONE Performed at [facility], [address] [place], [place] [address]    Culture (A)  Final    >=100,000 COLONIES/mL ENTEROCOCCUS FAECALIS SUSCEPTIBILITIES TO FOLLOW Performed at [facility], [address] [place], [place] [address]    Report Status PENDING  Incomplete      Studies: No results found.  Scheduled Meds: . azithromycin  500 mg Oral Daily  . enoxaparin (LOVENOX) injection  30 mg Subcutaneous Daily  . feeding supplement (ENSURE ENLIVE)  237 mL Oral TID BM  . fluticasone  1 spray Each Nare Daily  . levothyroxine  100 mcg Oral QAC breakfast  . LORazepam  0.5 mg Intravenous Once  . multivitamin with minerals  1 tablet Oral Daily  .  omega-3 acid ethyl esters  1 g Oral Daily  . vitamin B-12  1,000 mcg Oral Daily  . vitamin C  500 mg Oral Daily    Continuous Infusions: . cefTRIAXone (ROCEPHIN)  IV Stopped (02/07/18 2225)  . dextrose 5 % and 0.45% NaCl 50 mL/hr at 02/08/18 1600     LOS: 0 days     Briant Cedar, MD [facility]  If 7PM-7AM, please contact night-coverage [email or web address] Password [email or web address] 02/08/2018, 5:15 PM

## 2018-02-08 NOTE — Progress Notes (Signed)
Patient is calm and lucid.  RN notified the PCP to discontinue the non-violent restraints.

## 2018-02-08 NOTE — Progress Notes (Signed)
Patient is restless and agitated.Trying to get out of bed. PCP was notified

## 2018-02-09 DIAGNOSIS — Z681 Body mass index (BMI) 19 or less, adult: Secondary | ICD-10-CM | POA: Diagnosis not present

## 2018-02-09 DIAGNOSIS — N390 Urinary tract infection, site not specified: Secondary | ICD-10-CM | POA: Diagnosis present

## 2018-02-09 DIAGNOSIS — I482 Chronic atrial fibrillation: Secondary | ICD-10-CM | POA: Diagnosis present

## 2018-02-09 DIAGNOSIS — F0281 Dementia in other diseases classified elsewhere with behavioral disturbance: Secondary | ICD-10-CM | POA: Diagnosis present

## 2018-02-09 DIAGNOSIS — N189 Chronic kidney disease, unspecified: Secondary | ICD-10-CM | POA: Diagnosis present

## 2018-02-09 DIAGNOSIS — R64 Cachexia: Secondary | ICD-10-CM | POA: Diagnosis present

## 2018-02-09 DIAGNOSIS — I10 Essential (primary) hypertension: Secondary | ICD-10-CM | POA: Diagnosis not present

## 2018-02-09 DIAGNOSIS — G309 Alzheimer's disease, unspecified: Secondary | ICD-10-CM | POA: Diagnosis present

## 2018-02-09 DIAGNOSIS — G308 Other Alzheimer's disease: Secondary | ICD-10-CM | POA: Diagnosis not present

## 2018-02-09 DIAGNOSIS — R627 Adult failure to thrive: Secondary | ICD-10-CM | POA: Diagnosis present

## 2018-02-09 DIAGNOSIS — Y92009 Unspecified place in unspecified non-institutional (private) residence as the place of occurrence of the external cause: Secondary | ICD-10-CM | POA: Diagnosis not present

## 2018-02-09 DIAGNOSIS — E039 Hypothyroidism, unspecified: Secondary | ICD-10-CM | POA: Diagnosis present

## 2018-02-09 DIAGNOSIS — J690 Pneumonitis due to inhalation of food and vomit: Secondary | ICD-10-CM | POA: Diagnosis present

## 2018-02-09 DIAGNOSIS — E785 Hyperlipidemia, unspecified: Secondary | ICD-10-CM | POA: Diagnosis present

## 2018-02-09 DIAGNOSIS — Z79890 Hormone replacement therapy (postmenopausal): Secondary | ICD-10-CM | POA: Diagnosis not present

## 2018-02-09 DIAGNOSIS — M6282 Rhabdomyolysis: Secondary | ICD-10-CM | POA: Diagnosis present

## 2018-02-09 DIAGNOSIS — W19XXXA Unspecified fall, initial encounter: Secondary | ICD-10-CM | POA: Diagnosis not present

## 2018-02-09 DIAGNOSIS — E46 Unspecified protein-calorie malnutrition: Secondary | ICD-10-CM | POA: Diagnosis present

## 2018-02-09 DIAGNOSIS — I4891 Unspecified atrial fibrillation: Secondary | ICD-10-CM | POA: Diagnosis not present

## 2018-02-09 DIAGNOSIS — Z87891 Personal history of nicotine dependence: Secondary | ICD-10-CM | POA: Diagnosis not present

## 2018-02-09 DIAGNOSIS — W19XXXD Unspecified fall, subsequent encounter: Secondary | ICD-10-CM | POA: Diagnosis not present

## 2018-02-09 DIAGNOSIS — B952 Enterococcus as the cause of diseases classified elsewhere: Secondary | ICD-10-CM | POA: Diagnosis present

## 2018-02-09 DIAGNOSIS — I2510 Atherosclerotic heart disease of native coronary artery without angina pectoris: Secondary | ICD-10-CM | POA: Diagnosis present

## 2018-02-09 DIAGNOSIS — Z955 Presence of coronary angioplasty implant and graft: Secondary | ICD-10-CM | POA: Diagnosis not present

## 2018-02-09 DIAGNOSIS — Z8249 Family history of ischemic heart disease and other diseases of the circulatory system: Secondary | ICD-10-CM | POA: Diagnosis not present

## 2018-02-09 DIAGNOSIS — Z9842 Cataract extraction status, left eye: Secondary | ICD-10-CM | POA: Diagnosis not present

## 2018-02-09 DIAGNOSIS — E871 Hypo-osmolality and hyponatremia: Secondary | ICD-10-CM | POA: Diagnosis present

## 2018-02-09 DIAGNOSIS — E870 Hyperosmolality and hypernatremia: Secondary | ICD-10-CM | POA: Diagnosis present

## 2018-02-09 DIAGNOSIS — Z9841 Cataract extraction status, right eye: Secondary | ICD-10-CM | POA: Diagnosis not present

## 2018-02-09 DIAGNOSIS — Z950 Presence of cardiac pacemaker: Secondary | ICD-10-CM | POA: Diagnosis not present

## 2018-02-09 DIAGNOSIS — I129 Hypertensive chronic kidney disease with stage 1 through stage 4 chronic kidney disease, or unspecified chronic kidney disease: Secondary | ICD-10-CM | POA: Diagnosis present

## 2018-02-09 LAB — CBC WITH DIFFERENTIAL/PLATELET
BASOS ABS: 0 10*3/uL (ref 0.0–0.1)
BASOS PCT: 0 %
Eosinophils Absolute: 0.1 10*3/uL (ref 0.0–0.7)
Eosinophils Relative: 1 %
HCT: 36.5 % — ABNORMAL LOW (ref 39.0–52.0)
HEMOGLOBIN: 12 g/dL — AB (ref 13.0–17.0)
Lymphocytes Relative: 9 %
Lymphs Abs: 0.8 10*3/uL (ref 0.7–4.0)
MCH: 30.1 pg (ref 26.0–34.0)
MCHC: 32.9 g/dL (ref 30.0–36.0)
MCV: 91.5 fL (ref 78.0–100.0)
Monocytes Absolute: 0.5 10*3/uL (ref 0.1–1.0)
Monocytes Relative: 6 %
NEUTROS ABS: 7.7 10*3/uL (ref 1.7–7.7)
NEUTROS PCT: 84 %
Platelets: 353 10*3/uL (ref 150–400)
RBC: 3.99 MIL/uL — AB (ref 4.22–5.81)
RDW: 14 % (ref 11.5–15.5)
WBC: 9.2 10*3/uL (ref 4.0–10.5)

## 2018-02-09 LAB — URINE CULTURE

## 2018-02-09 LAB — BASIC METABOLIC PANEL
ANION GAP: 9 (ref 5–15)
BUN: 21 mg/dL — ABNORMAL HIGH (ref 6–20)
CALCIUM: 7.9 mg/dL — AB (ref 8.9–10.3)
CHLORIDE: 105 mmol/L (ref 101–111)
CO2: 22 mmol/L (ref 22–32)
Creatinine, Ser: 0.65 mg/dL (ref 0.61–1.24)
GFR calc non Af Amer: >60 mL/min (ref >60)
Glucose, Bld: 85 mg/dL (ref 65–99)
POTASSIUM: 3.5 mmol/L (ref 3.5–5.1)
Sodium: 136 mmol/L (ref 135–145)

## 2018-02-09 LAB — CK: Total CK: 305 U/L (ref 49–397)

## 2018-02-09 MED ORDER — RISPERIDONE 0.5 MG PO TABS
0.5000 mg | ORAL_TABLET | Freq: Two times a day (BID) | ORAL | Status: DC
  Administered 2018-02-09 – 2018-02-11 (×5): 0.5 mg via ORAL
  Filled 2018-02-09 (×5): qty 1

## 2018-02-09 NOTE — Progress Notes (Signed)
PROGRESS NOTE  Devin Clark ZOX:[id number] DOB: April 08, 1921 DOA: 02/06/2018 PCP: Patient, No Pcp Per  HPI/Recap of past 5324 hours: 84 year old male with history of dementia, CAD, A. fib, sinus node dysfunction status post pacemaker placement, hypothyroidism was brought to the ED after neighbor found patient on the floor at home, unsure how long patient was down.  Neighbor usually takes care of the patient.  Patient has had multiple falls per chart review.  In the ER CT head and x-ray pelvis negative for any acute fracture.  UA concerning for UTI.  Sodium noted to be around 147.  Patient admitted for further management and possible nursing home placement.  Today, pt restless, agitated, still in restraints as pt was trying to get out of bed overnight.  Assessment/Plan: Principal Problem:   Fall Active Problems:   Hypertension   Cardiac pacemaker   Coronary artery disease   Alzheimer's dementia   Atrial fibrillation (HCC)  History of multiple falls Likely due to dehydration/possible UTI/dementia CK level 614, now WNL CT head and x-ray pelvis negative for acute changes/fractures PT on board, rec SNF Social worker consulted for possible NH placement Fall precautions  UTI Afebrile with resolved leukocytosis UA with trace leukocytes, many bacteria, mucus present, WBC 6-10 UC > 100,000 E. Fecalis, await MIC Continue IV ceftriaxone  ??CAP CXR shows left basilar opacity Urine strep pneumo negative, Legionella pending Continue IV ceftriaxone, azithromycin  Hypernatremia Resolved Likely due to dehydration  Elevated troponin/history of CAD Chest pain-free Flat trend, EKG showed paced rhythm No further intervention Discontinued simvastatin 80 mg daily due to mild elevated CKD and age  Chronic A. Fib/sinus node dysfunction Status post pacemaker Currently rate controlled Not on any anticoagulation due to fall risk  Hypothyroidism TSH 7.2, free T4 0.74 Given advanced age,  low BMI, will keep Synthroid at the same dose Continue Synthroid  Normocytic anemia/iron deficiency Hemoglobin at baseline Anemia panel showed iron 33, saturation 15, ferritin 387, folate 11.5, B12 639  Dementia with behavioral disturbances Started pt on risperidone 0.5mg  BID Assess the need for restrain daily Fall precautions  Malnutrition Dietitian consulted     Code Status: Full  Family Communication: None at bedside  Disposition Plan: SNF   Consultants:  None  Procedures:  None  Antimicrobials:  IV Rocephin  Azithromycin  DVT prophylaxis: Lovenox   Objective: Vitals:   02/08/18 1227 02/08/18 2049 02/09/18 0639 02/09/18 1329  BP: 128/74 124/74 122/71 135/73  Pulse: (!) 56 (!) 59 60 65  Resp: 16 20 16 16   Temp: 98.4 F (36.9 C) (!) 97.5 F (36.4 C) 98.1 F (36.7 C) (!) 97.5 F (36.4 C)  TempSrc: Axillary Oral Oral Oral  SpO2: 100% 100% 100% 95%  Weight:      Height:        Intake/Output Summary (Last 24 hours) at 02/09/2018 1642 Last data filed at 02/09/2018 1500 Gross per 24 hour  Intake 1890 ml  Output 1250 ml  Net 640 ml   Filed Weights   02/07/18 0450  Weight: 46.7 kg (102 lb 15.3 oz)    Exam:   General: NAD  Cardiovascular: S1, S2 present  Respiratory: CTAB  Abdomen: Soft, nontender, nondistended, bowel sounds present  Musculoskeletal: No pedal edema bilaterally  Skin: Normal  Psychiatry: Normal mood   Data Reviewed: CBC: Recent Labs  Lab 02/06/18 2333 02/07/18 0535 02/08/18 0526 02/09/18 0448  WBC 11.6* 12.7* 6.5 9.2  NEUTROABS 10.7*  --  5.7 7.7  HGB 12.5* 12.6* 12.7*  12.0*  HCT 38.9* 40.2 39.8 36.5*  MCV 95.1 95.7 94.1 91.5  PLT 346 382 292 353   Basic Metabolic Panel: Recent Labs  Lab 02/06/18 2333 02/07/18 0535 02/08/18 0526 02/09/18 0448  NA 146* 148* 142 136  K 3.9 3.7 4.0 3.5  CL 112* 114* 113* 105  CO2 27 25 24 22   GLUCOSE 136* 127* 104* 85  BUN 25* 24* 23* 21*  CREATININE 0.88 0.90 0.78  0.65  CALCIUM 8.8* 8.8* 8.2* 7.9*  MG  --  2.1  --   --    GFR: Estimated Creatinine Clearance: 37.3 mL/min (by C-G formula based on SCr of 0.65 mg/dL). Liver Function Tests: Recent Labs  Lab 02/06/18 2333  AST 30  ALT 16*  ALKPHOS 79  BILITOT 0.9  PROT 6.5  ALBUMIN 3.2*   No results for input(s): LIPASE, AMYLASE in the last 168 hours. Recent Labs  Lab 02/07/18 0535  AMMONIA 18   Coagulation Profile: No results for input(s): INR, PROTIME in the last 168 hours. Cardiac Enzymes: Recent Labs  Lab 02/07/18 0535 02/07/18 1152 02/07/18 1811 02/08/18 0526 02/09/18 0448  CKTOTAL 614*  --   --  474* 305  TROPONINI 0.07* 0.07* 0.10*  --   --    BNP (last 3 results) No results for input(s): PROBNP in the last 8760 hours. HbA1C: No results for input(s): HGBA1C in the last 72 hours. CBG: No results for input(s): GLUCAP in the last 168 hours. Lipid Profile: No results for input(s): CHOL, HDL, LDLCALC, TRIG, CHOLHDL, LDLDIRECT in the last 72 hours. Thyroid Function Tests: Recent Labs    02/07/18 0535 02/07/18 0819  TSH 7.208*  --   FREET4  --  0.74*   Anemia Panel: Recent Labs    02/07/18 0535  VITAMINB12 639  FOLATE 11.5  FERRITIN 387*  TIBC 214*  IRON 33*  RETICCTPCT 1.0   Urine analysis:    Component Value Date/Time   COLORURINE YELLOW 02/07/2018 0057   APPEARANCEUR CLEAR 02/07/2018 0057   LABSPEC 1.020 02/07/2018 0057   PHURINE 5.0 02/07/2018 0057   GLUCOSEU NEGATIVE 02/07/2018 0057   HGBUR MODERATE (A) 02/07/2018 0057   BILIRUBINUR NEGATIVE 02/07/2018 0057   KETONESUR 5 (A) 02/07/2018 0057   PROTEINUR 30 (A) 02/07/2018 0057   UROBILINOGEN 1.0 06/27/2014 1514   NITRITE NEGATIVE 02/07/2018 0057   LEUKOCYTESUR TRACE (A) 02/07/2018 0057   Sepsis Labs: @LABRCNTIP (procalcitonin:4,lacticidven:4)  ) Recent Results (from the past 240 hour(s))  Urine culture     Status: Abnormal   Collection Time: 02/07/18  1:37 AM  Result Value Ref Range Status    Specimen Description   Final    URINE, RANDOM Performed at [facility], [address] [place], [place] [address]    Special Requests   Final    NONE Performed at [facility], [address], [place], [place] [address]    Culture >=100,000 COLONIES/mL ENTEROCOCCUS FAECALIS (A)  Final   Report Status 02/09/2018 FINAL  Final   Organism ID, Bacteria ENTEROCOCCUS FAECALIS (A)  Final      Susceptibility   Enterococcus faecalis - MIC*    AMPICILLIN <=2 SENSITIVE Sensitive     LEVOFLOXACIN 1 SENSITIVE Sensitive     NITROFURANTOIN <=16 SENSITIVE Sensitive     VANCOMYCIN 1 SENSITIVE Sensitive     * >=100,000 COLONIES/mL ENTEROCOCCUS FAECALIS      Studies: No results found.  Scheduled Meds: . azithromycin  500 mg Oral Daily  .  enoxaparin (LOVENOX) injection  30 mg Subcutaneous Daily  . feeding supplement (ENSURE ENLIVE)  237 mL Oral TID BM  . fluticasone  1 spray Each Nare Daily  . levothyroxine  100 mcg Oral QAC breakfast  . LORazepam  0.5 mg Intravenous Once  . multivitamin with minerals  1 tablet Oral Daily  . omega-3 acid ethyl esters  1 g Oral Daily  . risperiDONE  0.5 mg Oral BID  . vitamin B-12  1,000 mcg Oral Daily  . vitamin C  500 mg Oral Daily    Continuous Infusions: . cefTRIAXone (ROCEPHIN)  IV Stopped (02/09/18 0237)     LOS: 0 days     Briant Cedar, MD [facility]  If 7PM-7AM, please contact night-coverage [email or web address] Password [facility] 02/09/2018, 4:42 PM

## 2018-02-09 NOTE — Progress Notes (Signed)
Trial of pt out of restraints failed.  Continues to be confused, attempting to get oob with assistance, pulling on medical equipment.  Attempts to reorient and educate fail.  Will continue bilateral wrist restraints and 4 SR up.  Have notified on call to obtain an new order and am awaiting reply.

## 2018-02-10 LAB — LEGIONELLA PNEUMOPHILA SEROGP 1 UR AG: L. pneumophila Serogp 1 Ur Ag: NEGATIVE

## 2018-02-10 MED ORDER — AMOXICILLIN-POT CLAVULANATE 875-125 MG PO TABS
1.0000 | ORAL_TABLET | Freq: Two times a day (BID) | ORAL | Status: DC
  Administered 2018-02-10 – 2018-02-11 (×4): 1 via ORAL
  Filled 2018-02-10 (×5): qty 1

## 2018-02-10 NOTE — Evaluation (Signed)
Clinical/Bedside Swallow Evaluation Patient Details  Name: Devin Clark MRN: [id number] Date of Birth: 12-16-1920  Today's Date: 02/10/2018 Time: SLP Start Time (ACUTE ONLY): 1145 SLP Stop Time (ACUTE ONLY): 1215 SLP Time Calculation (min) (ACUTE ONLY): 30 min  Past Medical History:  Past Medical History:  Diagnosis Date  . Atrial fibrillation (HCC)   . CAD (coronary artery disease)   . Compression fracture 2015  . Dyslipidemia   . Hypertension   . Hypothyroidism   . Sinus node dysfunction (HCC)    St.Jude pacemaker 06/05/2011  . Stented coronary artery 1998   Past Surgical History:  Past Surgical History:  Procedure Laterality Date  . CARDIAC CATHETERIZATION  03/07/2000   100% CX,widely patent stent in RCA  . CARDIAC CATHETERIZATION  03/26/2007   100% CX,widely patnet stent in RCA  . CATARACT EXTRACTION, BILATERAL  2011  . CHEST TUBE INSERTION  06/06/11   right iatrogenic pneumothorax  . CORONARY ANGIOPLASTY WITH STENT PLACEMENT  12/30/1996   stent RCA  . NM MYOVIEW LTD  03/04/2007   High risk,mild-mod anterolateral/inferolateral ischemia  . PACEMAKER INSERTION  06/05/11   St.Jude Accent DR  . patch perforated duodenal ulcer  05/28/11   Cheree DittoGraham patch   HPI:  84 [facility] - PMH + for dementia, Afib, HTN, UTI, sodium levels 147, CAD.  Pt found to have left basilar ATX - concerning for pna per imaging study.  Swallow eval ordered.     Assessment / Plan / Recommendation Clinical Impression  Pt's largest aspiration risk is behavioral at this time as pt observed to stuff his mouth with more po intake without clearing.  His tray was placed on his tray table and he was eating lying flat.  SLP and NT arrived to slide pt up in bed when he started overtly aspirating - evidenced by ongoing cough and expectoration of food.  Pt with much improved tolerance of puree/thin with full/strict supervision to assure adequate clearance.  Will modify diet to mitigate aspiration  risk, pt may benefit from MBS in future dependent on goals of care and tolerance.  No family present to establish baseline at this time.   SLP Visit Diagnosis: Dysphagia, oral phase (R13.11);Dysphagia, unspecified (R13.10)    Aspiration Risk  Moderate aspiration risk    Diet Recommendation Dysphagia 1 (Puree);Thin liquid   Liquid Administration via: Cup;Straw Medication Administration: Whole meds with puree Supervision: Patient able to self feed;Staff to assist with self feeding Compensations: Slow rate;Small sips/bites(please feed pt, he is impulsive) Postural Changes: Seated upright at 90 degrees;Remain upright for at least 30 minutes after po intake    Other  Recommendations Oral Care Recommendations: Oral care BID   Follow up Recommendations (tbd)      Frequency and Duration min 2x/week  1 week       Prognosis Prognosis for Safe Diet Advancement: Guarded      Swallow Study   General Date of Onset: 02/10/18 HPI: 84 [facility] - PMH + for dementia, Afib, HTN, UTI, sodium levels 147, CAD.  Pt found to have left basilar ATX - concerning for pna per imaging study.  Swallow eval ordered.   Type of Study: Bedside Swallow Evaluation Previous Swallow Assessment: 06/02/2011 UGI, severe esophageal dysmotility, hiatal hernia Diet Prior to this Study: Regular;Thin liquids Temperature Spikes Noted: No Respiratory Status: Room air History of Recent Intubation: No Behavior/Cognition: Alert;Doesn't follow directions;Agitated(pt grabbed SLP by arm when SLP moving tray due  to aspiration episode) Oral Cavity Assessment: Other (comment);Within Functional Limits Oral Care Completed by SLP: No Oral Cavity - Dentition: Adequate natural dentition Vision: Functional for self-feeding Self-Feeding Abilities: Needs assist(pt impulsive and eats rapidly) Patient Positioning: Upright in bed Baseline Vocal Quality: Normal Volitional Cough: Cognitively unable to elicit Volitional  Swallow: Unable to elicit    Oral/Motor/Sensory Function Overall Oral Motor/Sensory Function: Generalized oral weakness   Ice Chips Ice chips: Not tested   Thin Liquid Thin Liquid: Within functional limits Presentation: Straw;Cup    Nectar Thick Nectar Thick Liquid: Not tested   Honey Thick Honey Thick Liquid: Not tested   Puree Puree: Impaired Presentation: Self Fed;Spoon Other Comments: pt stuffs mouth and adds more before swallowing - total assist for small bites/sips   Solid   GO   Solid: Impaired Oral Phase Impairments: Reduced lingual movement/coordination Oral Phase Functional Implications: Other (comment);Impaired mastication Other Comments: pt stuffs mouth and adds more before swallowing - total assist for small bites/sips        Chales Abrahams 02/10/2018,12:31 PM   Donavan Burnet, MS [facility] SLP [phone]

## 2018-02-10 NOTE — Progress Notes (Signed)
CSW contacted patient's neighbor (Devin Clark [phone]) to discuss patient's discharge planning, no answer. CSW left voicemail requesting return phone call. Patient's neighbor is the only person listed on patient's emergency contacts. CSW will continue to try and reach patient's neighbor to complete assessment and discuss patient's discharge planning.  Celso SickleKimberly Rodney Wigger, [place] Clinical Social Worker [facility] Cell#: [phone]

## 2018-02-10 NOTE — Progress Notes (Signed)
PROGRESS NOTE  VENIAMIN KINCAID ZOX:[id number] DOB: 10-15-1920 DOA: 02/06/2018 PCP: Patient, No Pcp Per  HPI/Recap of past 71 hours: 84 year old male with history of dementia, CAD, A. fib, sinus node dysfunction status post pacemaker placement, hypothyroidism was brought to the ED after neighbor found patient on the floor at home, unsure how long patient was down.  Neighbor usually takes care of the patient.  Patient has had multiple falls per chart review.  In the ER CT head and x-ray pelvis negative for any acute fracture.  UA concerning for UTI.  Sodium noted to be around 147.  Patient admitted for further management and possible nursing home placement.  Today, pt noted to be more calm, denied any new complaints. No longer on restraints.  Assessment/Plan: Principal Problem:   Fall Active Problems:   Hypertension   Cardiac pacemaker   Coronary artery disease   Alzheimer's dementia   Atrial fibrillation (HCC)  History of multiple falls Likely due to dehydration/possible UTI/dementia CK level 614, now WNL CT head and x-ray pelvis negative for acute changes/fractures PT on board, rec SNF Social worker consulted for possible NH placement Fall precautions  UTI Afebrile with resolved leukocytosis UA with trace leukocytes, many bacteria, mucus present, WBC 6-10 UC > 100,000 E. Fecalis S/P IV ceftriaxone Start PO Augmentin for 7 days total  ??CAP CXR shows left basilar opacity Urine strep pneumo and Legionella negative Switch to PO Augmentin, continue azithromycin for 7 days total  Hypernatremia Resolved Likely due to dehydration  Elevated troponin/history of CAD Chest pain-free Flat trend, EKG showed paced rhythm No further intervention Discontinued simvastatin 80 mg daily due to mild elevated CKD and age  Chronic A. Fib/sinus node dysfunction Status post pacemaker Currently rate controlled Not on any anticoagulation due to fall risk  Hypothyroidism TSH 7.2, free  T4 0.74 Given advanced age, low BMI, will keep Synthroid at the same dose Continue Synthroid  Normocytic anemia/iron deficiency Hemoglobin at baseline Anemia panel showed iron 33, saturation 15, ferritin 387, folate 11.5, B12 639  Dementia with behavioral disturbances Started pt on risperidone 0.5mg  BID Assess the need for restrain daily Fall precautions  Malnutrition Dietitian consulted     Code Status: Full  Family Communication: None at bedside  Disposition Plan: SNF   Consultants:  None  Procedures:  None  Antimicrobials:  Augmentin  Azithromycin  DVT prophylaxis: Lovenox   Objective: Vitals:   02/09/18 1329 02/09/18 2046 02/10/18 0528 02/10/18 1520  BP: 135/73 125/86 128/81 (!) 144/76  Pulse: 65 62 63 (!) 56  Resp: 16 12 19 18   Temp: (!) 97.5 F (36.4 C) 98 F (36.7 C) 97.8 F (36.6 C) (!) 97.5 F (36.4 C)  TempSrc: Oral   Oral  SpO2: 95%  99% 100%  Weight:      Height:        Intake/Output Summary (Last 24 hours) at 02/10/2018 1819 Last data filed at 02/10/2018 8119 Gross per 24 hour  Intake 220 ml  Output 400 ml  Net -180 ml   Filed Weights   02/07/18 0450  Weight: 46.7 kg (102 lb 15.3 oz)    Exam:   General: NAD  Cardiovascular: S1, S2 present  Respiratory: CTAB  Abdomen: Soft, nontender, nondistended, bowel sounds present  Musculoskeletal: No pedal edema bilaterally  Skin: Normal  Psychiatry: Normal mood   Data Reviewed: CBC: Recent Labs  Lab 02/06/18 2333 02/07/18 0535 02/08/18 0526 02/09/18 0448  WBC 11.6* 12.7* 6.5 9.2  NEUTROABS 10.7*  --  5.7 7.7  HGB 12.5* 12.6* 12.7* 12.0*  HCT 38.9* 40.2 39.8 36.5*  MCV 95.1 95.7 94.1 91.5  PLT 346 382 292 353   Basic Metabolic Panel: Recent Labs  Lab 02/06/18 2333 02/07/18 0535 02/08/18 0526 02/09/18 0448  NA 146* 148* 142 136  K 3.9 3.7 4.0 3.5  CL 112* 114* 113* 105  CO2 27 25 24 22   GLUCOSE 136* 127* 104* 85  BUN 25* 24* 23* 21*  CREATININE 0.88  0.90 0.78 0.65  CALCIUM 8.8* 8.8* 8.2* 7.9*  MG  --  2.1  --   --    GFR: Estimated Creatinine Clearance: 37.3 mL/min (by C-G formula based on SCr of 0.65 mg/dL). Liver Function Tests: Recent Labs  Lab 02/06/18 2333  AST 30  ALT 16*  ALKPHOS 79  BILITOT 0.9  PROT 6.5  ALBUMIN 3.2*   No results for input(s): LIPASE, AMYLASE in the last 168 hours. Recent Labs  Lab 02/07/18 0535  AMMONIA 18   Coagulation Profile: No results for input(s): INR, PROTIME in the last 168 hours. Cardiac Enzymes: Recent Labs  Lab 02/07/18 0535 02/07/18 1152 02/07/18 1811 02/08/18 0526 02/09/18 0448  CKTOTAL 614*  --   --  474* 305  TROPONINI 0.07* 0.07* 0.10*  --   --    BNP (last 3 results) No results for input(s): PROBNP in the last 8760 hours. HbA1C: No results for input(s): HGBA1C in the last 72 hours. CBG: No results for input(s): GLUCAP in the last 168 hours. Lipid Profile: No results for input(s): CHOL, HDL, LDLCALC, TRIG, CHOLHDL, LDLDIRECT in the last 72 hours. Thyroid Function Tests: No results for input(s): TSH, T4TOTAL, FREET4, T3FREE, THYROIDAB in the last 72 hours. Anemia Panel: No results for input(s): VITAMINB12, FOLATE, FERRITIN, TIBC, IRON, RETICCTPCT in the last 72 hours. Urine analysis:    Component Value Date/Time   COLORURINE YELLOW 02/07/2018 0057   APPEARANCEUR CLEAR 02/07/2018 0057   LABSPEC 1.020 02/07/2018 0057   PHURINE 5.0 02/07/2018 0057   GLUCOSEU NEGATIVE 02/07/2018 0057   HGBUR MODERATE (A) 02/07/2018 0057   BILIRUBINUR NEGATIVE 02/07/2018 0057   KETONESUR 5 (A) 02/07/2018 0057   PROTEINUR 30 (A) 02/07/2018 0057   UROBILINOGEN 1.0 06/27/2014 1514   NITRITE NEGATIVE 02/07/2018 0057   LEUKOCYTESUR TRACE (A) 02/07/2018 0057   Sepsis Labs: @LABRCNTIP (procalcitonin:4,lacticidven:4)  ) Recent Results (from the past 240 hour(s))  Urine culture     Status: Abnormal   Collection Time: 02/07/18  1:37 AM  Result Value Ref Range Status   Specimen  Description   Final    URINE, RANDOM Performed at [facility], [address], [place], [place] [address]    Special Requests   Final    NONE Performed at [facility], [address], [place], [place] [address]    Culture >=100,000 COLONIES/mL ENTEROCOCCUS FAECALIS (A)  Final   Report Status 02/09/2018 FINAL  Final   Organism ID, Bacteria ENTEROCOCCUS FAECALIS (A)  Final      Susceptibility   Enterococcus faecalis - MIC*    AMPICILLIN <=2 SENSITIVE Sensitive     LEVOFLOXACIN 1 SENSITIVE Sensitive     NITROFURANTOIN <=16 SENSITIVE Sensitive     VANCOMYCIN 1 SENSITIVE Sensitive     * >=100,000 COLONIES/mL ENTEROCOCCUS FAECALIS      Studies: No results found.  Scheduled Meds: . amoxicillin-clavulanate  1 tablet Oral Q12H  . azithromycin  500 mg Oral Daily  . enoxaparin (LOVENOX) injection  30 mg  Subcutaneous Daily  . feeding supplement (ENSURE ENLIVE)  237 mL Oral TID BM  . fluticasone  1 spray Each Nare Daily  . levothyroxine  100 mcg Oral QAC breakfast  . multivitamin with minerals  1 tablet Oral Daily  . omega-3 acid ethyl esters  1 g Oral Daily  . risperiDONE  0.5 mg Oral BID  . vitamin B-12  1,000 mcg Oral Daily  . vitamin C  500 mg Oral Daily    Continuous Infusions:    LOS: 1 day     Briant CedarNkeiruka J Inmer Nix, MD [facility]  If 7PM-7AM, please contact night-coverage [email or web address] Password [email or web address] 02/10/2018, 6:19 PM

## 2018-02-11 MED ORDER — RISPERIDONE 0.25 MG PO TABS: 0.2500 mg | ORAL_TABLET | Freq: Two times a day (BID) | ORAL | Status: DC | PRN

## 2018-02-11 MED ORDER — RISPERIDONE 0.25 MG PO TABS
0.2500 mg | ORAL_TABLET | Freq: Two times a day (BID) | ORAL | Status: DC
Start: 2018-02-11 — End: 2018-02-12
  Administered 2018-02-11: 0.25 mg via ORAL
  Filled 2018-02-11 (×2): qty 1

## 2018-02-11 NOTE — Progress Notes (Signed)
PT Cancellation Note  Patient Details Name: Devin Clark MRN: [id number] DOB: 10-12-1920   Cancelled Treatment:     Artist Beachunabe to arouse pt enough to participate.  Pt has been evaluated with rec for SNF.    Felecia ShellingLori Judah Carchi  PTA [facility]  Acute  Rehab Pager      [phone]

## 2018-02-11 NOTE — Progress Notes (Signed)
PROGRESS NOTE  Devin Clark DOB: 10-28-1920 DOA: 02/06/2018 PCP: Patient, No Pcp Per  HPI/Recap of past 46 hours: 84 year old male with history of dementia, CAD, A. fib, sinus node dysfunction status post pacemaker placement, hypothyroidism was brought to the ED after neighbor found patient on the floor at home, unsure how long patient was down.  Neighbor usually takes care of the patient.  Patient has had multiple falls per chart review.  In the ER CT head and x-ray pelvis negative for any acute fracture.  UA concerning for UTI.  Sodium noted to be around 147.  Patient admitted for further management and possible nursing home placement.  Today, pt denies any new complaints, POA at bedside, wants pt to be placed in a NH or memory care. No longer requiring restraints   Assessment/Plan: Principal Problem:   Fall Active Problems:   Hypertension   Cardiac pacemaker   Coronary artery disease   Alzheimer's dementia   Atrial fibrillation (HCC)  History of multiple falls Likely due to dehydration/possible UTI/dementia CK level 614, now WNL CT head and x-ray pelvis negative for acute changes/fractures PT on board, rec SNF Social worker consulted for possible NH placement Fall precautions  UTI Afebrile with resolved leukocytosis UA with trace leukocytes, many bacteria, mucus present, WBC 6-10 UC > 100,000 E. Fecalis S/P IV ceftriaxone Start PO Augmentin for 7 days total  ??CAP CXR shows left basilar opacity Urine strep pneumo and Legionella negative Switch to PO Augmentin, continue azithromycin for 7 days total  Hypernatremia Resolved Likely due to dehydration  Elevated troponin/history of CAD Chest pain-free Flat trend, EKG showed paced rhythm No further intervention Discontinued simvastatin 80 mg daily due to mild elevated CPK and age  Chronic A. Fib/sinus node dysfunction Status post pacemaker Currently rate controlled Not on any anticoagulation due  to fall risk  Hypothyroidism TSH 7.2, free T4 0.74 Given advanced age, low BMI, will keep Synthroid at the same dose Continue Synthroid  Normocytic anemia/iron deficiency Hemoglobin at baseline Anemia panel showed iron 33, saturation 15, ferritin 387, folate 11.5, B12 639  Dementia with behavioral disturbances Started pt on risperidone 0.25mg  BID Fall precautions  Malnutrition Dietitian consulted     Code Status: Full  Family Communication: None at bedside  Disposition Plan: SNF   Consultants:  None  Procedures:  None  Antimicrobials:  Augmentin  Azithromycin  DVT prophylaxis: Lovenox   Objective: Vitals:   02/10/18 1520 02/10/18 2043 02/11/18 0433 02/11/18 1320  BP: (!) 144/76 131/74 140/78 (!) 114/58  Pulse: (!) 56 63 61 60  Resp: 18 18 20 18   Temp: (!) 97.5 F (36.4 C) 97.7 F (36.5 C) 97.6 F (36.4 C) (!) 97.4 F (36.3 C)  TempSrc: Oral Oral Oral Oral  SpO2: 100% 100%  99%  Weight:      Height:        Intake/Output Summary (Last 24 hours) at 02/11/2018 1707 Last data filed at 02/11/2018 1252 Gross per 24 hour  Intake 600 ml  Output 500 ml  Net 100 ml   Filed Weights   02/07/18 0450  Weight: 46.7 kg (102 lb 15.3 oz)    Exam:   General: NAD  Cardiovascular: S1, S2 present  Respiratory: CTAB  Abdomen: Soft, nontender, nondistended, bowel sounds present  Musculoskeletal: No pedal edema bilaterally  Skin: Normal  Psychiatry: Normal mood   Data Reviewed: CBC: Recent Labs  Lab 02/06/18 2333 02/07/18 0535 02/08/18 0526 02/09/18 0448  WBC 11.6* 12.7* 6.5 9.2  NEUTROABS 10.7*  --  5.7 7.7  HGB 12.5* 12.6* 12.7* 12.0*  HCT 38.9* 40.2 39.8 36.5*  MCV 95.1 95.7 94.1 91.5  PLT 346 382 292 353   Basic Metabolic Panel: Recent Labs  Lab 02/06/18 2333 02/07/18 0535 02/08/18 0526 02/09/18 0448  NA 146* 148* 142 136  K 3.9 3.7 4.0 3.5  CL 112* 114* 113* 105  CO2 27 25 24 22   GLUCOSE 136* 127* 104* 85  BUN 25* 24* 23*  21*  CREATININE 0.88 0.90 0.78 0.65  CALCIUM 8.8* 8.8* 8.2* 7.9*  MG  --  2.1  --   --    GFR: Estimated Creatinine Clearance: 37.3 mL/min (by C-G formula based on SCr of 0.65 mg/dL). Liver Function Tests: Recent Labs  Lab 02/06/18 2333  AST 30  ALT 16*  ALKPHOS 79  BILITOT 0.9  PROT 6.5  ALBUMIN 3.2*   No results for input(s): LIPASE, AMYLASE in the last 168 hours. Recent Labs  Lab 02/07/18 0535  AMMONIA 18   Coagulation Profile: No results for input(s): INR, PROTIME in the last 168 hours. Cardiac Enzymes: Recent Labs  Lab 02/07/18 0535 02/07/18 1152 02/07/18 1811 02/08/18 0526 02/09/18 0448  CKTOTAL 614*  --   --  474* 305  TROPONINI 0.07* 0.07* 0.10*  --   --    BNP (last 3 results) No results for input(s): PROBNP in the last 8760 hours. HbA1C: No results for input(s): HGBA1C in the last 72 hours. CBG: No results for input(s): GLUCAP in the last 168 hours. Lipid Profile: No results for input(s): CHOL, HDL, LDLCALC, TRIG, CHOLHDL, LDLDIRECT in the last 72 hours. Thyroid Function Tests: No results for input(s): TSH, T4TOTAL, FREET4, T3FREE, THYROIDAB in the last 72 hours. Anemia Panel: No results for input(s): VITAMINB12, FOLATE, FERRITIN, TIBC, IRON, RETICCTPCT in the last 72 hours. Urine analysis:    Component Value Date/Time   COLORURINE YELLOW 02/07/2018 0057   APPEARANCEUR CLEAR 02/07/2018 0057   LABSPEC 1.020 02/07/2018 0057   PHURINE 5.0 02/07/2018 0057   GLUCOSEU NEGATIVE 02/07/2018 0057   HGBUR MODERATE (A) 02/07/2018 0057   BILIRUBINUR NEGATIVE 02/07/2018 0057   KETONESUR 5 (A) 02/07/2018 0057   PROTEINUR 30 (A) 02/07/2018 0057   UROBILINOGEN 1.0 06/27/2014 1514   NITRITE NEGATIVE 02/07/2018 0057   LEUKOCYTESUR TRACE (A) 02/07/2018 0057   Sepsis Labs: @LABRCNTIP (procalcitonin:4,lacticidven:4)  ) Recent Results (from the past 240 hour(s))  Urine culture     Status: Abnormal   Collection Time: 02/07/18  1:37 AM  Result Value Ref Range  Status   Specimen Description   Final    URINE, RANDOM Performed at [facility], [address] [place], [place] [address]    Special Requests   Final    NONE Performed at [facility], [address] [place], [place] [address]    Culture >=100,000 COLONIES/mL ENTEROCOCCUS FAECALIS (A)  Final   Report Status 02/09/2018 FINAL  Final   Organism ID, Bacteria ENTEROCOCCUS FAECALIS (A)  Final      Susceptibility   Enterococcus faecalis - MIC*    AMPICILLIN <=2 SENSITIVE Sensitive     LEVOFLOXACIN 1 SENSITIVE Sensitive     NITROFURANTOIN <=16 SENSITIVE Sensitive     VANCOMYCIN 1 SENSITIVE Sensitive     * >=100,000 COLONIES/mL ENTEROCOCCUS FAECALIS      Studies: No results found.  Scheduled Meds: . amoxicillin-clavulanate  1 tablet Oral Q12H  . azithromycin  500 mg Oral Daily  . enoxaparin (  LOVENOX) injection  30 mg Subcutaneous Daily  . feeding supplement (ENSURE ENLIVE)  237 mL Oral TID BM  . fluticasone  1 spray Each Nare Daily  . levothyroxine  100 mcg Oral QAC breakfast  . multivitamin with minerals  1 tablet Oral Daily  . omega-3 acid ethyl esters  1 g Oral Daily  . risperiDONE  0.25 mg Oral BID  . vitamin B-12  1,000 mcg Oral Daily  . vitamin C  500 mg Oral Daily    Continuous Infusions:    LOS: 2 days     Briant Cedar, MD [facility]  If 7PM-7AM, please contact night-coverage [email or web address] Password [email or web address] 02/11/2018, 5:07 PM

## 2018-02-11 NOTE — Progress Notes (Signed)
[facility] CSW received phone call about pt from pt's neighbor, [employer] Bath [employer]ntanice Meadows. Antanice informed CSW that she is in the pt's room right now. CSW explained that [facility] covering [facility]. CSW provided resources over the phone. Pt's neighbor had questions about SNF. Pt's neighbor would like pt to go to a SNF that has long term and memory care as well. CSW explained that we can try to place him somewhere that meets those criteria, but it depends on bed availability. CSW explained that pt would be able to transfer from SNF to a long term nursing facility once his medicaid application is completed. Pt's neighbor is agreeable to that. Pt's neighbor stated that [facility] and would like to avoid pt going back there.   CSW provided list of SNFs with Memory Care to pt's neighbor.   Montine CircleKelsy Tangelia Sanson, Silverio LayLCSWA [facility]  [phone]

## 2018-02-12 ENCOUNTER — Encounter (HOSPITAL_COMMUNITY): Payer: Self-pay

## 2018-02-12 LAB — CBC WITH DIFFERENTIAL/PLATELET
BASOS ABS: 0 10*3/uL (ref 0.0–0.1)
BASOS PCT: 0 %
Eosinophils Absolute: 0.2 10*3/uL (ref 0.0–0.7)
Eosinophils Relative: 2 %
HEMATOCRIT: 35.7 % — AB (ref 39.0–52.0)
HEMOGLOBIN: 11.6 g/dL — AB (ref 13.0–17.0)
Lymphocytes Relative: 8 %
Lymphs Abs: 0.8 10*3/uL (ref 0.7–4.0)
MCH: 30.1 pg (ref 26.0–34.0)
MCHC: 32.5 g/dL (ref 30.0–36.0)
MCV: 92.7 fL (ref 78.0–100.0)
Monocytes Absolute: 0.7 10*3/uL (ref 0.1–1.0)
Monocytes Relative: 7 %
NEUTROS ABS: 8.2 10*3/uL — AB (ref 1.7–7.7)
NEUTROS PCT: 83 %
Platelets: 396 10*3/uL (ref 150–400)
RBC: 3.85 MIL/uL — AB (ref 4.22–5.81)
RDW: 14.4 % (ref 11.5–15.5)
WBC: 9.8 10*3/uL (ref 4.0–10.5)

## 2018-02-12 LAB — BASIC METABOLIC PANEL
ANION GAP: 6 (ref 5–15)
BUN: 44 mg/dL — ABNORMAL HIGH (ref 6–20)
CHLORIDE: 105 mmol/L (ref 101–111)
CO2: 28 mmol/L (ref 22–32)
Calcium: 8.6 mg/dL — ABNORMAL LOW (ref 8.9–10.3)
Creatinine, Ser: 0.97 mg/dL (ref 0.61–1.24)
GFR calc non Af Amer: >60 mL/min (ref >60)
Glucose, Bld: 107 mg/dL — ABNORMAL HIGH (ref 65–99)
POTASSIUM: 4.6 mmol/L (ref 3.5–5.1)
Sodium: 139 mmol/L (ref 135–145)

## 2018-02-12 MED ORDER — LEVOTHYROXINE SODIUM 100 MCG IV SOLR
50.0000 ug | Freq: Every day | INTRAVENOUS | Status: DC
  Administered 2018-02-13 – 2018-02-16 (×4): 50 ug via INTRAVENOUS
  Filled 2018-02-12 (×5): qty 5

## 2018-02-12 MED ORDER — SODIUM CHLORIDE 0.9 % IV SOLN
1.5000 g | Freq: Three times a day (TID) | INTRAVENOUS | Status: DC
Start: 2018-02-12 — End: 2018-02-14
  Administered 2018-02-12 – 2018-02-14 (×5): 1.5 g via INTRAVENOUS
  Filled 2018-02-12 (×6): qty 1.5

## 2018-02-12 NOTE — Progress Notes (Signed)
Pharmacy Antibiotic Note  [name] is a 84 y.o. male admitted on 02/06/2018 after being found down by his neighbor.  Urine culture (+) for enterococcus, tx with augmentin as well as azithromycin for CAP coverage.  Pharmacy has now been consulted for Unasyn dosing due to concern for aspiration pneumonia.  Day #6 total antibiotics Tmax 99.1 WBC wnl SCr 0.97, CrCl 30 ml/min  Plan: Unasyn 1.5g IV q8h Monitor renal function and adjust dosing interval if necessary  Height: 5\' 5"  (165.1 cm) Weight: 102 lb 15.3 oz (46.7 kg) IBW/kg (Calculated) : 61.5  Temp (24hrs), Avg:98.3 F (36.8 C), Min:97.8 F (36.6 C), Max:99.1 F (37.3 C)  Recent Labs  Lab 02/06/18 2333 02/07/18 0535 02/08/18 0526 02/09/18 0448 02/12/18 0458  WBC 11.6* 12.7* 6.5 9.2 9.8  CREATININE 0.88 0.90 0.78 0.65 0.97    Estimated Creatinine Clearance: 30.8 mL/min (by C-G formula based on SCr of 0.97 mg/dL).    No Known Allergies  Antimicrobials this admission:  Augmentin 6/3 >> 6/5 Azithromycin 5/31 >> 6/5 Ceftriaxone 5/31 >> 6/2 Unasyn 6/5 >>  Dose adjustments this admission:  None  Microbiology results:  5/31 UCx: >100K enterococcus faecalis, pan-sensitive   Thank you for allowing pharmacy to be a part of this patient's care.  Loralee PacasErin Adora Yeh, PharmD, BCPS Pager: [phone] 02/12/2018 4:22 PM

## 2018-02-12 NOTE — Clinical Social Work Note (Signed)
Clinical Social Work Assessment  Patient Details  Name: Devin Clark MRN: [id number] Date of Birth: July 13, 1921  Date of referral:  02/12/18               Reason for consult:  Facility Placement                Permission sought to share information with:    Permission granted to share information::     Name::        Agency::     Relationship::     Contact Information:     Housing/Transportation Living arrangements for the past 2 months:  Single Family Home Source of Information:  Devin RiverFriend/Neighbor(Devin Clark (Durable [place] [phone])) Patient Interpreter Needed:  None Criminal Activity/Legal Involvement Pertinent to Current Situation/Hospitalization:  No - Comment as needed Significant Relationships:  Friend Lives with:  Self Do you feel safe going back to the place where you live?  (PT recommending SNF) Need for family participation in patient care:  Yes (Comment)  Care giving concerns:  Patient from home alone. Patient's neighbor/HCPOA (Devin Carole BinningMeadows [phone]) reported that patient was independent with ADLs and used a walker prior to hospitalization. Patient's HCPOA reported that patient had a fall prior to admission. PT recommending SNF;24 hour supervision.    Social Worker assessment / plan:  Patient's neighbor is FirefighterDurable POA and paperwork on file, patient's neighbor reported that she also has HCPOA and that she will bring a copy to the hospital. CSW spoke with patient's neighbor regarding patient's discharge planning and PT recommendation for SNF;24 hour supervision, patient disoriented x 4 and unable to participate in assessment. CSW explained SNF placement process and insurance authorization, patient's neighbor/HCPOA verbalized understanding. Patient's neighbor reported that she put [facility] year and that patient was private paying for SNF. Patient's neighbor reported that she took [facility] because they had some issues  with patient's care. CSW and patient's neighbor discussed patient's lack of participation in PT and patient's ability to participate in ST rehab. CSW informed patient's neighbor that MD ordered a palliative consult. Patient's neighbor reported that she is available by phone during the day to speak with palliative of after work to come to the hospital to speak with palliative. Patient's friend reported that she is going to do a medicaid application for patient because patient is no longer able to private pay [facility]. CSW agreed to complete FL2 and follow up with patient's neighbor/HCPOA after palliative meeting to discuss discharge planning further.   CSW will complete patient's FL2 and follow up with bed offers after palliative meeting.    Employment status:  Retired Health and safety inspectornsurance information:  [employer] PT Recommendations:  Skilled Holiday representativeursing Facility, 24 Hour Supervision Information / Referral to community resources:  Skilled Nursing Facility  Patient/Family's Response to care:  Patient's Medical sales representativeneighbor/HCPOA appreciative of CSW assistance with discharge planning. Patient's neighbor verbalized plan for patient to dc to SNF for ST rehab if possible or to SNF for [facility] term care. Patient's neighbor agreeable to meeting with palliative to discuss discharge planning further.   Patient/Family's Understanding of and Emotional Response to Diagnosis, Current Treatment, and Prognosis:  Patient disoriented x 4 and unable to participate in assessment. Patient's neighbor/HCPOA involved in patient's care and verbalized understanding of current treatment plan. Palliative consulted to discuss goals of care, patient's neighbor agreeable.   Emotional Assessment Appearance:    Attitude/Demeanor/Rapport:  Unable to Assess Affect (typically observed):  Unable to  Assess Orientation:  (Disoriented x 4) Alcohol / Substance use:  Not Applicable Psych involvement (Current and /or in the community):  No  (Comment)  Discharge Needs  Concerns to be addressed:  Care Coordination Readmission within the last 30 days:  No Current discharge risk:  Lives alone, Dependent with Mobility Barriers to Discharge:  Continued Medical Work up   [employer], LCSW 02/12/2018, 3:05 PM

## 2018-02-12 NOTE — Progress Notes (Signed)
Nutrition Follow-up  DOCUMENTATION CODES:   Underweight  INTERVENTION:  - Continue Ensure Enlive TID. - Continue plan per SLP recommendation. - Will continue to monitor for POC/GOC and appropriate interventions and recommendations.   NUTRITION DIAGNOSIS:   Inadequate oral intake related to lethargy/confusion as evidenced by meal completion < 25%(Per RN). -mainly ongoing.  GOAL:   Patient will meet greater than or equal to 90% of their needs -unmet  MONITOR:   Supplement acceptance, PO intake, Labs, Weight trends, I & O's, Skin  ASSESSMENT:   84 y.o. M admitted on 02/06/18 for fall. PMH of dementia, CAD, a fib, sinus node dysfunction; pacemaker, hypothyroidism. Pt with hx of recent falls.   No new weight since admission. Patient remains disoriented x4. Per chart review, he consumed 50% of breakfast and 25% of lunch on 6/2; 25% of dinner on 6/3; 100% of both breakfast and lunch on 6/4. Diet was changed from Heart Healthy, thin liquids to Dysphagia 1, thin liquids on 6/3. Suspect that increased intake yesterday was related to improved tolerance and comfort while consuming PO.  SLP is following patient and last saw him this AM. Note reviewed in detail. Recommendation is for NPO versus liquids for comfort with acceptance of aspiration risk. Patient remains Full Code and Palliative Care has not seen patient this admission. Will continue to monitor for POC/GOC.    Medications reviewed; 100 mcg oral Synthroid/day, daily multivitamin with minerals, 1000 mcg oral vitamin B-12/day, 500 mg ascorbic acid/day.  Labs reviewed; BUN: 44 mg/dL, Ca: 8.6 mg/dL.     Diet Order:   Diet Order           DIET - DYS 1 Room service appropriate? Yes; Fluid consistency: Thin  Diet effective now          EDUCATION NEEDS:   Not appropriate for education at this time  Skin:  Skin Assessment: Reviewed RN Assessment  Last BM:  6/2  Height:   Ht Readings from Last 1 Encounters:  02/07/18 5\' 5"   (1.651 m)    Weight:   Wt Readings from Last 1 Encounters:  02/07/18 102 lb 15.3 oz (46.7 kg)    Ideal Body Weight:  61.81 kg  BMI:  Body mass index is 17.13 kg/m.  Estimated Nutritional Needs:   Kcal:  1450-1650 kcal  Protein:  70-85 grams   Fluid:  >/= 1.2 L or per MD      Trenton GammonJessica Trenden Hazelrigg, MS, RD, LDN, [facility] Inpatient Clinical Dietitian Pager # [phone] After hours/weekend pager # [phone]

## 2018-02-12 NOTE — Progress Notes (Addendum)
  Speech Language Pathology Treatment: Dysphagia  Patient Details Name: Devin Clark MRN: [id number] DOB: [birth year] Today's Date: 02/12/2018 Time: 0915-0930 SLP Time Calculation (min) (ACUTE ONLY): 15 min  Assessment / Plan / Recommendation Clinical Impression  Pt today with gurgly breathing quality at baseline that he does not clear despite max cues to cough.  Pt not following directions which will substantially increase his aspiration.  Pt denies respiratory issues at this time. NT reports pt with gurgly breathing associated with intake.  Suspicion pt is overtly aspirating po intake and secretions - regardless of dietary consistency.  SLP repositioned with NT and spoke to pt = after which he became lethargic and did not awaken with gentle verbal/tactile stimulation.    Given pt's advanced age, recent recurrent falls, worsening dysphagia = prognosis for swallow function to be functional is poor in this SLPs opinion. As pt is a full code - rec NPO - oral care and consideration for palliative referral to establish goals of care.  MBS would not change pt's care plan as anticipate will only diagnosis severe dysphagia with inadequate airway protection.    If desires diet with risks accepted - may want to change to liquids only for maximal comfort with aspiration - as pt is impulsive and had significant aspiration episode with solids two days prior.  Suspect significant pharyngeal weakness results in worsening residuals with increased viscocity of boluses.  Informed RN and NT - left message with case manager to relay to Md.    Will follow up for pt/family education -    HPI HPI: 84 yo male [facility] with falls - PMH + for dementia, Afib, HTN, UTI, sodium levels 147, CAD.  Pt found to have left basilar ATX - concerning for pna per imaging study.  Swallow eval completed two days prior and diet was changed to puree/thin.  Today pt seen to assess po tolerance.       SLP Plan  Other (Comment)        Recommendations  Diet recommendations: NPO(oral care/moisture)                Follow up Recommendations: None SLP Visit Diagnosis: Dysphagia, oropharyngeal phase (R13.12) Plan: Other (Comment)       GO               Donavan Burnetamara Jonnette Nuon, MS [facility] SLP [phone]  Chales AbrahamsKimball, Urvi Imes Ann 02/12/2018, 9:36 AM

## 2018-02-12 NOTE — Progress Notes (Signed)
PROGRESS NOTE  Devin Clark  VHQ:[id number] DOB: 1921-01-27 DOA: 02/06/2018 PCP: Patient, No Pcp Per   Brief Narrative: Devin Clark is a 84 y.o. male with a history of dementia, CAD, AFib, sinus node dysfunction s/p PPM, and hypothyroidism who was brought to the ED after being found down by his neighbor who is also HCPOA. Unknown duration down but has had multiple falls recently. Work up in ED included pelvis XR negative for fracture, UA concerning for UTI, mild hypernatremia, and elevated CPK later found to be mildly elevated. CT head at that time showed no acute changes with background of chronic microvascular ischemic changes and parenchymal volume loss of the brain. He was admitted and has struggled with delirium which has improved with risperidone, treated for UTI and possible CAP as well. He has exhibited worsening dysphagia. Initial plan was for placement at SNF for rehabilitation though the patient is unable to participate in PT at this time.   Assessment & Plan: Principal Problem:   Fall Active Problems:   Hypertension   Cardiac pacemaker   Coronary artery disease   Alzheimer's dementia   Atrial fibrillation (HCC)  Fall, dehydration, very mild rhabdomyolysis: With h/o multiple falls and dementia. No fractures/dislocations on radiographs at admission. CK trended to normal with rehydration.  - PT recommending SNF.  - Fall precautions, up with assistance.  E. faecalis UTI:  - s/p ceftriaxone, now Tx w/augmentin > unasyn (sensitive to amp)  Left base pneumonia: Strong suspicion for aspiration pneumonia. Urine strep pneumo and Legionella negative. - Pt has received ceftriaxone x4 days, azithromycin x5 days and augmentin starting 6/3.  - Will switch to unasyn given overt aspiration.   Dysphagia: Severe aspiration risk (likely aspirating secretions).  - Seems to have worsened during hospitalization and pt too lethargic this AM for full evaluation. I suspect this could be  due to risperidone. Will repeat CT head to r/o hemorrhage etc. if continues (would not be candidate for urgent intervention regardless). Could also be due to lethargy secondary to infections, delirium.  - If no CNS cause identified and pt not improving w/Tx of delirium, will need to discuss goals of care with HCPOA.   Hypernatremia: Hypovolemic, resolved w/IVF  Elevated troponin with history of CAD: No chest pain, ECG paced.  - No aspirin due to NPO - Flat trend reassuring. No aggressive interventions planned.  - Stopping statin due to mild rhabdo. Recommend discontinuing given age/dementia, pending further goals of care discussions.   Chronic A. Fib/sinus node dysfunction - Monitor  Hypothyroidism: TSH 7.2, free T4 0.74 - Given advanced age, low BMI, will keep Synthroid at the same dose (IV 1/2 dose while NPO)  Normocytic anemia/iron deficiency: Mild. Anemia panel showed iron 33, saturation 15, ferritin 387, folate 11.5, B12 639 - Monitor intermittently  Dementia with behavioral disturbances: and acute delirium which is improved.  - Stop risperidone (started as inpatient)  Malnutrition: Anticipated to be worsened by dysphagia.  - Dietitian consulted  DVT prophylaxis: Lovenox Code Status: Full Family Communication: None at bedside, will try to call later today. Disposition Plan: Uncertain  Consultants:   None  Procedures:   None  Antimicrobials:  CTX, azithromycin, augmentin and unasyn   Subjective: Pt lethargic but rousable, confused this AM. Denies pain.  Objective: Vitals:   02/11/18 1320 02/11/18 2043 02/12/18 0421 02/12/18 1325  BP: (!) 114/58 (!) 102/57 127/81 132/75  Pulse: 60 60 65 (!) 56  Resp: 18 20 18 18   Temp: (!) 97.4 F (36.3  C) 98.1 F (36.7 C) 97.8 F (36.6 C) 99.1 F (37.3 C)  TempSrc: Oral Oral Oral Oral  SpO2: 99%   95%  Weight:      Height:        Intake/Output Summary (Last 24 hours) at 02/12/2018 1553 Last data filed at 02/12/2018  1300 Gross per 24 hour  Intake 240 ml  Output 750 ml  Net -510 ml   Filed Weights   02/07/18 0450  Weight: 46.7 kg (102 lb 15.3 oz)    Gen: Elderly confused male in no distress Pulm: Non-labored breathing room air. Decreased at bases.  CV: Regular. No murmur, rub, or gallop. No JVD, no pedal edema. GI: Abdomen soft, non-tender, non-distended, with normoactive bowel sounds. No organomegaly or masses felt. Ext: Warm, no deformities Neuro: Drowsy but rousable, no aphasia but cognitive impairment is significant. Moving all extremities with no definite focal neurological deficits on exam limited by pt cooperation. Psych: Judgement and insight impaired. Mood & affect appropriate.   Data Reviewed: I have personally reviewed following labs and imaging studies  CBC: Recent Labs  Lab 02/06/18 2333 02/07/18 0535 02/08/18 0526 02/09/18 0448 02/12/18 0458  WBC 11.6* 12.7* 6.5 9.2 9.8  NEUTROABS 10.7*  --  5.7 7.7 8.2*  HGB 12.5* 12.6* 12.7* 12.0* 11.6*  HCT 38.9* 40.2 39.8 36.5* 35.7*  MCV 95.1 95.7 94.1 91.5 92.7  PLT 346 382 292 353 396   Basic Metabolic Panel: Recent Labs  Lab 02/06/18 2333 02/07/18 0535 02/08/18 0526 02/09/18 0448 02/12/18 0458  NA 146* 148* 142 136 139  K 3.9 3.7 4.0 3.5 4.6  CL 112* 114* 113* 105 105  CO2 27 25 24 22 28   GLUCOSE 136* 127* 104* 85 107*  BUN 25* 24* 23* 21* 44*  CREATININE 0.88 0.90 0.78 0.65 0.97  CALCIUM 8.8* 8.8* 8.2* 7.9* 8.6*  MG  --  2.1  --   --   --    GFR: Estimated Creatinine Clearance: 30.8 mL/min (by C-G formula based on SCr of 0.97 mg/dL). Liver Function Tests: Recent Labs  Lab 02/06/18 2333  AST 30  ALT 16*  ALKPHOS 79  BILITOT 0.9  PROT 6.5  ALBUMIN 3.2*   No results for input(s): LIPASE, AMYLASE in the last 168 hours. Recent Labs  Lab 02/07/18 0535  AMMONIA 18   Coagulation Profile: No results for input(s): INR, PROTIME in the last 168 hours. Cardiac Enzymes: Recent Labs  Lab 02/07/18 0535  02/07/18 1152 02/07/18 1811 02/08/18 0526 02/09/18 0448  CKTOTAL 614*  --   --  474* 305  TROPONINI 0.07* 0.07* 0.10*  --   --    BNP (last 3 results) No results for input(s): PROBNP in the last 8760 hours. HbA1C: No results for input(s): HGBA1C in the last 72 hours. CBG: No results for input(s): GLUCAP in the last 168 hours. Lipid Profile: No results for input(s): CHOL, HDL, LDLCALC, TRIG, CHOLHDL, LDLDIRECT in the last 72 hours. Thyroid Function Tests: No results for input(s): TSH, T4TOTAL, FREET4, T3FREE, THYROIDAB in the last 72 hours. Anemia Panel: No results for input(s): VITAMINB12, FOLATE, FERRITIN, TIBC, IRON, RETICCTPCT in the last 72 hours. Urine analysis:    Component Value Date/Time   COLORURINE YELLOW 02/07/2018 0057   APPEARANCEUR CLEAR 02/07/2018 0057   LABSPEC 1.020 02/07/2018 0057   PHURINE 5.0 02/07/2018 0057   GLUCOSEU NEGATIVE 02/07/2018 0057   HGBUR MODERATE (A) 02/07/2018 0057   BILIRUBINUR NEGATIVE 02/07/2018 0057   KETONESUR 5 (A) 02/07/2018  0057   PROTEINUR 30 (A) 02/07/2018 0057   UROBILINOGEN 1.0 06/27/2014 1514   NITRITE NEGATIVE 02/07/2018 0057   LEUKOCYTESUR TRACE (A) 02/07/2018 0057   Recent Results (from the past 240 hour(s))  Urine culture     Status: Abnormal   Collection Time: 02/07/18  1:37 AM  Result Value Ref Range Status   Specimen Description   Final    URINE, RANDOM Performed at [facility], [address] [place], [place] [address]    Special Requests   Final    NONE Performed at [facility], [address] [place], [place] [address]    Culture >=100,000 COLONIES/mL ENTEROCOCCUS FAECALIS (A)  Final   Report Status 02/09/2018 FINAL  Final   Organism ID, Bacteria ENTEROCOCCUS FAECALIS (A)  Final      Susceptibility   Enterococcus faecalis - MIC*    AMPICILLIN <=2 SENSITIVE Sensitive     LEVOFLOXACIN 1 SENSITIVE Sensitive     NITROFURANTOIN <=16 SENSITIVE Sensitive     VANCOMYCIN  1 SENSITIVE Sensitive     * >=100,000 COLONIES/mL ENTEROCOCCUS FAECALIS      Radiology Studies: No results found.  Scheduled Meds: . amoxicillin-clavulanate  1 tablet Oral Q12H  . azithromycin  500 mg Oral Daily  . enoxaparin (LOVENOX) injection  30 mg Subcutaneous Daily  . feeding supplement (ENSURE ENLIVE)  237 mL Oral TID BM  . fluticasone  1 spray Each Nare Daily  . levothyroxine  100 mcg Oral QAC breakfast  . multivitamin with minerals  1 tablet Oral Daily  . omega-3 acid ethyl esters  1 g Oral Daily  . risperiDONE  0.25 mg Oral BID  . vitamin B-12  1,000 mcg Oral Daily  . vitamin C  500 mg Oral Daily   Continuous Infusions:   LOS: 3 days   Time spent: 25 minutes.  Devin Nineyan B Jasten Guyette, MD [facility] [email or web address] Password [email or web address] 02/12/2018, 3:53 PM

## 2018-02-13 ENCOUNTER — Inpatient Hospital Stay (HOSPITAL_COMMUNITY): Payer: Medicare Other

## 2018-02-13 MED ORDER — DEXTROSE-NACL 5-0.45 % IV SOLN
INTRAVENOUS | Status: DC
  Administered 2018-02-13 – 2018-02-14 (×3): via INTRAVENOUS

## 2018-02-13 NOTE — Progress Notes (Signed)
  Speech Language Pathology Treatment: Dysphagia  Patient Details Name: Devin Clark MRN: [id number] DOB: February 18, 1921 Today's Date: 02/13/2018 Time: 1210-1230 SLP Time Calculation (min) (ACUTE ONLY): 20 min  Assessment / Plan / Recommendation Clinical Impression  Pt today sitting upright and fully alert.  Able to answer a few basic questions - but does not follow directions.  Voice clear at baseline but as soon as intake consumed - pt with delayed significant cough and expectoration of secretions/po.   He did not clinically tolerate one consistently better than another.    Pt admitted to issues with "bubbling up in throat" with all intake causing him to cough xfive years.  Suspect component of chronic aspiration that he has managed until acutely ill.  Also note pt with left facial asymmetry today - ? If thisis baseline?    Spoke to MD and MBS planned for this pm.  RN informed.  Recommend keep npo pending MBS.     HPI HPI: 84 [facility] male [facility] - PMH + for dementia, Afib, HTN, UTI, sodium levels 147, CAD.  Pt found to have left basilar ATX - concerning for pna per imaging study.  Swallow eval completed two days prior and diet was changed to puree/thin.  Today pt seen to assess po tolerance.       SLP Plan  MBS(this pm after 1300)       Recommendations  Diet recommendations: NPO Medication Administration: Via alternative means                Oral Care Recommendations: Oral care BID Follow up Recommendations: (tbd) SLP Visit Diagnosis: Dysphagia, unspecified (R13.10)(suspect pharyngo-cervical esophageal) Plan: MBS(this pm after 1300)       GO              Devin Burnetamara Trino Higinbotham, MS [facility] SLP [phone]   Chales AbrahamsKimball, Devin Waldren Ann 02/13/2018, 12:37 PM

## 2018-02-13 NOTE — Progress Notes (Signed)
PROGRESS NOTE  Devin Clark  ZOX:[id number] DOB: 10-07-1920 DOA: 02/06/2018 PCP: Patient, No Pcp Per   Brief Narrative: Devin Clark is a 84 y.o. male with a history of dementia, CAD, AFib, sinus node dysfunction s/p PPM, and hypothyroidism who was brought to the ED after being found down by his neighbor who is also HCPOA. Unknown duration down but has had multiple falls recently. Work up in ED included pelvis XR negative for fracture, UA concerning for UTI, mild hypernatremia, and elevated CPK later found to be mildly elevated. CT head at that time showed no acute changes with background of chronic microvascular ischemic changes and parenchymal volume loss of the brain. He was admitted and has struggled with delirium which has improved with risperidone, treated for UTI and possible CAP as well. He has exhibited worsening dysphagia. Initial plan was for placement at SNF for rehabilitation though the patient became lethargic and was unable to participate in PT, SLP evaluation. Risperidone was held and the patient's lethargy resolved. PT continues to recommend SNF and formal speech evaluation is pending.   Assessment & Plan: Principal Problem:   Fall Active Problems:   Hypertension   Cardiac pacemaker   Coronary artery disease   Alzheimer's dementia   Atrial fibrillation (HCC)  Fall, dehydration, very mild rhabdomyolysis: With h/o multiple falls and dementia. No fractures/dislocations on radiographs at admission. CK trended to normal with rehydration.  - PT recommending SNF.  - Fall precautions, up with assistance.  E. faecalis UTI:  - s/p ceftriaxone, now Tx w/augmentin > unasyn (sensitive to amp)  Left base pneumonia: Strong suspicion for aspiration pneumonia. Urine strep pneumo and Legionella negative. - Pt has received ceftriaxone x4 days, azithromycin x5 days and augmentin starting 6/3.  - Switched to [employer] given overt aspiration.   Dysphagia: Severe aspiration risk (likely  aspirating secretions). Though this is not an acute finding.  - Discussed with SLP therapist. Pt appears to have cough and gurgling with any po intake. Will proceed with formal evaluation.  - Repeat CT head as POA reports his mental status and swallowing ability has been decreased since this last fall "not like his other falls" and pt is not cooperative with exam. - Discussed goals of care with HCPOA. Pt has been a DNR, will update this in our system. He has also valued independence and would not "want to be a vegetable." We discussed the option of a PEG tube, though the patient has always loved food and would not want to stop eating even if he is grossly aspirating. Therefore I recommended against artificial feeding and she agrees. - BUN up, still NPO so will start maintenance IV fluids.  Hypernatremia: Hypovolemic, resolved w/IVF.  Elevated troponin with history of CAD: No chest pain, ECG paced.  - No aspirin due to NPO - Flat trend reassuring. No aggressive interventions planned.  - Stopping statin due to mild rhabdo. Recommend discontinuing given age/dementia, pending further goals of care discussions.   Chronic A. Fib/sinus node dysfunction - Monitor  Hypothyroidism: TSH 7.2, free T4 0.74 - Given advanced age, low BMI, will keep Synthroid at the same dose (IV 1/2 dose while NPO)  Normocytic anemia/iron deficiency: Mild. Anemia panel showed iron 33, saturation 15, ferritin 387, folate 11.5, B12 639 - Monitor intermittently  Dementia with behavioral disturbances: and acute delirium which is improved.  - Stopped risperidone (started as inpatient) with resolution of lethargy.  Malnutrition: Anticipated to be worsened by dysphagia.  - Dietitian consulted  DVT prophylaxis:  Lovenox Code Status: Full Family Communication: None at bedside, discussed with POA by phone at length. Disposition Plan: Uncertain  Consultants:   None  Procedures:   None  Antimicrobials:  CTX,  azithromycin, augmentin and unasyn   Subjective: No further lethargy, cleared overnight. Will not volunteer speech but when I examined legs he kicked at me and said "that hurt me" without aphasia. He is very confused. Has been asking persistently about eating.  Objective: Vitals:   02/12/18 1325 02/12/18 2014 02/13/18 0521 02/13/18 1227  BP: 132/75 114/69 117/64 108/69  Pulse: (!) 56 64 64 60  Resp: 18 18 18 17   Temp: 99.1 F (37.3 C) 99 F (37.2 C) (!) 97.5 F (36.4 C) (!) 97.3 F (36.3 C)  TempSrc: Oral Oral Oral Oral  SpO2: 95% 92% 93% 100%  Weight:      Height:        Intake/Output Summary (Last 24 hours) at 02/13/2018 1315 Last data filed at 02/13/2018 0600 Gross per 24 hour  Intake 200 ml  Output -  Net 200 ml   Filed Weights   02/07/18 0450  Weight: 46.7 kg (102 lb 15.3 oz)    Gen: Elderly confused male in no distress Pulm: Non-labored breathing room air. Decreased at bases.  CV: Regular. No murmur, rub, or gallop. No JVD, no pedal edema. GI: Abdomen soft, non-tender, non-distended, with normoactive bowel sounds. No organomegaly or masses felt. Ext: Warm, no deformities Neuro: Alert, confused. Moving both legs with 5/5 strength, no aphasia. Pt not cooperative with further CN or peripheral nerve exam. Psych: Judgement and insight impaired. Mood & affect appropriate.   Data Reviewed: I have personally reviewed following labs and imaging studies  CBC: Recent Labs  Lab 02/06/18 2333 02/07/18 0535 02/08/18 0526 02/09/18 0448 02/12/18 0458  WBC 11.6* 12.7* 6.5 9.2 9.8  NEUTROABS 10.7*  --  5.7 7.7 8.2*  HGB 12.5* 12.6* 12.7* 12.0* 11.6*  HCT 38.9* 40.2 39.8 36.5* 35.7*  MCV 95.1 95.7 94.1 91.5 92.7  PLT 346 382 292 353 396   Basic Metabolic Panel: Recent Labs  Lab 02/06/18 2333 02/07/18 0535 02/08/18 0526 02/09/18 0448 02/12/18 0458  NA 146* 148* 142 136 139  K 3.9 3.7 4.0 3.5 4.6  CL 112* 114* 113* 105 105  CO2 27 25 24 22 28   GLUCOSE 136* 127*  104* 85 107*  BUN 25* 24* 23* 21* 44*  CREATININE 0.88 0.90 0.78 0.65 0.97  CALCIUM 8.8* 8.8* 8.2* 7.9* 8.6*  MG  --  2.1  --   --   --    GFR: Estimated Creatinine Clearance: 30.8 mL/min (by C-G formula based on SCr of 0.97 mg/dL). Liver Function Tests: Recent Labs  Lab 02/06/18 2333  AST 30  ALT 16*  ALKPHOS 79  BILITOT 0.9  PROT 6.5  ALBUMIN 3.2*   No results for input(s): LIPASE, AMYLASE in the last 168 hours. Recent Labs  Lab 02/07/18 0535  AMMONIA 18   Coagulation Profile: No results for input(s): INR, PROTIME in the last 168 hours. Cardiac Enzymes: Recent Labs  Lab 02/07/18 0535 02/07/18 1152 02/07/18 1811 02/08/18 0526 02/09/18 0448  CKTOTAL 614*  --   --  474* 305  TROPONINI 0.07* 0.07* 0.10*  --   --    BNP (last 3 results) No results for input(s): PROBNP in the last 8760 hours. HbA1C: No results for input(s): HGBA1C in the last 72 hours. CBG: No results for input(s): GLUCAP in the last 168 hours.  Lipid Profile: No results for input(s): CHOL, HDL, LDLCALC, TRIG, CHOLHDL, LDLDIRECT in the last 72 hours. Thyroid Function Tests: No results for input(s): TSH, T4TOTAL, FREET4, T3FREE, THYROIDAB in the last 72 hours. Anemia Panel: No results for input(s): VITAMINB12, FOLATE, FERRITIN, TIBC, IRON, RETICCTPCT in the last 72 hours. Urine analysis:    Component Value Date/Time   COLORURINE YELLOW 02/07/2018 0057   APPEARANCEUR CLEAR 02/07/2018 0057   LABSPEC 1.020 02/07/2018 0057   PHURINE 5.0 02/07/2018 0057   GLUCOSEU NEGATIVE 02/07/2018 0057   HGBUR MODERATE (A) 02/07/2018 0057   BILIRUBINUR NEGATIVE 02/07/2018 0057   KETONESUR 5 (A) 02/07/2018 0057   PROTEINUR 30 (A) 02/07/2018 0057   UROBILINOGEN 1.0 06/27/2014 1514   NITRITE NEGATIVE 02/07/2018 0057   LEUKOCYTESUR TRACE (A) 02/07/2018 0057   Recent Results (from the past 240 hour(s))  Urine culture     Status: Abnormal   Collection Time: 02/07/18  1:37 AM  Result Value Ref Range Status    Specimen Description   Final    URINE, RANDOM Performed at [facility], [address] [place], [place] [address]    Special Requests   Final    NONE Performed at [facility], [address] [place], [place] [address]    Culture >=100,000 COLONIES/mL ENTEROCOCCUS FAECALIS (A)  Final   Report Status 02/09/2018 FINAL  Final   Organism ID, Bacteria ENTEROCOCCUS FAECALIS (A)  Final      Susceptibility   Enterococcus faecalis - MIC*    AMPICILLIN <=2 SENSITIVE Sensitive     LEVOFLOXACIN 1 SENSITIVE Sensitive     NITROFURANTOIN <=16 SENSITIVE Sensitive     VANCOMYCIN 1 SENSITIVE Sensitive     * >=100,000 COLONIES/mL ENTEROCOCCUS FAECALIS      Radiology Studies: No results found.  Scheduled Meds: . enoxaparin (LOVENOX) injection  30 mg Subcutaneous Daily  . fluticasone  1 spray Each Nare Daily  . levothyroxine  50 mcg Intravenous Daily   Continuous Infusions: . ampicillin-sulbactam (UNASYN) IV Stopped (02/13/18 1107)     LOS: 4 days   Time spent: 25 minutes.  Tyrone Nineyan B Laterica Matarazzo, MD [facility] [email or web address] Password [email or web address] 02/13/2018, 1:15 PM

## 2018-02-13 NOTE — [place] FL2 (Signed)
[place] MEDICAID FL2 LEVEL OF CARE SCREENING TOOL     IDENTIFICATION  Patient Name: Devin Clark Birthdate: [birth year] Sex: male Admission Date (Current Location): 02/06/2018  [facility] [place]   Facility and Address:  [facility],  [address], [place] [id number]      Provider Number: [id number]  Attending Physician Name and Address:  Tyrone NineGrunz, Ryan B, MD  Relative Name and Phone Number:       Current Level of Care: Hospital Recommended Level of Care: Skilled Nursing Facility Prior Approval Number:    Date Approved/Denied:   PASRR Number: [id number]  Discharge Plan: SNF    Current Diagnoses: Patient Active Problem List   Diagnosis Date Noted  . Fall 02/07/2018  . Fall at home   . Lumbar compression fracture (HCC) 06/26/2014  . Compression fracture 06/26/2014  . Atrial fibrillation (HCC) 11/26/2012  . Everlene Cunning term (current) use of anticoagulants 11/26/2012  . Alzheimer's dementia 07/05/2011  . Cerumen impaction 07/05/2011  . Hypertension 06/13/2011  . Supraspinatus tendonitis 06/13/2011  . Hyperlipidemia 06/13/2011  . Cardiac pacemaker 06/05/2011  . Perforated duodenal ulcer (HCC) 05/28/2011    Class: Status post  . Coronary artery disease 06/12/1997    Orientation RESPIRATION BLADDER Height & Weight     (Disoriented x 4)  Normal Incontinent Weight: 102 lb 15.3 oz (46.7 kg) Height:  5\' 5"  (165.1 cm)  BEHAVIORAL SYMPTOMS/MOOD NEUROLOGICAL BOWEL NUTRITION STATUS      Incontinent Diet(see dc summary)  AMBULATORY STATUS COMMUNICATION OF NEEDS Skin   Extensive Assist Verbally Normal                       Personal Care Assistance Level of Assistance  Bathing, Feeding, Dressing Bathing Assistance: Maximum assistance Feeding assistance: Maximum assistance Dressing Assistance: Maximum assistance     Functional Limitations Info  Hearing, Sight, Speech Sight Info: Adequate Hearing Info: Impaired Speech Info:  Adequate    SPECIAL CARE FACTORS FREQUENCY  PT (By licensed PT)     PT Frequency: Min 2x/week              Contractures      Additional Factors Info  Code Status, Allergies Code Status Info: Full Code Allergies Info: NKA           Current Medications (02/13/2018):  This is the current hospital active medication list Current Facility-Administered Medications  Medication Dose Route Frequency Provider Last Rate Last Dose  . acetaminophen (TYLENOL) tablet 650 mg  650 mg Oral Q6H PRN Eduard ClosKakrakandy, Arshad N, MD   650 mg at 02/09/18 [id number]   Or  . acetaminophen (TYLENOL) suppository 650 mg  650 mg Rectal Q6H PRN Eduard ClosKakrakandy, Arshad N, MD      . ampicillin-sulbactam (UNASYN) 1.5 g in sodium chloride 0.9 % 100 mL IVPB  1.5 g Intravenous Q8H Rollene FareWilliamson, Erin R, [facility]   Stopped at 02/13/18 [id number]  . enoxaparin (LOVENOX) injection 30 mg  30 mg Subcutaneous Daily Danford BadWofford, Drew A, RPH   30 mg at 02/12/18 1134  . fluticasone (FLONASE) 50 MCG/ACT nasal spray 1 spray  1 spray Each Nare Daily Briant CedarEzenduka, Nkeiruka J, MD   1 spray at 02/11/18 0930  . levothyroxine (SYNTHROID, LEVOTHROID) injection 50 mcg  50 mcg Intravenous Daily Tyrone NineGrunz, Ryan B, MD         Discharge Medications: Please see discharge summary for a list of discharge medications.  Relevant Imaging Results:  Relevant Lab Results:   Additional  Information SSN [id number]  Antionette Poles, LCSW

## 2018-02-13 NOTE — Progress Notes (Signed)
Modified Barium Swallow Progress Note  Patient Details  Name: Devin BruinsWilliam C Humphreys MRN: [id number] Date of Birth: 1920-12-18  Today's Date: 02/13/2018  Modified Barium Swallow completed.  Full report located under Chart Review in the Imaging Section.  Brief recommendations include the following:  Clinical Impression  Pt presents with mild oral and severe pharyngeal dysphagia with sensorimotor deficits. Delay in swallow reflex noted with liquids triggering at pyriform sinus.  Decreased epigllotic deflection/laryngeal elevation/closure results in vallecular more than pyriform sinus residuals .  Chin tuck posture did not improve airway protection.  Small single boluses of nectar were not aspirated but large sequential boluses were grossly aspirated.  Pt does not sense pharyngeal residuals and does not swallow on command to help decrease. Use of tracely filled and/or dry spoon to tongue did not elicit dry swallow.  Pt with lingual pumping with puree due to oral discoordination.  If pt is to eat - it should be with accepted aspiration risks.  Recommend dys3/nectar *allowing tsps thin water between meals* to help maximize comfort.    Swallow Evaluation Recommendations       SLP Diet Recommendations: NPO;Other (Comment)(with to have po with accepted risks, would recommend dys3/nectar for comfort )   Liquid Administration via: Cup   Medication Administration: Crushed with puree   Supervision: Full supervision/cueing for compensatory strategies   Compensations: Slow rate;Small sips/bites(small single sip)   Postural Changes: Seated upright at 90 degrees;Remain semi-upright after after feeds/meals (Comment)   Oral Care Recommendations: Oral care before and after PO   Other Recommendations: Order thickener from pharmacy   Donavan Burnetamara Marshun Duva, MS [facility] SLP [phone]  Chales AbrahamsKimball, Ashanty Coltrane Ann 02/13/2018,4:24 PM

## 2018-02-13 NOTE — Progress Notes (Signed)
Physical Therapy Treatment Patient Details Name: Devin Clark MRN: [id number] DOB: 1921-08-12 Today's Date: 02/13/2018    History of Present Illness 84 y.o. male with history of dementia, CAD status post stenting, atrial fibrillation, sinus node dysfunction status post pacemaker placement, hypothyroidism was brought to the ER after patient was found on the floor at home.    PT Comments     Pt admitted with above diagnosis. Pt currently with functional limitations due to the deficits listed below (see PT Problem List). Patient more alert today for session. Bed mobility with mod assist after bilateral mitts doffed. Transfers with mod assist. Patient incontinent of bowel during short distance ambulation. Transferred to recliner; chair alarm set. PT attempted to partially clean up patient. Nursing notified patient would need additional hygiene assistance. Pt will benefit from skilled PT to increase their independence and safety with mobility to allow discharge to the venue listed below.     Follow Up Recommendations  SNF;Supervision/Assistance - 24 hour     Equipment Recommendations  None recommended by PT    Recommendations for Other Services       Precautions / Restrictions Precautions Precautions: Fall Restrictions Weight Bearing Restrictions: No    Mobility  Bed Mobility Overal bed mobility: Needs Assistance Bed Mobility: Supine to Sit     Supine to sit: Mod assist;HOB elevated Sit to supine: Mod assist   General bed mobility comments: assist for legs off bed and to lift trunk with cues  Transfers Overall transfer level: Needs assistance Equipment used: Rolling walker (2 wheeled) Transfers: Sit to/from Stand Sit to Stand: Mod assist         General transfer comment: lifting assist from EOB   Ambulation/Gait Ambulation/Gait assistance: Mod assist Ambulation Distance (Feet): 6 Feet Assistive device: Rolling walker (2 wheeled) Gait Pattern/deviations: Decreased  stride length;Trunk flexed;Shuffle         Stairs             Wheelchair Mobility    Modified Rankin (Stroke Patients Only)       Balance Overall balance assessment: Needs assistance Sitting-balance support: Bilateral upper extremity supported;Feet supported Sitting balance-Leahy Scale: Poor Sitting balance - Comments: initially leaning right Postural control: Right lateral lean   Standing balance-Leahy Scale: Poor                              Cognition Arousal/Alertness: Awake/alert Behavior During Therapy: WFL for tasks assessed/performed Overall Cognitive Status: No family/caregiver present to determine baseline cognitive functioning                                        Exercises      General Comments General comments (skin integrity, edema, etc.): patient incontinent of bowel during ambulation; pt left in recliner; nursing informed pt needed to be cleaned up.      Pertinent Vitals/Pain Pain Assessment: No/denies pain    Home Living                      Prior Function            PT Goals (current goals can now be found in the care plan section) Progress towards PT goals: Progressing toward goals    Frequency    Min 2X/week      PT Plan Current plan remains appropriate  Co-evaluation              AM-PAC PT "6 Clicks" Daily Activity  Outcome Measure  Difficulty turning over in bed (including adjusting bedclothes, sheets and blankets)?: A Lot Difficulty moving from lying on back to sitting on the side of the bed? : A Lot Difficulty sitting down on and standing up from a chair with arms (e.g., wheelchair, bedside commode, etc,.)?: A Lot Help needed moving to and from a bed to chair (including a wheelchair)?: A Lot Help needed walking in hospital room?: A Lot Help needed climbing 3-5 steps with a railing? : Total 6 Click Score: 11    End of Session Equipment Utilized During Treatment: Gait  belt Activity Tolerance: Patient tolerated treatment well Patient left: with call bell/phone within reach;in chair;with chair alarm set(bilat hand mitts applied) Nurse Communication: Mobility status PT Visit Diagnosis: Other abnormalities of gait and mobility (R26.89);Other symptoms and signs involving the nervous system (R29.898)     Time: 0935-1000 PT Time Calculation (min) (ACUTE ONLY): 25 min  Charges:  $Therapeutic Activity: 8-22 mins                    G Codes:       Prabhnoor Ellenberger D. Hartnett-Rands, MS, PT [place] PT [facility] [phone] 02/13/2018, 10:45 AM

## 2018-02-14 LAB — BASIC METABOLIC PANEL
Anion gap: 6 (ref 5–15)
BUN: 20 mg/dL (ref 6–20)
CHLORIDE: 103 mmol/L (ref 101–111)
CO2: 28 mmol/L (ref 22–32)
CREATININE: 0.7 mg/dL (ref 0.61–1.24)
Calcium: 8.1 mg/dL — ABNORMAL LOW (ref 8.9–10.3)
GFR calc Af Amer: >60 mL/min (ref >60)
GFR calc non Af Amer: >60 mL/min (ref >60)
Glucose, Bld: 109 mg/dL — ABNORMAL HIGH (ref 65–99)
Potassium: 3.9 mmol/L (ref 3.5–5.1)
SODIUM: 137 mmol/L (ref 135–145)

## 2018-02-14 NOTE — Progress Notes (Addendum)
PROGRESS NOTE  AVON MERGENTHALER  ZOX:[id number] DOB: 29-Jun-1921 DOA: 02/06/2018 PCP: Patient, No Pcp Per   Brief Narrative: Devin Clark is a 84 y.o. male with a history of dementia, CAD, AFib, sinus node dysfunction s/p PPM, and hypothyroidism who was brought to the ED after being found down by his neighbor who is also HCPOA. Unknown duration down but has had multiple falls recently. Work up in ED included pelvis XR negative for fracture, UA concerning for UTI, mild hypernatremia, and elevated CPK later found to be mildly elevated. CT head at that time showed no acute changes with background of chronic microvascular ischemic changes and parenchymal volume loss of the brain. He was admitted and has struggled with delirium which has improved with risperidone, treated for UTI and possible CAP as well. He has exhibited worsening dysphagia. Initial plan was for placement at SNF for rehabilitation though the patient became lethargic and was unable to participate in PT, SLP evaluation. Risperidone was held and the patient's lethargy resolved. PT continues to recommend SNF and formal speech evaluation recommends nothing by mouth. Contacted patient's neighbor , POA and left him a voicemail  Assessment & Plan: Principal Problem:   Fall Active Problems:   Hypertension   Cardiac pacemaker   Coronary artery disease   Alzheimer's dementia   Atrial fibrillation (HCC)  Fall, dehydration, very mild rhabdomyolysis: With h/o multiple falls and dementia. No fractures/dislocations on radiographs at admission. CK trended to normal with rehydration.  - PT recommending SNF.  - Fall precautions, up with assistance.  E. faecalis UTI:  - s/p ceftriaxone, now Tx w/augmentin > unasyn (sensitive to amp)  Left base pneumonia: Strong suspicion for aspiration pneumonia. Urine strep pneumo and Legionella negative.completed treatment - Pt has received ceftriaxone x5  days, azithromycin x5 days and augmentin starting  6/3.  Patient is now completed treatment  Dysphagia: Severe aspiration risk (likely aspirating secretions). Though this is not an acute finding.  - Discussed with SLP therapist. Pt appears to have cough and gurgling with any po intake. Status post formal speech evaluation - Repeat CT head as POA reports his mental status and swallowing ability has been decreased since this last fall "not like his other falls" and pt is not cooperative with exam. - Discussed goals of care with HCPOA.  Patient currently a DO NOT RESUSCITATE. He has also valued independence and would not "want to be a vegetable." We discussed the option of a PEG tube, though the patient has always loved food and would not want to stop eating even if he is grossly aspirating. Dr. Jarvis Newcomer and POA decided against feeding tube Continue maintenance IV fluids Only other option is to start comfort feeding and POA agreeable, did this via telephone conversation on 6/7   Hypernatremia: Hypovolemic, resolved w/IVF.  Elevated troponin with history of CAD: No chest pain, ECG paced.  - No aspirin due to NPO - Flat trend reassuring. No aggressive interventions planned.  - Stopping statin due to mild rhabdo.    Chronic A. Fib/sinus node dysfunction - Monitor  Hypothyroidism: TSH 7.2, free T4 0.74 - Given advanced age, low BMI, will keep Synthroid at the same dose (IV 1/2 dose while NPO)  Normocytic anemia/iron deficiency: Mild. Anemia panel showed iron 33, saturation 15, ferritin 387, folate 11.5, B12 639 - Monitor intermittently  Dementia with behavioral disturbances: and acute delirium which is improved.  - Stopped risperidone (started as inpatient) with resolution of lethargy.  Malnutrition: Anticipated to be worsened by dysphagia.,  currently nothing by mouth    DVT prophylaxis: Lovenox Code Status: Full Family Communication: None at bedside, called POA and discussed as above Disposition Plan: barrier to discharge  nutritional status, anticipate discharge in 1-2 days, just starting a diet  Consultants:   None  Procedures:   None  Antimicrobials:  CTX, azithromycin, augmentin and unasyn   Subjective: Patient is very confused, he spread his urine all over his bed and on the floor, cannot have a meaningful conversation  Objective: Vitals:   02/13/18 0521 02/13/18 1227 02/13/18 2100 02/14/18 0652  BP: 117/64 108/69 105/65 101/62  Pulse: 64 60 63 65  Resp: 18 17 18 16   Temp: (!) 97.5 F (36.4 C) (!) 97.3 F (36.3 C) 97.6 F (36.4 C) (!) 97.5 F (36.4 C)  TempSrc: Oral Oral Axillary Axillary  SpO2: 93% 100% 94% 95%  Weight:      Height:        Intake/Output Summary (Last 24 hours) at 02/14/2018 1146 Last data filed at 02/13/2018 1600 Gross per 24 hour  Intake 113.75 ml  Output -  Net 113.75 ml   Filed Weights   02/07/18 0450  Weight: 46.7 kg (102 lb 15.3 oz)    Gen: Elderly confused male in no distress Pulm: Non-labored breathing room air. Decreased at bases.  CV: Regular. No murmur, rub, or gallop. No JVD, no pedal edema. GI: Abdomen soft, non-tender, non-distended, with normoactive bowel sounds. No organomegaly or masses felt. Ext: Warm, no deformities Neuro: Alert, confused. Moving both legs with 5/5 strength, no aphasia. Pt not cooperative with further CN or peripheral nerve exam. Psych: Judgement and insight impaired. Mood & affect appropriate.   Data Reviewed: I have personally reviewed following labs and imaging studies  CBC: Recent Labs  Lab 02/08/18 0526 02/09/18 0448 02/12/18 0458  WBC 6.5 9.2 9.8  NEUTROABS 5.7 7.7 8.2*  HGB 12.7* 12.0* 11.6*  HCT 39.8 36.5* 35.7*  MCV 94.1 91.5 92.7  PLT 292 353 396   Basic Metabolic Panel: Recent Labs  Lab 02/08/18 0526 02/09/18 0448 02/12/18 0458 02/14/18 0924  NA 142 136 139 137  K 4.0 3.5 4.6 3.9  CL 113* 105 105 103  CO2 24 22 28 28   GLUCOSE 104* 85 107* 109*  BUN 23* 21* 44* 20  CREATININE 0.78 0.65 0.97  0.70  CALCIUM 8.2* 7.9* 8.6* 8.1*   GFR: Estimated Creatinine Clearance: 37.3 mL/min (by C-G formula based on SCr of 0.7 mg/dL). Liver Function Tests: No results for input(s): AST, ALT, ALKPHOS, BILITOT, PROT, ALBUMIN in the last 168 hours. No results for input(s): LIPASE, AMYLASE in the last 168 hours. No results for input(s): AMMONIA in the last 168 hours. Coagulation Profile: No results for input(s): INR, PROTIME in the last 168 hours. Cardiac Enzymes: Recent Labs  Lab 02/07/18 1152 02/07/18 1811 02/08/18 0526 02/09/18 0448  CKTOTAL  --   --  474* 305  TROPONINI 0.07* 0.10*  --   --    BNP (last 3 results) No results for input(s): PROBNP in the last 8760 hours. HbA1C: No results for input(s): HGBA1C in the last 72 hours. CBG: No results for input(s): GLUCAP in the last 168 hours. Lipid Profile: No results for input(s): CHOL, HDL, LDLCALC, TRIG, CHOLHDL, LDLDIRECT in the last 72 hours. Thyroid Function Tests: No results for input(s): TSH, T4TOTAL, FREET4, T3FREE, THYROIDAB in the last 72 hours. Anemia Panel: No results for input(s): VITAMINB12, FOLATE, FERRITIN, TIBC, IRON, RETICCTPCT in the last 72 hours. Urine  analysis:    Component Value Date/Time   COLORURINE YELLOW 02/07/2018 0057   APPEARANCEUR CLEAR 02/07/2018 0057   LABSPEC 1.020 02/07/2018 0057   PHURINE 5.0 02/07/2018 0057   GLUCOSEU NEGATIVE 02/07/2018 0057   HGBUR MODERATE (A) 02/07/2018 0057   BILIRUBINUR NEGATIVE 02/07/2018 0057   KETONESUR 5 (A) 02/07/2018 0057   PROTEINUR 30 (A) 02/07/2018 0057   UROBILINOGEN 1.0 06/27/2014 1514   NITRITE NEGATIVE 02/07/2018 0057   LEUKOCYTESUR TRACE (A) 02/07/2018 0057   Recent Results (from the past 240 hour(s))  Urine culture     Status: Abnormal   Collection Time: 02/07/18  1:37 AM  Result Value Ref Range Status   Specimen Description   Final    URINE, RANDOM Performed at [facility], [address] [place], [place] [address]     Special Requests   Final    NONE Performed at [facility], [address] [place], [place] [address]    Culture >=100,000 COLONIES/mL ENTEROCOCCUS FAECALIS (A)  Final   Report Status 02/09/2018 FINAL  Final   Organism ID, Bacteria ENTEROCOCCUS FAECALIS (A)  Final      Susceptibility   Enterococcus faecalis - MIC*    AMPICILLIN <=2 SENSITIVE Sensitive     LEVOFLOXACIN 1 SENSITIVE Sensitive     NITROFURANTOIN <=16 SENSITIVE Sensitive     VANCOMYCIN 1 SENSITIVE Sensitive     * >=100,000 COLONIES/mL ENTEROCOCCUS FAECALIS      Radiology Studies: Ct Head Wo Contrast  Result Date: 02/13/2018 CLINICAL DATA:  Dementia, increased confusion today, altered level of consciousness EXAM: CT HEAD WITHOUT CONTRAST TECHNIQUE: Contiguous axial images were obtained from the base of the skull through the vertex without intravenous contrast. Sagittal and coronal MPR images reconstructed from axial data set. COMPARISON:  02/06/2018 FINDINGS: Brain: Generalized atrophy. Normal ventricular morphology. No midline shift or mass effect. Small vessel chronic ischemic changes of deep cerebral white matter. No intracranial hemorrhage, mass lesion, evidence of acute infarction, or extra-axial fluid collection. Vascular: Atherosclerotic calcification of internal carotid and vertebral arteries at skull base. No hyperdense vessels. Skull: Intact Sinuses/Orbits: Visualized paranasal sinuses and mastoid air cells clear Other: N/A IMPRESSION: Atrophy with small vessel chronic ischemic changes of deep cerebral white matter. No acute intracranial abnormalities. Electronically Signed   By: Ulyses SouthwardMark  Boles M.D.   On: 02/13/2018 15:08   Dg Swallowing Func-speech Pathology  Result Date: 02/13/2018 Objective Swallowing Evaluation: Type of Study: MBS-Modified Barium Swallow Study  Patient Details Name: Devin BruinsWilliam C Clark MRN: [id number] Date of Birth: 1921/03/21 Today's Date: 02/13/2018 Time: SLP Start Time (ACUTE ONLY): 1420  -SLP Stop Time (ACUTE ONLY): 1450 SLP Time Calculation (min) (ACUTE ONLY): 30 min Past Medical History: Past Medical History: Diagnosis Date . Atrial fibrillation (HCC)  . CAD (coronary artery disease)  . Compression fracture 2015 . Dyslipidemia  . Hypertension  . Hypothyroidism  . Sinus node dysfunction (HCC)   St.Jude pacemaker 06/05/2011 . Stented coronary artery 1998 Past Surgical History: Past Surgical History: Procedure Laterality Date . CARDIAC CATHETERIZATION  03/07/2000  100% CX,widely patent stent in RCA . CARDIAC CATHETERIZATION  03/26/2007  100% CX,widely patnet stent in RCA . CATARACT EXTRACTION, BILATERAL  2011 . CHEST TUBE INSERTION  06/06/11  right iatrogenic pneumothorax . CORONARY ANGIOPLASTY WITH STENT PLACEMENT  12/30/1996  stent RCA . NM MYOVIEW LTD  03/04/2007  High risk,mild-mod anterolateral/inferolateral ischemia . PACEMAKER INSERTION  06/05/11  St.Jude Accent DR . patch perforated duodenal ulcer  05/28/11  Cheree DittoGraham patch HPI:  84 yo male adm to [facility] with falls - PMH + for dementia, Afib, HTN, UTI, sodium levels 147, CAD.  Pt found to have left basilar ATX - concerning for pna per imaging study.  Swallow eval completed two days prior and diet was changed to puree/thin.  Today pt seen to assess po tolerance.  Subjective: pt awake in chair, talkative but disoriented Assessment / Plan / Recommendation CHL IP CLINICAL IMPRESSIONS 02/13/2018 Clinical Impression Pt presents with mild oral and severe pharyngeal dysphagia with sensorimotor deficits. Delay in swallow reflex noted with liquids triggering at pyriform sinus.  Decreased epigllotic deflection/laryngeal elevation/closure results in vallecular more than pyriform sinus residuals .  Chin tuck posture did not improve airway protection.  Small single boluses of nectar were not aspirated but large sequential boluses were grossly aspirated.  Pt does not sense pharyngeal residuals and does not swallow on command to help decrease. Use of tracely filled and/or  dry spoon to tongue did not elicit dry swallow.  Pt with lingual pumping with puree due to oral discoordination.  If pt is to eat - it should be with accepted aspiration risks.  Recommend dys3/nectar *allowing tsps thin water between meals* to help maximize comfort.  SLP Visit Diagnosis Dysphagia, oropharyngeal phase (R13.12);Dysphagia, pharyngoesophageal phase (R13.14) Attention and concentration deficit following -- Frontal lobe and executive function deficit following -- Impact on safety and function Severe aspiration risk;Risk for inadequate nutrition/hydration;Moderate aspiration risk   CHL IP TREATMENT RECOMMENDATION 02/13/2018 Treatment Recommendations Therapy as outlined in treatment plan below   Prognosis 02/13/2018 Prognosis for Safe Diet Advancement Guarded Barriers to Reach Goals Other (Comment);Time post onset;Severity of deficits;Cognitive deficits Barriers/Prognosis Comment -- CHL IP DIET RECOMMENDATION 02/13/2018 SLP Diet Recommendations NPO;Other (Comment) Liquid Administration via Cup Medication Administration Crushed with puree Compensations Slow rate;Small sips/bites Postural Changes Seated upright at 90 degrees;Remain semi-upright after after feeds/meals (Comment)   CHL IP OTHER RECOMMENDATIONS 02/13/2018 Recommended Consults -- Oral Care Recommendations Oral care before and after PO Other Recommendations Order thickener from pharmacy   CHL IP FOLLOW UP RECOMMENDATIONS 02/13/2018 Follow up Recommendations (No Data)   CHL IP FREQUENCY AND DURATION 02/13/2018 Speech Therapy Frequency (ACUTE ONLY) min 1 x/week Treatment Duration 1 week      CHL IP ORAL PHASE 02/13/2018 Oral Phase Impaired Oral - Pudding Teaspoon -- Oral - Pudding Cup -- Oral - Honey Teaspoon -- Oral - Honey Cup -- Oral - Nectar Teaspoon Premature spillage;Delayed oral transit Oral - Nectar Cup Delayed oral transit;Premature spillage Oral - Nectar Straw Delayed oral transit;Premature spillage Oral - Thin Teaspoon Delayed oral transit;Premature  spillage Oral - Thin Cup Delayed oral transit;Premature spillage Oral - Thin Straw Delayed oral transit;Premature spillage Oral - Puree Delayed oral transit;Premature spillage Oral - Mech Soft Delayed oral transit;Premature spillage Oral - Regular -- Oral - Multi-Consistency -- Oral - Pill -- Oral Phase - Comment --  CHL IP PHARYNGEAL PHASE 02/13/2018 Pharyngeal Phase Impaired Pharyngeal- Pudding Teaspoon -- Pharyngeal -- Pharyngeal- Pudding Cup -- Pharyngeal -- Pharyngeal- Honey Teaspoon -- Pharyngeal -- Pharyngeal- Honey Cup -- Pharyngeal -- Pharyngeal- Nectar Teaspoon Reduced pharyngeal peristalsis;Reduced epiglottic inversion;Reduced laryngeal elevation;Reduced airway/laryngeal closure;Delayed swallow initiation-pyriform sinuses;Pharyngeal residue - valleculae Pharyngeal -- Pharyngeal- Nectar Cup Delayed swallow initiation-pyriform sinuses;Delayed swallow initiation-vallecula;Reduced pharyngeal peristalsis;Reduced epiglottic inversion;Reduced laryngeal elevation;Reduced airway/laryngeal closure;Reduced tongue base retraction;Moderate aspiration;Penetration/Aspiration during swallow;Penetration/Apiration after swallow;Pharyngeal residue - valleculae Pharyngeal Material enters airway, passes BELOW cords and not ejected out despite cough attempt by patient;Material enters airway, passes BELOW cords without attempt by patient to  eject out (silent aspiration) Pharyngeal- Nectar Straw Reduced epiglottic inversion;Reduced laryngeal elevation;Reduced airway/laryngeal closure;Reduced tongue base retraction;Penetration/Aspiration during swallow;Penetration/Apiration after swallow Pharyngeal Material enters airway, passes BELOW cords without attempt by patient to eject out (silent aspiration) Pharyngeal- Thin Teaspoon Delayed swallow initiation-pyriform sinuses;Reduced airway/laryngeal closure;Reduced pharyngeal peristalsis;Reduced epiglottic inversion;Reduced tongue base retraction;Pharyngeal residue - valleculae;Pharyngeal  residue - pyriform Pharyngeal -- Pharyngeal- Thin Cup -- Pharyngeal -- Pharyngeal- Thin Straw Delayed swallow initiation-pyriform sinuses;Penetration/Aspiration during swallow;Penetration/Apiration after swallow;Reduced pharyngeal peristalsis;Reduced epiglottic inversion;Reduced laryngeal elevation;Reduced airway/laryngeal closure;Reduced tongue base retraction;Pharyngeal residue - valleculae;Pharyngeal residue - pyriform Pharyngeal Material enters airway, passes BELOW cords without attempt by patient to eject out (silent aspiration);Material enters airway, passes BELOW cords and not ejected out despite cough attempt by patient Pharyngeal- Puree Delayed swallow initiation-vallecula;Reduced pharyngeal peristalsis;Reduced epiglottic inversion;Reduced laryngeal elevation;Reduced airway/laryngeal closure;Reduced tongue base retraction;Pharyngeal residue - valleculae Pharyngeal -- Pharyngeal- Mechanical Soft Delayed swallow initiation-vallecula;Reduced airway/laryngeal closure;Reduced pharyngeal peristalsis;Reduced epiglottic inversion;Reduced laryngeal elevation;Reduced tongue base retraction Pharyngeal -- Pharyngeal- Regular -- Pharyngeal -- Pharyngeal- Multi-consistency -- Pharyngeal -- Pharyngeal- Pill -- Pharyngeal -- Pharyngeal Comment pt with secretion penetration/aspiration, chin tuck posture did not prevent aspiration  CHL IP CERVICAL ESOPHAGEAL PHASE 02/13/2018 Cervical Esophageal Phase Impaired Pudding Teaspoon -- Pudding Cup -- Honey Teaspoon -- Honey Cup -- Nectar Teaspoon -- Nectar Cup -- Nectar Straw -- Thin Teaspoon -- Thin Cup -- Thin Straw -- Puree -- Mechanical Soft -- Regular -- Multi-consistency -- Pill -- Cervical Esophageal Comment appearance of prominent cricopharyngeus observed with liquids, did not impair barium flow No flowsheet data found. Chales Abrahams 02/13/2018, 4:24 PM  Donavan Burnet, MS [facility] SLP [phone]              Scheduled Meds: . enoxaparin (LOVENOX) injection  30 mg  Subcutaneous Daily  . fluticasone  1 spray Each Nare Daily  . levothyroxine  50 mcg Intravenous Daily   Continuous Infusions: . dextrose 5 % and 0.45% NaCl 75 mL/hr at 02/13/18 2343     LOS: 5 days   Time spent: 25 minutes.  Richarda Overlie, MD [facility] [email or web address] Password [email or web address] 02/14/2018, 11:46 AM

## 2018-02-15 DIAGNOSIS — I482 Chronic atrial fibrillation: Secondary | ICD-10-CM

## 2018-02-15 NOTE — Progress Notes (Signed)
[facility] PROGRESS NOTE    Progress Note  Devin Clark  RUE:[id number] DOB: 1920/10/08 DOA: 02/06/2018 PCP: Patient, No Pcp Per     Brief Narrative:   Devin Clark is an 84 y.o. male past medical history of dementia, chronic atrial fibrillation sinus node dysfunction status post pacemaker hypothyroidism brought into the ED she was found down on the floor by her healthcare power of attorney, no duration of times but healthcare power of attorney relates that she has had multiple recent falls work-up in the ED was negative for fracture UA was concerning for UTI, with basic metabolic panel showing mild hyponatremia with a mildly elevated CK.  Assessment/Plan:   History of fall: History of multiple falls no fractures or dislocation. Evaluated the patient the recommended skilled nursing facility.  Enterococcus faecalis UTI: Status post Rocephin now on Augmentin  Left base similar pneumonia: Suspicious for aspiration antigens are negative Patient has completed a course of antibiotics.  Severe Dysphagia: Occupational therapy was consulted and recommended a dysphagia 3 diet nectar thick liquid with close monitor for aspiration as she has severe risk of aspirating. Patient is a DNR, due to her high risk of aspiration the POA was agreeable to comfort feeds over the phone conversation on 02/14/2018. Comfort feeds, unable to contact healthcare power of attorney on 02/15/2018 will start working with skilled nursing facility placement.  Hypovolemic hypernatremia: Resolved with IV fluid hydration.  Chronic atrial fibrillation/sinus node dysfunction: Stable continue to monitor.  Hypothyroidism: Continue Synthroid.  Iron deficiency anemia: Continue to monitor closely hemoglobin stable.   DVT prophylaxis: lovenox Family Communication:none Disposition Plan/Barrier to D/C: SNF in am Code Status:     Code Status Orders  (From admission, onward)        Start     Ordered   02/13/18 1351  Do not attempt resuscitation (DNR)  Continuous    Question Answer Comment  In the event of cardiac or respiratory ARREST Do not call a "code blue"   In the event of cardiac or respiratory ARREST Do not perform Intubation, CPR, defibrillation or ACLS   In the event of cardiac or respiratory ARREST Use medication by any route, position, wound care, and other measures to relive pain and suffering. May use oxygen, suction and manual treatment of airway obstruction as needed for comfort.      02/13/18 1350    Code Status History    Date Active Date Inactive Code Status Order ID Comments User Context   02/07/2018 0340 02/13/2018 1350 Full Code [id number]  Eduard Clos, MD ED   06/26/2014 0244 06/29/2014 1637 Full Code [id number]  Pearson Grippe, MD Inpatient        IV Access:    Peripheral IV   Procedures and diagnostic studies:   Ct Head Wo Contrast  Result Date: 02/13/2018 CLINICAL DATA:  Dementia, increased confusion today, altered level of consciousness EXAM: CT HEAD WITHOUT CONTRAST TECHNIQUE: Contiguous axial images were obtained from the base of the skull through the vertex without intravenous contrast. Sagittal and coronal MPR images reconstructed from axial data set. COMPARISON:  02/06/2018 FINDINGS: Brain: Generalized atrophy. Normal ventricular morphology. No midline shift or mass effect. Small vessel chronic ischemic changes of deep cerebral white matter. No intracranial hemorrhage, mass lesion, evidence of acute infarction, or extra-axial fluid collection. Vascular: Atherosclerotic calcification of internal carotid and vertebral arteries at skull base. No hyperdense vessels. Skull: Intact Sinuses/Orbits: Visualized paranasal sinuses and mastoid air cells clear Other: N/A IMPRESSION: Atrophy with small  vessel chronic ischemic changes of deep cerebral white matter. No acute intracranial abnormalities. Electronically Signed   By: Ulyses Southward M.D.   On: 02/13/2018 15:08    Dg Swallowing Func-speech Pathology  Result Date: 02/13/2018 Objective Swallowing Evaluation: Type of Study: MBS-Modified Barium Swallow Study  Patient Details Name: Devin Clark MRN: [id number] Date of Birth: Dec 17, 1920 Today's Date: 02/13/2018 Time: SLP Start Time (ACUTE ONLY): 1420 -SLP Stop Time (ACUTE ONLY): 1450 SLP Time Calculation (min) (ACUTE ONLY): 30 min Past Medical History: Past Medical History: Diagnosis Date . Atrial fibrillation (HCC)  . CAD (coronary artery disease)  . Compression fracture 2015 . Dyslipidemia  . Hypertension  . Hypothyroidism  . Sinus node dysfunction (HCC)   St.Jude pacemaker 06/05/2011 . Stented coronary artery 1998 Past Surgical History: Past Surgical History: Procedure Laterality Date . CARDIAC CATHETERIZATION  03/07/2000  100% CX,widely patent stent in RCA . CARDIAC CATHETERIZATION  03/26/2007  100% CX,widely patnet stent in RCA . CATARACT EXTRACTION, BILATERAL  2011 . CHEST TUBE INSERTION  06/06/11  right iatrogenic pneumothorax . CORONARY ANGIOPLASTY WITH STENT PLACEMENT  12/30/1996  stent RCA . NM MYOVIEW LTD  03/04/2007  High risk,mild-mod anterolateral/inferolateral ischemia . PACEMAKER INSERTION  06/05/11  St.Jude Accent DR . patch perforated duodenal ulcer  05/28/11  [place]ree Ditto patch HPI: 84 yo male adm [facility] with falls - PMH + for dementia, Afib, HTN, UTI, sodium levels 147, CAD.  Pt found to have left basilar ATX - concerning for pna per imaging study.  Swallow eval completed two days prior and diet was changed to puree/thin.  Today pt seen to assess po tolerance.  Subjective: pt awake in chair, talkative but disoriented Assessment / Plan / Recommendation CHL IP CLINICAL IMPRESSIONS 02/13/2018 Clinical Impression Pt presents with mild oral and severe pharyngeal dysphagia with sensorimotor deficits. Delay in swallow reflex noted with liquids triggering at pyriform sinus.  Decreased epigllotic deflection/laryngeal elevation/closure results in vallecular more than pyriform sinus  residuals .  Chin tuck posture did not improve airway protection.  Small single boluses of nectar were not aspirated but large sequential boluses were grossly aspirated.  Pt does not sense pharyngeal residuals and does not swallow on command to help decrease. Use of tracely filled and/or dry spoon to tongue did not elicit dry swallow.  Pt with lingual pumping with puree due to oral discoordination.  If pt is to eat - it should be with accepted aspiration risks.  Recommend dys3/nectar *allowing tsps thin water between meals* to help maximize comfort.  SLP Visit Diagnosis Dysphagia, oropharyngeal phase (R13.12);Dysphagia, pharyngoesophageal phase (R13.14) Attention and concentration deficit following -- Frontal lobe and executive function deficit following -- Impact on safety and function Severe aspiration risk;Risk for inadequate nutrition/hydration;Moderate aspiration risk   CHL IP TREATMENT RECOMMENDATION 02/13/2018 Treatment Recommendations Therapy as outlined in treatment plan below   Prognosis 02/13/2018 Prognosis for Safe Diet Advancement Guarded Barriers to Reach Goals Other (Comment);Time post onset;Severity of deficits;Cognitive deficits Barriers/Prognosis Comment -- CHL IP DIET RECOMMENDATION 02/13/2018 SLP Diet Recommendations NPO;Other (Comment) Liquid Administration via Cup Medication Administration Crushed with puree Compensations Slow rate;Small sips/bites Postural Changes Seated upright at 90 degrees;Remain semi-upright after after feeds/meals (Comment)   CHL IP OTHER RECOMMENDATIONS 02/13/2018 Recommended Consults -- Oral Care Recommendations Oral care before and after PO Other Recommendations Order thickener from pharmacy   CHL IP FOLLOW UP RECOMMENDATIONS 02/13/2018 Follow up Recommendations (No Data)   CHL IP FREQUENCY AND DURATION 02/13/2018 Speech Therapy Frequency (ACUTE ONLY) min 1 x/week Treatment Duration  1 week      CHL IP ORAL PHASE 02/13/2018 Oral Phase Impaired Oral - Pudding Teaspoon -- Oral -  Pudding Cup -- Oral - Honey Teaspoon -- Oral - Honey Cup -- Oral - Nectar Teaspoon Premature spillage;Delayed oral transit Oral - Nectar Cup Delayed oral transit;Premature spillage Oral - Nectar Straw Delayed oral transit;Premature spillage Oral - Thin Teaspoon Delayed oral transit;Premature spillage Oral - Thin Cup Delayed oral transit;Premature spillage Oral - Thin Straw Delayed oral transit;Premature spillage Oral - Puree Delayed oral transit;Premature spillage Oral - Mech Soft Delayed oral transit;Premature spillage Oral - Regular -- Oral - Multi-Consistency -- Oral - Pill -- Oral Phase - Comment --  CHL IP PHARYNGEAL PHASE 02/13/2018 Pharyngeal Phase Impaired Pharyngeal- Pudding Teaspoon -- Pharyngeal -- Pharyngeal- Pudding Cup -- Pharyngeal -- Pharyngeal- Honey Teaspoon -- Pharyngeal -- Pharyngeal- Honey Cup -- Pharyngeal -- Pharyngeal- Nectar Teaspoon Reduced pharyngeal peristalsis;Reduced epiglottic inversion;Reduced laryngeal elevation;Reduced airway/laryngeal closure;Delayed swallow initiation-pyriform sinuses;Pharyngeal residue - valleculae Pharyngeal -- Pharyngeal- Nectar Cup Delayed swallow initiation-pyriform sinuses;Delayed swallow initiation-vallecula;Reduced pharyngeal peristalsis;Reduced epiglottic inversion;Reduced laryngeal elevation;Reduced airway/laryngeal closure;Reduced tongue base retraction;Moderate aspiration;Penetration/Aspiration during swallow;Penetration/Apiration after swallow;Pharyngeal residue - valleculae Pharyngeal Material enters airway, passes BELOW cords and not ejected out despite cough attempt by patient;Material enters airway, passes BELOW cords without attempt by patient to eject out (silent aspiration) Pharyngeal- Nectar Straw Reduced epiglottic inversion;Reduced laryngeal elevation;Reduced airway/laryngeal closure;Reduced tongue base retraction;Penetration/Aspiration during swallow;Penetration/Apiration after swallow Pharyngeal Material enters airway, passes BELOW cords  without attempt by patient to eject out (silent aspiration) Pharyngeal- Thin Teaspoon Delayed swallow initiation-pyriform sinuses;Reduced airway/laryngeal closure;Reduced pharyngeal peristalsis;Reduced epiglottic inversion;Reduced tongue base retraction;Pharyngeal residue - valleculae;Pharyngeal residue - pyriform Pharyngeal -- Pharyngeal- Thin Cup -- Pharyngeal -- Pharyngeal- Thin Straw Delayed swallow initiation-pyriform sinuses;Penetration/Aspiration during swallow;Penetration/Apiration after swallow;Reduced pharyngeal peristalsis;Reduced epiglottic inversion;Reduced laryngeal elevation;Reduced airway/laryngeal closure;Reduced tongue base retraction;Pharyngeal residue - valleculae;Pharyngeal residue - pyriform Pharyngeal Material enters airway, passes BELOW cords without attempt by patient to eject out (silent aspiration);Material enters airway, passes BELOW cords and not ejected out despite cough attempt by patient Pharyngeal- Puree Delayed swallow initiation-vallecula;Reduced pharyngeal peristalsis;Reduced epiglottic inversion;Reduced laryngeal elevation;Reduced airway/laryngeal closure;Reduced tongue base retraction;Pharyngeal residue - valleculae Pharyngeal -- Pharyngeal- Mechanical Soft Delayed swallow initiation-vallecula;Reduced airway/laryngeal closure;Reduced pharyngeal peristalsis;Reduced epiglottic inversion;Reduced laryngeal elevation;Reduced tongue base retraction Pharyngeal -- Pharyngeal- Regular -- Pharyngeal -- Pharyngeal- Multi-consistency -- Pharyngeal -- Pharyngeal- Pill -- Pharyngeal -- Pharyngeal Comment pt with secretion penetration/aspiration, chin tuck posture did not prevent aspiration  CHL IP CERVICAL ESOPHAGEAL PHASE 02/13/2018 Cervical Esophageal Phase Impaired Pudding Teaspoon -- Pudding Cup -- Honey Teaspoon -- Honey Cup -- Nectar Teaspoon -- Nectar Cup -- Nectar Straw -- Thin Teaspoon -- Thin Cup -- Thin Straw -- Puree -- Mechanical Soft -- Regular -- Multi-consistency -- Pill --  Cervical Esophageal Comment appearance of prominent cricopharyngeus observed with liquids, did not impair barium flow No flowsheet data found. Chales AbrahamsKimball, Tamara Ann 02/13/2018, 4:24 PM  Donavan Burnetamara Kimball, MS [facility] S[facility] [phone]               Medical Consultants:    None.  Anti-Infectives:   Augmentin  Subjective:    Devin GarbeWilliam C Clark and verbal.  Objective:    Vitals:   02/13/18 2100 02/14/18 0652 02/14/18 1310 02/14/18 2010  BP: 105/65 101/62 (!) 145/74 (!) 156/76  Pulse: 63 65 (!) 55 64  Resp: 18 16 16 18   Temp:  (!) 97.5 F (36.4 C) (!) 97.4 F (36.3 C) 98.2 F (36.8 C)  TempSrc: Axillary Axillary Axillary Oral  SpO2: 94%  95% 99% 98%  Weight:      Height:        Intake/Output Summary (Last 24 hours) at 02/15/2018 1007 Last data filed at 02/15/2018 0500 Gross per 24 hour  Intake 2775 ml  Output -  Net 2775 ml   Filed Weights   02/07/18 0450  Weight: 46.7 kg (102 lb 15.3 oz)    Exam: General exam: In no acute distress. Respiratory system: Good air movement and clear to auscultation. Cardiovascular system: S1 & S2 heard, RRR.  Gastrointestinal system: Abdomen is nondistended, soft and nontender.  Central nervous system: Alert and oriented. No focal neurological deficits. Extremities: No pedal edema. Skin: No rashes, lesions or ulcers Psychiatry: Judgement and insight appear normal. Mood & affect appropriate.    Data Reviewed:    Labs: Basic Metabolic Panel: Recent Labs  Lab 02/09/18 0448 02/12/18 0458 02/14/18 0924  NA 136 139 137  K 3.5 4.6 3.9  CL 105 105 103  CO2 22 28 28   GLUCOSE 85 107* 109*  BUN 21* 44* 20  CREATININE 0.65 0.97 0.70  CALCIUM 7.9* 8.6* 8.1*   GFR Estimated Creatinine Clearance: 37.3 mL/min (by C-G formula based on SCr of 0.7 mg/dL). Liver Function Tests: No results for input(s): AST, ALT, ALKPHOS, BILITOT, PROT, ALBUMIN in the last 168 hours. No results for input(s): LIPASE, AMYLASE in the last 168 hours. No results for  input(s): AMMONIA in the last 168 hours. Coagulation profile No results for input(s): INR, PROTIME in the last 168 hours.  CBC: Recent Labs  Lab 02/09/18 0448 02/12/18 0458  WBC 9.2 9.8  NEUTROABS 7.7 8.2*  HGB 12.0* 11.6*  HCT 36.5* 35.7*  MCV 91.5 92.7  PLT 353 396   Cardiac Enzymes: Recent Labs  Lab 02/09/18 0448  CKTOTAL 305   BNP (last 3 results) No results for input(s): PROBNP in the last 8760 hours. CBG: No results for input(s): GLUCAP in the last 168 hours. D-Dimer: No results for input(s): DDIMER in the last 72 hours. Hgb A1c: No results for input(s): HGBA1C in the last 72 hours. Lipid Profile: No results for input(s): CHOL, HDL, LDLCALC, TRIG, CHOLHDL, LDLDIRECT in the last 72 hours. Thyroid function studies: No results for input(s): TSH, T4TOTAL, T3FREE, THYROIDAB in the last 72 hours.  Invalid input(s): FREET3 Anemia work up: No results for input(s): VITAMINB12, FOLATE, FERRITIN, TIBC, IRON, RETICCTPCT in the last 72 hours. Sepsis Labs: Recent Labs  Lab 02/09/18 0448 02/12/18 0458  WBC 9.2 9.8   Microbiology Recent Results (from the past 240 hour(s))  Urine culture     Status: Abnormal   Collection Time: 02/07/18  1:37 AM  Result Value Ref Range Status   Specimen Description   Final    URINE, RANDOM Performed at [facility], [address] [place], [place] [address]    Special Requests   Final    NONE Performed at [facility], [address] [place], [place] [address]    Culture >=100,000 COLONIES/mL ENTEROCOCCUS FAECALIS (A)  Final   Report Status 02/09/2018 FINAL  Final   Organism ID, Bacteria ENTEROCOCCUS FAECALIS (A)  Final      Susceptibility   Enterococcus faecalis - MIC*    AMPICILLIN <=2 SENSITIVE Sensitive     LEVOFLOXACIN 1 SENSITIVE Sensitive     NITROFURANTOIN <=16 SENSITIVE Sensitive     VANCOMYCIN 1 SENSITIVE Sensitive     * >=100,000 COLONIES/mL ENTEROCOCCUS FAECALIS      Medications:   .  enoxaparin (LOVENOX) injection  30 mg Subcutaneous Daily  . fluticasone  1 spray Each Nare Daily  . levothyroxine  50 mcg Intravenous Daily   Continuous Infusions: . dextrose 5 % and 0.45% NaCl 75 mL/hr at 02/14/18 1245      LOS: 6 days   Marinda Elk  [facility] Pager [phone]  *Please refer to [email or web address], password [email or web address] to get updated schedule on who will round on this patient, as hospitalists switch teams weekly. If 7PM-7AM, please contact night-coverage at [email or web address], password [email or web address] for any overnight needs.  02/15/2018, 10:07 AM

## 2018-02-15 NOTE — Consult Note (Signed)
Have attempted to place PIV x3 but pt continues to refuse, informed RN

## 2018-02-15 NOTE — Consult Note (Signed)
Floor RN helped to hold pt arm and PIV was obtained

## 2018-02-16 DIAGNOSIS — B952 Enterococcus as the cause of diseases classified elsewhere: Secondary | ICD-10-CM

## 2018-02-16 DIAGNOSIS — J690 Pneumonitis due to inhalation of food and vomit: Secondary | ICD-10-CM

## 2018-02-16 DIAGNOSIS — N390 Urinary tract infection, site not specified: Secondary | ICD-10-CM

## 2018-02-16 MED ORDER — OXYCODONE HCL 5 MG PO TABS: 2.5000 mg | ORAL_TABLET | Freq: Four times a day (QID) | ORAL | 0 refills | Status: DC | PRN

## 2018-02-16 NOTE — Progress Notes (Addendum)
1:27 pm CSW coordinated disposition planning with SNF and with patient's POA, Ms. Meadows through series of phone calls. Ms. Carole BinningMeadows strongly prefers [facility] and has declined other offers. She is aware that patienCarole Binningt may be responsible for hospital bill if he remains here overnight with the discharge order. POA [facility] and so would really [facility]. [facility] admissions confirmed they will have bed available for patient tomorrow and will arrange to complete admissions paperwork with Ms. Meadows. CSW to follow for discharge planning.  12:42 pm CSW spoke to patient's POA, Ms. Carole BinningMeadows on the phone. [facility] not [facility] bed [facility] until tomorrow. PO[facility] other [facility]. [facility] and has been unable to reach anyone to confirm bed and arrange discharge. CSW following and will support with discharge planning pending bed availability.  Abigail ButtsSusan Andree Heeg, LCSWA [phone]

## 2018-02-16 NOTE — Discharge Summary (Addendum)
Physician Discharge Summary  Devin Clark ZOX:[id number] DOB: 10-14-1920 DOA: 02/06/2018  PCP: Patient, No Pcp Per  Admit date: 02/06/2018 Discharge date: 02/17/2018  Admitted From: Home Disposition:  SNF  Recommendations for Outpatient Follow-up:  1. Follow up with PCP in 1-2 weeks 2. Palliative care to meet with family at skilled nursing facility as he is significantly high risk of current aspiration  Home Health:No Equipment/Devices:none  Discharge Condition:stable CODE STATUS:DNR Diet recommendation:  Dysphagia 3  Brief/Interim Summary: 84 y.o. male past medical history of dementia, chronic atrial fibrillation sinus node dysfunction status post pacemaker hypothyroidism brought into the ED she was found down on the floor by her healthcare power of attorney, no duration of times but healthcare power of attorney relates that she has had multiple recent falls work-up in the ED was negative for fracture UA was concerning for UTI, with basic metabolic panel showing mild hyponatremia with a mildly elevated CK.    Discharge Diagnoses:  Principal Problem:   Fall Active Problems:   Hypertension   Cardiac pacemaker   Coronary artery disease   Alzheimer's dementia   Atrial fibrillation (HCC)   Enterococcus UTI   Aspiration pneumonia of both lower lobes due to gastric secretions (HCC)  Recurrent falls: He had multiple falls imaging showed no fracture or dislocation.  Next Physical therapy evaluated the patient the recommended skilled nursing facility.  Enterococcus faecalis UTI: He was started empirically on IV Rocephin and azithromycin urine culture grew enterococcus pansensitive he was changed to Augmentin which he finished treatment in-house.  Severe dysphagia: Speech therapy evaluated the patient recommended a dysphagia 3 diet with nectar thick as he is very high risk for aspiration. He was discussed with the POA and he is a DNR a conversation over the phone with the  physician on 02/14/2018 they both agreed to proceed with comfort feed knowing the risk of aspiration.  Hypervolemic hyponatremia: Resolved with IV fluid hydration.  Chronic atrial fibrillation: Stable.  Hypothyroidism: Continue Synthroid.    Discharge Instructions  Discharge Instructions    Diet - low sodium heart healthy   Complete by:  As directed    Increase activity slowly   Complete by:  As directed      Allergies as of 02/17/2018   No Known Allergies     Medication List    STOP taking these medications   acetaminophen 500 MG tablet Commonly known as:  TYLENOL   oxyCODONE 5 MG immediate release tablet Commonly known as:  Oxy IR/ROXICODONE     TAKE these medications   BC FAST PAIN RELIEF 845-65 MG Pack Generic drug:  Aspirin-Caffeine Take 1 each by mouth as needed (pain).   fish oil-omega-3 fatty acids 1000 MG capsule Take 2 g by mouth daily.   ibuprofen 200 MG tablet Commonly known as:  ADVIL,MOTRIN Take 600 mg by mouth every 6 (six) hours as needed for moderate pain.   levothyroxine 100 MCG tablet Commonly known as:  SYNTHROID, LEVOTHROID Take 100 mcg by mouth daily before breakfast.   multivitamin with minerals Tabs tablet Take 1 tablet by mouth daily.   simvastatin 80 MG tablet Commonly known as:  ZOCOR Take 1 tablet (80 mg total) by mouth at bedtime.   vitamin B-12 1000 MCG tablet Commonly known as:  CYANOCOBALAMIN Take 1,000 mcg by mouth daily.   vitamin C 500 MG tablet Commonly known as:  ASCORBIC ACID Take 500 mg by mouth daily.   vitamin E 400 UNIT capsule Take 400 Units by mouth  daily.      Contact information for after-discharge care    Destination    [facility] .   Service:  Skilled Nursing Contact information: [address] [place] [address] [phone]             No Known Allergies  Consultations:  None   Procedures/Studies: Ct Head Wo Contrast  Result Date:  02/13/2018 CLINICAL DATA:  Dementia, increased confusion today, altered level of consciousness EXAM: CT HEAD WITHOUT CONTRAST TECHNIQUE: Contiguous axial images were obtained from the base of the skull through the vertex without intravenous contrast. Sagittal and coronal MPR images reconstructed from axial data set. COMPARISON:  02/06/2018 FINDINGS: Brain: Generalized atrophy. Normal ventricular morphology. No midline shift or mass effect. Small vessel chronic ischemic changes of deep cerebral white matter. No intracranial hemorrhage, mass lesion, evidence of acute infarction, or extra-axial fluid collection. Vascular: Atherosclerotic calcification of internal carotid and vertebral arteries at skull base. No hyperdense vessels. Skull: Intact Sinuses/Orbits: Visualized paranasal sinuses and mastoid air cells clear Other: N/A IMPRESSION: Atrophy with small vessel chronic ischemic changes of deep cerebral white matter. No acute intracranial abnormalities. Electronically Signed   By: Ulyses Southward M.D.   On: 02/13/2018 15:08   Ct Head Wo Contrast  Result Date: 02/07/2018 CLINICAL DATA:  84 y/o  M; found on floor. EXAM: CT HEAD WITHOUT CONTRAST TECHNIQUE: Contiguous axial images were obtained from the base of the skull through the vertex without intravenous contrast. COMPARISON:  05/22/2017 CT head. FINDINGS: Brain: No evidence of acute infarction, hemorrhage, hydrocephalus, extra-axial collection or mass lesion/mass effect. Stable chronic microvascular ischemic changes and parenchymal volume loss of the brain. Vascular: Calcific atherosclerosis of the carotid siphons. No hyperdense vessel identified. Skull: Normal. Negative for fracture or focal lesion. Sinuses/Orbits: Mild paranasal sinus mucosal thickening. Small right maxillary sinus fluid level. Normal aeration of mastoid air cells. Bilateral intra-ocular lens replacement. Other: None. IMPRESSION: 1. No acute intracranial abnormality identified. 2. Stable chronic  microvascular ischemic changes and parenchymal volume loss of the brain. 3. Paranasal sinus disease with a right maxillary fluid level which may represent acute sinusitis. Electronically Signed   By: Mitzi Hansen M.D.   On: 02/07/2018 00:43   Dg Pelvis Portable  Result Date: 02/07/2018 CLINICAL DATA:  84 y/o  M; found on floor status post fall. EXAM: PORTABLE PELVIS 1-2 VIEWS COMPARISON:  06/25/2014 pelvic radiograph. FINDINGS: There is no evidence of pelvic fracture or diastasis. No pelvic bone lesions are seen. IMPRESSION: Negative. Electronically Signed   By: Mitzi Hansen M.D.   On: 02/07/2018 04:06   Dg Chest Port 1 View  Result Date: 02/07/2018 CLINICAL DATA:  Status post fall. Concern for chest injury. Initial encounter. EXAM: PORTABLE CHEST 1 VIEW COMPARISON:  Chest radiograph performed 10/03/2016 FINDINGS: The lungs are well-aerated. Left basilar airspace opacity raises concern for pneumonia. No pleural effusion or pneumothorax is seen. Prominence of the thoracic aorta may reflect unfolding of the thoracic aorta, though it appears new from prior studies. Aneurysmal dilatation cannot be excluded. The cardiomediastinal silhouette is otherwise normal in size. A right-sided pacemaker is noted, with leads ending overlying the right atrium and right ventricle. There is chronic superior subluxation of the left humeral head. No acute osseous abnormalities are seen. IMPRESSION: 1. Left basilar airspace opacity raises concern for pneumonia. 2. Prominence of the thoracic aorta may reflect unfolding of the thoracic aorta, though it appears new from prior studies. Aneurysmal dilatation cannot be excluded. Contrast-enhanced CT  of the chest would be helpful for further evaluation. Electronically Signed   By: Roanna RaiderJeffery  Chang M.D.   On: 02/07/2018 06:29   Dg Swallowing Func-speech Pathology  Result Date: 02/13/2018 Objective Swallowing Evaluation: Type of Study: MBS-Modified Barium Swallow  Study  Patient Details Name: Devin Clark MRN: [id number] Date of Birth: 08-09-1921 Today's Date: 02/13/2018 Time: SLP Start Time (ACUTE ONLY): 1420 -SLP Stop Time (ACUTE ONLY): 1450 SLP Time Calculation (min) (ACUTE ONLY): 30 min Past Medical History: Past Medical History: Diagnosis Date . Atrial fibrillation (HCC)  . CAD (coronary artery disease)  . Compression fracture 2015 . Dyslipidemia  . Hypertension  . Hypothyroidism  . Sinus node dysfunction (HCC)   St.Jude pacemaker 06/05/2011 . Stented coronary artery 1998 Past Surgical History: Past Surgical History: Procedure Laterality Date . CARDIAC CATHETERIZATION  03/07/2000  100% CX,widely patent stent in RCA . CARDIAC CATHETERIZATION  03/26/2007  100% CX,widely patnet stent in RCA . CATARACT EXTRACTION, BILATERAL  2011 . CHEST TUBE INSERTION  06/06/11  right iatrogenic pneumothorax . CORONARY ANGIOPLASTY WITH STENT PLACEMENT  12/30/1996  stent RCA . NM MYOVIEW LTD  03/04/2007  High risk,mild-mod anterolateral/inferolateral ischemia . PACEMAKER INSERTION  06/05/11  St.Jude Accent DR . patch perforated duodenal ulcer  05/28/11  Cheree DittoGraham patch HPI: 84 yo male [facility] - PMH + for dementia, Afib, HTN, UTI, sodium levels 147, CAD.  Pt found to have left basilar ATX - concerning for pna per imaging study.  Swallow eval completed two days prior and diet was changed to puree/thin.  Today pt seen to assess po tolerance.  Subjective: pt awake in chair, talkative but disoriented Assessment / Plan / Recommendation CHL IP CLINICAL IMPRESSIONS 02/13/2018 Clinical Impression Pt presents with mild oral and severe pharyngeal dysphagia with sensorimotor deficits. Delay in swallow reflex noted with liquids triggering at pyriform sinus.  Decreased epigllotic deflection/laryngeal elevation/closure results in vallecular more than pyriform sinus residuals .  Chin tuck posture did not improve airway protection.  Small single boluses of nectar were not aspirated but large sequential  boluses were grossly aspirated.  Pt does not sense pharyngeal residuals and does not swallow on command to help decrease. Use of tracely filled and/or dry spoon to tongue did not elicit dry swallow.  Pt with lingual pumping with puree due to oral discoordination.  If pt is to eat - it should be with accepted aspiration risks.  Recommend dys3/nectar *allowing tsps thin water between meals* to help maximize comfort.  SLP Visit Diagnosis Dysphagia, oropharyngeal phase (R13.12);Dysphagia, pharyngoesophageal phase (R13.14) Attention and concentration deficit following -- Frontal lobe and executive function deficit following -- Impact on safety and function Severe aspiration risk;Risk for inadequate nutrition/hydration;Moderate aspiration risk   CHL IP TREATMENT RECOMMENDATION 02/13/2018 Treatment Recommendations Therapy as outlined in treatment plan below   Prognosis 02/13/2018 Prognosis for Safe Diet Advancement Guarded Barriers to Reach Goals Other (Comment);Time post onset;Severity of deficits;Cognitive deficits Barriers/Prognosis Comment -- CHL IP DIET RECOMMENDATION 02/13/2018 SLP Diet Recommendations NPO;Other (Comment) Liquid Administration via Cup Medication Administration Crushed with puree Compensations Slow rate;Small sips/bites Postural Changes Seated upright at 90 degrees;Remain semi-upright after after feeds/meals (Comment)   CHL IP OTHER RECOMMENDATIONS 02/13/2018 Recommended Consults -- Oral Care Recommendations Oral care before and after PO Other Recommendations Order thickener from pharmacy   CHL IP FOLLOW UP RECOMMENDATIONS 02/13/2018 Follow up Recommendations (No Data)   CHL IP FREQUENCY AND DURATION 02/13/2018 Speech Therapy Frequency (ACUTE ONLY) min 1 x/week Treatment Duration 1 week  CHL IP ORAL PHASE 02/13/2018 Oral Phase Impaired Oral - Pudding Teaspoon -- Oral - Pudding Cup -- Oral - Honey Teaspoon -- Oral - Honey Cup -- Oral - Nectar Teaspoon Premature spillage;Delayed oral transit Oral - Nectar Cup  Delayed oral transit;Premature spillage Oral - Nectar Straw Delayed oral transit;Premature spillage Oral - Thin Teaspoon Delayed oral transit;Premature spillage Oral - Thin Cup Delayed oral transit;Premature spillage Oral - Thin Straw Delayed oral transit;Premature spillage Oral - Puree Delayed oral transit;Premature spillage Oral - Mech Soft Delayed oral transit;Premature spillage Oral - Regular -- Oral - Multi-Consistency -- Oral - Pill -- Oral Phase - Comment --  CHL IP PHARYNGEAL PHASE 02/13/2018 Pharyngeal Phase Impaired Pharyngeal- Pudding Teaspoon -- Pharyngeal -- Pharyngeal- Pudding Cup -- Pharyngeal -- Pharyngeal- Honey Teaspoon -- Pharyngeal -- Pharyngeal- Honey Cup -- Pharyngeal -- Pharyngeal- Nectar Teaspoon Reduced pharyngeal peristalsis;Reduced epiglottic inversion;Reduced laryngeal elevation;Reduced airway/laryngeal closure;Delayed swallow initiation-pyriform sinuses;Pharyngeal residue - valleculae Pharyngeal -- Pharyngeal- Nectar Cup Delayed swallow initiation-pyriform sinuses;Delayed swallow initiation-vallecula;Reduced pharyngeal peristalsis;Reduced epiglottic inversion;Reduced laryngeal elevation;Reduced airway/laryngeal closure;Reduced tongue base retraction;Moderate aspiration;Penetration/Aspiration during swallow;Penetration/Apiration after swallow;Pharyngeal residue - valleculae Pharyngeal Material enters airway, passes BELOW cords and not ejected out despite cough attempt by patient;Material enters airway, passes BELOW cords without attempt by patient to eject out (silent aspiration) Pharyngeal- Nectar Straw Reduced epiglottic inversion;Reduced laryngeal elevation;Reduced airway/laryngeal closure;Reduced tongue base retraction;Penetration/Aspiration during swallow;Penetration/Apiration after swallow Pharyngeal Material enters airway, passes BELOW cords without attempt by patient to eject out (silent aspiration) Pharyngeal- Thin Teaspoon Delayed swallow initiation-pyriform sinuses;Reduced  airway/laryngeal closure;Reduced pharyngeal peristalsis;Reduced epiglottic inversion;Reduced tongue base retraction;Pharyngeal residue - valleculae;Pharyngeal residue - pyriform Pharyngeal -- Pharyngeal- Thin Cup -- Pharyngeal -- Pharyngeal- Thin Straw Delayed swallow initiation-pyriform sinuses;Penetration/Aspiration during swallow;Penetration/Apiration after swallow;Reduced pharyngeal peristalsis;Reduced epiglottic inversion;Reduced laryngeal elevation;Reduced airway/laryngeal closure;Reduced tongue base retraction;Pharyngeal residue - valleculae;Pharyngeal residue - pyriform Pharyngeal Material enters airway, passes BELOW cords without attempt by patient to eject out (silent aspiration);Material enters airway, passes BELOW cords and not ejected out despite cough attempt by patient Pharyngeal- Puree Delayed swallow initiation-vallecula;Reduced pharyngeal peristalsis;Reduced epiglottic inversion;Reduced laryngeal elevation;Reduced airway/laryngeal closure;Reduced tongue base retraction;Pharyngeal residue - valleculae Pharyngeal -- Pharyngeal- Mechanical Soft Delayed swallow initiation-vallecula;Reduced airway/laryngeal closure;Reduced pharyngeal peristalsis;Reduced epiglottic inversion;Reduced laryngeal elevation;Reduced tongue base retraction Pharyngeal -- Pharyngeal- Regular -- Pharyngeal -- Pharyngeal- Multi-consistency -- Pharyngeal -- Pharyngeal- Pill -- Pharyngeal -- Pharyngeal Comment pt with secretion penetration/aspiration, chin tuck posture did not prevent aspiration  CHL IP CERVICAL ESOPHAGEAL PHASE 02/13/2018 Cervical Esophageal Phase Impaired Pudding Teaspoon -- Pudding Cup -- Honey Teaspoon -- Honey Cup -- Nectar Teaspoon -- Nectar Cup -- Nectar Straw -- Thin Teaspoon -- Thin Cup -- Thin Straw -- Puree -- Mechanical Soft -- Regular -- Multi-consistency -- Pill -- Cervical Esophageal Comment appearance of prominent cricopharyngeus observed with liquids, did not impair barium flow No flowsheet data found.  Chales Abrahams 02/13/2018, 4:24 PM  Donavan Burnet, MS [facility] SLP [phone]               Subjective: Nonverbal no complaints.  Discharge Exam: Vitals:   02/16/18 2230 02/17/18 0450  BP: 132/72 129/89  Pulse: 67 63  Resp: 18 17  Temp: 97.8 F (36.6 C) 98 F (36.7 C)  SpO2: 97% 100%   Vitals:   02/16/18 0656 02/16/18 1327 02/16/18 2230 02/17/18 0450  BP: 117/66 (!) 158/70 132/72 129/89  Pulse: 74 (!) 59 67 63  Resp: 17 16 18 17   Temp: 97.7 F (36.5 C) 98.5 F (36.9 C) 97.8 F (36.6 C) 98 F (36.7 C)  TempSrc: Oral Oral Oral   SpO2: 98% 100% 97% 100%  Weight:      Height:        General: Pt is alert, awake, not in acute distress Cardiovascular: RRR, S1/S2 +, no rubs, no gallops Respiratory: CTA bilaterally, no wheezing, no rhonchi Abdominal: Soft, NT, ND, bowel sounds + Extremities: no edema, no cyanosis    The results of significant diagnostics from this hospitalization (including imaging, microbiology, ancillary and laboratory) are listed below for reference.     Microbiology: No results found for this or any previous visit (from the past 240 hour(s)).   Labs: BNP (last 3 results) No results for input(s): BNP in the last 8760 hours. Basic Metabolic Panel: Recent Labs  Lab 02/12/18 0458 02/14/18 0924  NA 139 137  K 4.6 3.9  CL 105 103  CO2 28 28  GLUCOSE 107* 109*  BUN 44* 20  CREATININE 0.97 0.70  CALCIUM 8.6* 8.1*   Liver Function Tests: No results for input(s): AST, ALT, ALKPHOS, BILITOT, PROT, ALBUMIN in the last 168 hours. No results for input(s): LIPASE, AMYLASE in the last 168 hours. No results for input(s): AMMONIA in the last 168 hours. CBC: Recent Labs  Lab 02/12/18 0458  WBC 9.8  NEUTROABS 8.2*  HGB 11.6*  HCT 35.7*  MCV 92.7  PLT 396   Cardiac Enzymes: No results for input(s): CKTOTAL, CKMB, CKMBINDEX, TROPONINI in the last 168 hours. BNP: Invalid input(s): POCBNP CBG: No results for input(s): GLUCAP in the last 168  hours. D-Dimer No results for input(s): DDIMER in the last 72 hours. Hgb A1c No results for input(s): HGBA1C in the last 72 hours. Lipid Profile No results for input(s): CHOL, HDL, LDLCALC, TRIG, CHOLHDL, LDLDIRECT in the last 72 hours. Thyroid function studies No results for input(s): TSH, T4TOTAL, T3FREE, THYROIDAB in the last 72 hours.  Invalid input(s): FREET3 Anemia work up No results for input(s): VITAMINB12, FOLATE, FERRITIN, TIBC, IRON, RETICCTPCT in the last 72 hours. Urinalysis    Component Value Date/Time   COLORURINE YELLOW 02/07/2018 0057   APPEARANCEUR CLEAR 02/07/2018 0057   LABSPEC 1.020 02/07/2018 0057   PHURINE 5.0 02/07/2018 0057   GLUCOSEU NEGATIVE 02/07/2018 0057   HGBUR MODERATE (A) 02/07/2018 0057   BILIRUBINUR NEGATIVE 02/07/2018 0057   KETONESUR 5 (A) 02/07/2018 0057   PROTEINUR 30 (A) 02/07/2018 0057   UROBILINOGEN 1.0 06/27/2014 1514   NITRITE NEGATIVE 02/07/2018 0057   LEUKOCYTESUR TRACE (A) 02/07/2018 0057   Sepsis Labs Invalid input(s): PROCALCITONIN,  WBC,  LACTICIDVEN Microbiology No results found for this or any previous visit (from the past 240 hour(s)).   Time coordinating discharge: 35 minutes  SIGNED:   Marinda Elk, MD  [facility] 02/17/2018, 2:31 PM Pager   If 7PM-7AM, please contact night-coverage [email or web address] Password [email or web address]

## 2018-02-17 MED ORDER — RESOURCE THICKENUP CLEAR PO POWD
ORAL | Status: DC | PRN
  Filled 2018-02-17: qty 125

## 2018-02-17 MED ORDER — LEVOTHYROXINE SODIUM 100 MCG PO TABS
100.0000 ug | ORAL_TABLET | Freq: Every day | ORAL | Status: DC
  Administered 2018-02-17: 100 ug via ORAL
  Filled 2018-02-17: qty 1

## 2018-02-17 NOTE — Progress Notes (Signed)
[facility] PROGRESS NOTE    Progress Note  Devin BruinsWilliam C Carel  ZOX:[id number] DOB: 10-Mar-1921 DOA: 02/06/2018 PCP: Patient, No Pcp Per     Brief Narrative:   Devin Clark is an 84 y.o. male past medical history of dementia, chronic atrial fibrillation sinus node dysfunction status post pacemaker hypothyroidism brought into the ED she was found down on the floor by her healthcare power of attorney, no duration of times but healthcare power of attorney relates that she has had multiple recent falls work-up in the ED was negative for fracture UA was concerning for UTI, with basic metabolic panel showing mild hyponatremia with a mildly elevated CK.  Assessment/Plan:   History of fall: History of multiple falls no fractures or dislocation. Evaluated the patient the recommended skilled nursing facility. Patient stable for discharge awaiting bed placement.  Family refused initial skilled nursing facility now they have agreed to [facility].  Enterococcus faecalis UTI: Has completed treatment.  Left base similar pneumonia: Suspicious for aspiration antigens are negative Patient has completed a course of antibiotics.  Severe Dysphagia: Occupational therapy was consulted and recommended a dysphagia 3 diet nectar thick liquid with close monitor for aspiration as she has severe risk of aspirating. Patient is a DNR, palliative care to continue to meet with family at facility as he is high risk for aspiration  Hypovolemic hypernatremia: Resolved with IV fluid hydration.  Chronic atrial fibrillation/sinus node dysfunction: Stable continue to monitor.  Hypothyroidism: Continue Synthroid.  Iron deficiency anemia: Continue to monitor closely hemoglobin stable.   DVT prophylaxis: lovenox Family Communication:none Disposition Plan/Barrier to D/C: SNF in am Code Status:     Code Status Orders  (From admission, onward)        Start     Ordered   02/13/18 1351  Do not attempt  resuscitation (DNR)  Continuous    Question Answer Comment  In the event of cardiac or respiratory ARREST Do not call a "code blue"   In the event of cardiac or respiratory ARREST Do not perform Intubation, CPR, defibrillation or ACLS   In the event of cardiac or respiratory ARREST Use medication by any route, position, wound care, and other measures to relive pain and suffering. May use oxygen, suction and manual treatment of airway obstruction as needed for comfort.      02/13/18 1350    Code Status History    Date Active Date Inactive Code Status Order ID Comments User Context   02/07/2018 0340 02/13/2018 1350 Full Code [id number]  Eduard ClosKakrakandy, Arshad N, MD ED   06/26/2014 0244 06/29/2014 1637 Full Code [id number]  Pearson GrippeKim, James, MD Inpatient        IV Access:    Peripheral IV   Procedures and diagnostic studies:   No results found.   Medical Consultants:    None.  Anti-Infectives:   Augmentin  Subjective:    Devin Clark nonverbal this morning.  Objective:    Vitals:   02/16/18 0656 02/16/18 1327 02/16/18 2230 02/17/18 0450  BP: 117/66 (!) 158/70 132/72 129/89  Pulse: 74 (!) 59 67 63  Resp: 17 16 18 17   Temp: 97.7 F (36.5 C) 98.5 F (36.9 C) 97.8 F (36.6 C) 98 F (36.7 C)  TempSrc: Oral Oral Oral   SpO2: 98% 100% 97% 100%  Weight:      Height:        Intake/Output Summary (Last 24 hours) at 02/17/2018 [id number] Last data filed at 02/17/2018 0600 Gross per 24 hour  Intake 360 ml  Output 0 ml  Net 360 ml   Filed Weights   02/07/18 0450  Weight: 46.7 kg (102 lb 15.3 oz)    Exam: General exam: In no acute distress, cachectic Respiratory system: Good air movement and clear to auscultation. Cardiovascular system: S1 & S2 heard, RRR.  Gastrointestinal system: Abdomen is nondistended, soft and nontender.  Central nervous system: Alert and oriented. No focal neurological deficits. Extremities: No pedal edema. Skin: No rashes, lesions or  ulcers   Data Reviewed:    Labs: Basic Metabolic Panel: Recent Labs  Lab 02/12/18 0458 02/14/18 0924  NA 139 137  K 4.6 3.9  CL 105 103  CO2 28 28  GLUCOSE 107* 109*  BUN 44* 20  CREATININE 0.97 0.70  CALCIUM 8.6* 8.1*   GFR Estimated Creatinine Clearance: 37.3 mL/min (by C-G formula based on SCr of 0.7 mg/dL). Liver Function Tests: No results for input(s): AST, ALT, ALKPHOS, BILITOT, PROT, ALBUMIN in the last 168 hours. No results for input(s): LIPASE, AMYLASE in the last 168 hours. No results for input(s): AMMONIA in the last 168 hours. Coagulation profile No results for input(s): INR, PROTIME in the last 168 hours.  CBC: Recent Labs  Lab 02/12/18 0458  WBC 9.8  NEUTROABS 8.2*  HGB 11.6*  HCT 35.7*  MCV 92.7  PLT 396   Cardiac Enzymes: No results for input(s): CKTOTAL, CKMB, CKMBINDEX, TROPONINI in the last 168 hours. BNP (last 3 results) No results for input(s): PROBNP in the last 8760 hours. CBG: No results for input(s): GLUCAP in the last 168 hours. D-Dimer: No results for input(s): DDIMER in the last 72 hours. Hgb A1c: No results for input(s): HGBA1C in the last 72 hours. Lipid Profile: No results for input(s): CHOL, HDL, LDLCALC, TRIG, CHOLHDL, LDLDIRECT in the last 72 hours. Thyroid function studies: No results for input(s): TSH, T4TOTAL, T3FREE, THYROIDAB in the last 72 hours.  Invalid input(s): FREET3 Anemia work up: No results for input(s): VITAMINB12, FOLATE, FERRITIN, TIBC, IRON, RETICCTPCT in the last 72 hours. Sepsis Labs: Recent Labs  Lab 02/12/18 0458  WBC 9.8   Microbiology No results found for this or any previous visit (from the past 240 hour(s)).   Medications:   . enoxaparin (LOVENOX) injection  30 mg Subcutaneous Daily  . fluticasone  1 spray Each Nare Daily  . levothyroxine  50 mcg Intravenous Daily   Continuous Infusions:     LOS: 8 days   Marinda Elk  [facility] Pager [phone]  *Please refer  to [email or web address], password [email or web address] to get updated schedule on who will round on this patient, as hospitalists switch teams weekly. If 7PM-7AM, please contact night-coverage at [email or web address], password [email or web address] for any overnight needs.  02/17/2018, 8:12 AM

## 2018-02-17 NOTE — Care Management Important Message (Signed)
Important Message  Patient Details  Name: Devin BruinsWilliam C Algeo MRN: [id number] Date of Birth: 1920/11/17   Medicare Important Message Given:  Yes    Caren MacadamFuller, Kenecia Barren 02/17/2018, 2:42 PMImportant Message  Patient Details  Name: Devin BruinsWilliam C Knoedler MRN: [id number] Date of Birth: 1920/11/17   Medicare Important Message Given:  Yes    Caren MacadamFuller, Shawndell Varas 02/17/2018, 2:42 PM

## 2018-02-17 NOTE — Clinical Social Work Placement (Signed)
Patient received and accepted bed offer at [facility]. Facility aware of patient's discharge and confirmed bed offer. PTAR contacted, patient's family notified. Patient's RN can call report to [phone] Room 110, packet complete. CSW signing off, no other needs identified at this time.  CLINICAL SOCIAL WORK PLACEMENT  NOTE  Date:  02/17/2018  Patient Details  Name: Devin BruinsWilliam C Hanauer MRN: [id number] Date of Birth: [birth year]  Clinical Social Work is seeking post-discharge placement for this patient at the Skilled  Nursing Facility level of care (*CSW will initial, date and re-position this form in  chart as items are completed):  Yes   Patient/family provided with [place] Clinical Social Work Department's list of facilities offering this level of care within the geographic area requested by the patient (or if unable, by the patient's family).  Yes   Patient/family informed of their freedom to choose among providers that offer the needed level of care, that participate in Medicare, Medicaid or managed care program needed by the patient, have an available bed and are willing to accept the patient.  Yes   Patient/family informed of [place] [facility]'s ownership interest in [facility] and [facility], as well as of the fact that they are under no obligation to receive care at these facilities.  PASRR submitted to EDS on       PASRR number received on       Existing PASRR number confirmed on 02/13/18     FL2 transmitted to all facilities in geographic area requested by pt/family on 02/13/18     FL2 transmitted to all facilities within larger geographic area on       Patient informed that his/her managed care company has contracts with or will negotiate with certain facilities, including the following:        Yes   Patient/family informed of bed offers received.  Patient chooses bed at [facility]     Physician recommends and patient chooses bed at      Patient  to be transferred to [facility] on 02/17/18.  Patient to be transferred to facility by PTAR     Patient family notified on 02/17/18 of transfer.  Name of family member notified:  Antanice Meadows     PHYSICIAN       Additional Comment:    _______________________________________________ Antionette PolesKimberly L Julie-Ann Vanmaanen, LCSW 02/17/2018, 2:41 PM

## 2018-02-17 NOTE — Progress Notes (Signed)
Report called to Marlowe AltJessica Howell at [facility].Pt to be transported via ambulance

## 2018-02-18 ENCOUNTER — Encounter: Payer: Self-pay | Admitting: Internal Medicine

## 2018-02-18 ENCOUNTER — Non-Acute Institutional Stay (SKILLED_NURSING_FACILITY): Payer: Medicare Other | Admitting: Internal Medicine

## 2018-02-18 DIAGNOSIS — Z66 Do not resuscitate: Secondary | ICD-10-CM

## 2018-02-18 DIAGNOSIS — B952 Enterococcus as the cause of diseases classified elsewhere: Secondary | ICD-10-CM

## 2018-02-18 DIAGNOSIS — F02818 Dementia in other diseases classified elsewhere, unspecified severity, with other behavioral disturbance: Secondary | ICD-10-CM

## 2018-02-18 DIAGNOSIS — G308 Other Alzheimer's disease: Secondary | ICD-10-CM | POA: Diagnosis not present

## 2018-02-18 DIAGNOSIS — R627 Adult failure to thrive: Secondary | ICD-10-CM | POA: Diagnosis not present

## 2018-02-18 DIAGNOSIS — F0281 Dementia in other diseases classified elsewhere with behavioral disturbance: Secondary | ICD-10-CM

## 2018-02-18 DIAGNOSIS — N390 Urinary tract infection, site not specified: Secondary | ICD-10-CM

## 2018-02-18 NOTE — Progress Notes (Signed)
NURSING HOME LOCATION:  [facility] ROOM NUMBER:  [id number]  CODE STATUS:  DNR  PCP:  Patient, No Pcp Per HCPOA  This is a comprehensive admission note to [facility] performed on this date less than 30 days from date of admission. Included are preadmission medical/surgical history; reconciled medication list; family history; social history and comprehensive review of systems.  Corrections and additions to the records were documented. Comprehensive physical exam was also performed. Additionally a clinical summary was entered for each active diagnosis pertinent to this admission in the Problem List to enhance continuity of care.  HPI: Patient was hospitalized 5/3-6/10/19 found on the floor by his HCPOA in the context of multiple recent falls and baseline dementia. Imaging was negative for fracture, urinalysis suggested possible UTI. Empiric IV Rocephin and azithromycin were initiated until culture results returned revealing enterococcus UTI. Patient was transitioned to Augmentin which was finished in-house.[facility] Kitchen. Chemistries revealed mild hyponatremia; CK was mildly elevated.Hyponatremia resolved with IV fluid hydration. Speech therapy recommended dysphagia 3 diet with nectar thick because of high risk of aspiration  Clinically he was found to have aspiration pneumonia both lower lobes.  DO NOT RESUSCITATE was established due to advanced age and multiple comorbidities. SNF placement was recommended At discharge lab abnormalities including glucose 109, calcium 8.1, hemoglobin 11.6 and hematocrit 35.7.   Past medical and surgical history: Includes coronary disease, chronic A. fib, sinus node dysfunction, hypothyroidism, essential hypertension, & dyslipidemia. History of patch for perforated duodenal ulcer, pneumothorax associated with a pacemaker insertion, & coronary angioplasty with stent placement.  Social history: Nondrinker; former smoker.  Family history: Strong family history  heart failure   Review of systems:  Could not be completed due to dementia. His HCPOA provided history. She is a Network engineerneighbor and caregiver. She states he has decreased range of motion of the shoulders due to rotator cuff issues. He has had progressive memory loss, worse over the last month. She states that he needs to be reminded to even eat. He's had recurrent falls. This is due to progressive significant balance issues for which he was previously seen by PT/OT 3-4 years ago. His house is small and he is not using his walker. He's lived in the house for > 4 decades.  He now seems is to have some positional right lower extremity pain.   Physical exam:  Pertinent or positive findings:Hair is thin and disheveled. He is frail and appears cachectic. There is exotropia of the lower right eyelid.   When I attempted to do an oral exam or listen to his posterior chest , he became agitated & slightly combative.  Teeth are coated. Breath sounds are decreased. He is diffusely weak.The pedal pulses are decreased. The left elbow is dressed. He has keratoses diffusely, greatest over the right shin. He has severe bruising over the forearms.  General appearance: no acute distress, increased work of breathing is present.   Lymphatic: No lymphadenopathy about the head, neck, axilla. Eyes: No conjunctival inflammation or lid edema is present. There is no scleral icterus. Ears:  External ear exam shows no significant lesions or deformities.   Nose:  External nasal examination shows no deformity or inflammation. Nasal mucosa are pink and moist without lesions, exudates Oral exam: Lips and gums are healthy appearing.There is no oropharyngeal erythema or exudate. Neck:  No thyromegaly, masses, tenderness noted.    Heart:  Normal rate and regular rhythm. S1 and S2 normal without gallop, murmur, click, rub.  Lungs:  without wheezes,  rhonchi, rales, rubs. Abdomen: Bowel sounds are normal.  Abdomen is soft and nontender with  no organomegaly, hernias, masses. GU: Deferred  Extremities:  No cyanosis, clubbing, edema. Neurologic exam: Balance, Rhomberg, finger to nose testing could not be completed due to clinical state Skin: Warm & dry w/o tenting. No significant rash.  See clinical summary under each active problem in the Problem List with associated updated therapeutic plan

## 2018-02-18 NOTE — Assessment & Plan Note (Signed)
Status post full course of antibiotics as inpatient

## 2018-02-18 NOTE — Assessment & Plan Note (Addendum)
Aricept & Namenda not indicated @ 94 May need mild prn sedation

## 2018-02-19 NOTE — Patient Instructions (Signed)
See assessment and plan under each diagnosis in the problem list and acutely for this visit 

## 2018-02-19 NOTE — Assessment & Plan Note (Signed)
[facility]

## 2018-02-22 ENCOUNTER — Other Ambulatory Visit: Payer: Self-pay

## 2018-02-22 ENCOUNTER — Emergency Department (HOSPITAL_COMMUNITY): Payer: Medicare Other

## 2018-02-22 ENCOUNTER — Encounter (HOSPITAL_COMMUNITY): Payer: Self-pay

## 2018-02-22 ENCOUNTER — Emergency Department (HOSPITAL_COMMUNITY)
Admission: EM | Admit: 2018-02-22 | Discharge: 2018-02-22 | Disposition: A | Discharge status: Skilled Nursing Facility | Payer: Medicare Other | Attending: Emergency Medicine | Admitting: Emergency Medicine

## 2018-02-22 DIAGNOSIS — E039 Hypothyroidism, unspecified: Secondary | ICD-10-CM | POA: Insufficient documentation

## 2018-02-22 DIAGNOSIS — Z87891 Personal history of nicotine dependence: Secondary | ICD-10-CM | POA: Insufficient documentation

## 2018-02-22 DIAGNOSIS — I4891 Unspecified atrial fibrillation: Secondary | ICD-10-CM | POA: Diagnosis not present

## 2018-02-22 DIAGNOSIS — Y999 Unspecified external cause status: Secondary | ICD-10-CM | POA: Diagnosis not present

## 2018-02-22 DIAGNOSIS — I10 Essential (primary) hypertension: Secondary | ICD-10-CM | POA: Insufficient documentation

## 2018-02-22 DIAGNOSIS — S0081XA Abrasion of other part of head, initial encounter: Secondary | ICD-10-CM | POA: Insufficient documentation

## 2018-02-22 DIAGNOSIS — G309 Alzheimer's disease, unspecified: Secondary | ICD-10-CM | POA: Insufficient documentation

## 2018-02-22 DIAGNOSIS — Z950 Presence of cardiac pacemaker: Secondary | ICD-10-CM | POA: Diagnosis not present

## 2018-02-22 DIAGNOSIS — S81811A Laceration without foreign body, right lower leg, initial encounter: Secondary | ICD-10-CM

## 2018-02-22 DIAGNOSIS — Z79899 Other long term (current) drug therapy: Secondary | ICD-10-CM | POA: Insufficient documentation

## 2018-02-22 DIAGNOSIS — Y939 Activity, unspecified: Secondary | ICD-10-CM | POA: Diagnosis not present

## 2018-02-22 DIAGNOSIS — S20411A Abrasion of right back wall of thorax, initial encounter: Secondary | ICD-10-CM | POA: Diagnosis not present

## 2018-02-22 DIAGNOSIS — I2510 Atherosclerotic heart disease of native coronary artery without angina pectoris: Secondary | ICD-10-CM | POA: Diagnosis not present

## 2018-02-22 DIAGNOSIS — Y92122 Bedroom in nursing home as the place of occurrence of the external cause: Secondary | ICD-10-CM | POA: Diagnosis not present

## 2018-02-22 DIAGNOSIS — W06XXXA Fall from bed, initial encounter: Secondary | ICD-10-CM | POA: Diagnosis not present

## 2018-02-22 DIAGNOSIS — Z043 Encounter for examination and observation following other accident: Secondary | ICD-10-CM | POA: Diagnosis present

## 2018-02-22 DIAGNOSIS — W19XXXA Unspecified fall, initial encounter: Secondary | ICD-10-CM

## 2018-02-22 NOTE — ED Notes (Signed)
Patients skin tears cleaned with NS and Tegaderm applied.

## 2018-02-22 NOTE — ED Notes (Signed)
Pt unable to sign for d/c 

## 2018-02-22 NOTE — ED Triage Notes (Signed)
Arrived via EMS from [facility]. Larey SeatFell out of bed, skin tear noted on right elbow and right knee, abrasion noted on right cheek. Dried blood noted right side of head above right ear. Per EMS, [facility] stated patient is at baseline as far as mentation.

## 2018-02-22 NOTE — ED Notes (Signed)
PTAR called for transportation  

## 2018-02-22 NOTE — ED Notes (Signed)
Bed: WA03 Expected date:  Expected time:  Means of arrival:  Comments: 84 yr old fall, head injury

## 2018-02-22 NOTE — ED Provider Notes (Signed)
[facility]-EMERGENCY DEPT Provider Note   CSN: [id number] Arrival date & time: 02/22/18  4098     History   Chief Complaint Chief Complaint  Patient presents with  . Fall    HPI Devin Clark is a 84 y.o. male.  84 yo M with a chief complaint of a fall.  Patient has no current complaints.    Per the nursing home the patient fell out of bed and had some signs of bleeding to the right side of the face.  Sent for evaluation.  The history is provided by the patient and the nursing home.  Fall  This is a new problem. The current episode started 3 to 5 hours ago. The problem occurs constantly. The problem has not changed since onset.Pertinent negatives include no chest pain, no abdominal pain, no headaches and no shortness of breath. Nothing aggravates the symptoms. He has tried nothing for the symptoms. The treatment provided no relief.    Past Medical History:  Diagnosis Date  . Atrial fibrillation (HCC)   . CAD (coronary artery disease)   . Compression fracture 2015  . Dyslipidemia   . Hypertension   . Hypothyroidism   . Sinus node dysfunction (HCC)    St.Jude pacemaker 06/05/2011  . Stented coronary artery 1998    Patient Active Problem List   Diagnosis Date Noted  . Adult failure to thrive 02/18/2018  . DNR no code (do not resuscitate) 02/18/2018  . Enterococcus UTI 02/16/2018  . Aspiration pneumonia of both lower lobes due to gastric secretions (HCC) 02/16/2018  . Fall at home   . Lumbar compression fracture (HCC) 06/26/2014  . Compression fracture 06/26/2014  . Atrial fibrillation (HCC) 11/26/2012  . Long term (current) use of anticoagulants 11/26/2012  . Alzheimer's dementia 07/05/2011  . Cerumen impaction 07/05/2011  . Hypertension 06/13/2011  . Supraspinatus tendonitis 06/13/2011  . Hyperlipidemia 06/13/2011  . Cardiac pacemaker 06/05/2011  . Perforated duodenal ulcer (HCC) 05/28/2011    Class: Status post  . Coronary artery  disease 06/12/1997    Past Surgical History:  Procedure Laterality Date  . CARDIAC CATHETERIZATION  03/07/2000   100% CX,widely patent stent in RCA  . CARDIAC CATHETERIZATION  03/26/2007   100% CX,widely patnet stent in RCA  . CATARACT EXTRACTION, BILATERAL  2011  . CHEST TUBE INSERTION  06/06/11   right iatrogenic pneumothorax  . CORONARY ANGIOPLASTY WITH STENT PLACEMENT  12/30/1996   stent RCA  . NM MYOVIEW LTD  03/04/2007   High risk,mild-mod anterolateral/inferolateral ischemia  . PACEMAKER INSERTION  06/05/11   St.Jude Accent DR  . patch perforated duodenal ulcer  05/28/11   Cheree Ditto patch        Home Medications    Prior to Admission medications   Medication Sig Start Date End Date Taking? Authorizing Provider  fish oil-omega-3 fatty acids 1000 MG capsule Take 2 g by mouth daily.   Yes [provider]  ibuprofen (ADVIL,MOTRIN) 200 MG tablet Take 600 mg by mouth every 6 (six) hours as needed for moderate pain.   Yes [provider]  levothyroxine (SYNTHROID, LEVOTHROID) 100 MCG tablet Take 100 mcg by mouth daily before breakfast.   Yes [provider]  Multiple Vitamin (MULTIVITAMIN WITH MINERALS) TABS Take 1 tablet by mouth daily.   Yes [provider]  oxyCODONE (OXY IR/ROXICODONE) 5 MG immediate release tablet Take 2.5 mg by mouth every 6 (six) hours as needed for severe pain.   Yes [provider]  simvastatin (ZOCOR) 80 MG tablet Take 1 tablet (80 mg total) by mouth at bedtime. 11/09/13  Yes Croitoru, Mihai, MD  vitamin B-12 (CYANOCOBALAMIN) 1000 MCG tablet Take 1,000 mcg by mouth daily.   Yes [provider]  vitamin C (ASCORBIC ACID) 500 MG tablet Take 500 mg by mouth daily.   Yes [provider]  vitamin E 400 UNIT capsule Take 400 Units by mouth daily.   Yes [provider]    Family History Family History  Problem Relation Age of Onset  . Heart failure Mother   . Heart failure Father   . Heart  failure Sister     Social History Social History   Tobacco Use  . Smoking status: Former Smoker    Years: 1.00    Types: Cigarettes  . Smokeless tobacco: Never Used  Substance Use Topics  . Alcohol use: No  . Drug use: No     Allergies   Patient has no known allergies.   Review of Systems Review of Systems  Constitutional: Negative for chills and fever.  HENT: Negative for congestion and facial swelling.   Eyes: Negative for discharge and visual disturbance.  Respiratory: Negative for shortness of breath.   Cardiovascular: Negative for chest pain and palpitations.  Gastrointestinal: Negative for abdominal pain, diarrhea and vomiting.  Musculoskeletal: Negative for arthralgias and myalgias.  Skin: Negative for color change and rash.  Neurological: Negative for tremors, syncope and headaches.  Psychiatric/Behavioral: Negative for confusion and dysphoric mood.     Physical Exam Updated Vital Signs BP 94/67 (BP Location: Right Arm)   Pulse 64   Temp 97.9 F (36.6 C) (Oral)   Resp 20   Ht 5\' 9"  (1.753 m)   Wt 45.4 kg (100 lb)   SpO2 99%   BMI 14.77 kg/m   Physical Exam  Constitutional: He appears well-developed and well-nourished.  HENT:  Head: Normocephalic.  Abrasion to the right cheek.  Eyes: Pupils are equal, round, and reactive to light. EOM are normal.  Neck: Normal range of motion. Neck supple. No JVD present.  Cardiovascular: Normal rate and regular rhythm. Exam reveals no gallop and no friction rub.  No murmur heard. Pulmonary/Chest: No respiratory distress. He has no wheezes.  Abdominal: He exhibits no distension and no mass. There is no tenderness. There is no rebound and no guarding.  Musculoskeletal: Normal range of motion.  No midline spinal tenderness.  The patient has some skin tears over the right thoracic back about the posterior rib angle about ribs 8 through 10.  Tenderness over the skin tears but no noted bony tenderness skin tear to the  right knee contractured to the lower extremities bilaterally  Neurological: He is alert.  Skin: No rash noted. No pallor.  Psychiatric: He has a normal mood and affect. His behavior is normal.  Nursing note and vitals reviewed.    ED Treatments / Results  Labs (all labs ordered are listed, but only abnormal results are displayed) Labs Reviewed - No data to display  EKG None  Radiology Ct Head Wo Contrast  Result Date: 02/22/2018 CLINICAL DATA:  Fall, head trauma EXAM: CT HEAD WITHOUT CONTRAST TECHNIQUE: Contiguous axial images were obtained from the base of the skull through the vertex without intravenous contrast. COMPARISON:  02/13/2018 FINDINGS: Brain: Advanced atrophy and chronic small vessel disease throughout the deep white matter. Associated ventriculomegaly. No acute intracranial abnormality. Specifically, no hemorrhage, hydrocephalus, mass lesion, acute infarction, or significant intracranial injury. Vascular: No hyperdense vessel  or unexpected calcification. Skull: No acute calvarial abnormality. Sinuses/Orbits: Visualized paranasal sinuses and mastoids clear. Orbital soft tissues unremarkable. Other: None IMPRESSION: No acute intracranial abnormality. Atrophy, chronic microvascular disease. Electronically Signed   By: Charlett NoseKevin  Dover M.D.   On: 02/22/2018 08:39    Procedures Procedures (including critical care time)  Medications Ordered in ED Medications - No data to display   Initial Impression / Assessment and Plan / ED Course  I have reviewed the triage vital signs and the nursing notes.  Pertinent labs & imaging results that were available during my care of the patient were reviewed by me and considered in my medical decision making (see chart for details).     84 yo M with a chief complaint of a fall.  The patient appears to be bedbound based on his contractures.  He has some skin tears to his upper back as well as to his right knee and the right side of his face.  He  currently has no complaints is just asking to have no needle sticks.  Will obtain a CT of the head.  CT viewed by me without any intracranial bleeding.  We will discharge the patient home.  8:52 AM:  I have discussed the diagnosis/risks/treatment options with the patient and believe the pt to be eligible for discharge home to follow-up with PCP. We also discussed returning to the ED immediately if new or worsening sx occur. We discussed the sx which are most concerning (e.g., sudden worsening pain, fever, inability to tolerate by mouth) that necessitate immediate return. Medications administered to the patient during their visit and any new prescriptions provided to the patient are listed below.  Medications given during this visit Medications - No data to display   The patient appears reasonably screen and/or stabilized for discharge and I doubt any other medical condition or other [facility] requiring further screening, evaluation, or treatment in the ED at this time prior to discharge.    Final Clinical Impressions(s) / ED Diagnoses   Final diagnoses:  Fall, initial encounter  Skin tear of right lower leg without complication, initial encounter  Facial abrasion, initial encounter  Abrasion of right side of back, initial encounter    ED Discharge Orders    None       Melene PlanFloyd, Keylon Labelle, DO 02/22/18 [phone]

## 2018-03-11 ENCOUNTER — Non-Acute Institutional Stay (SKILLED_NURSING_FACILITY): Payer: Medicare Other

## 2018-03-11 DIAGNOSIS — Z0000 Encounter for general adult medical examination without abnormal findings: Secondary | ICD-10-CM | POA: Diagnosis not present

## 2018-03-11 NOTE — Patient Instructions (Signed)
[name] , Thank you for taking time to come for your Medicare Wellness Visit. I appreciate your ongoing commitment to your health goals. Please review the following plan we discussed and let me know if I can assist you in the future.   Screening recommendations/referrals: Colonoscopy excluded, over age 82 Recommended yearly ophthalmology/optometry visit for glaucoma screening and checkup Recommended yearly dental visit for hygiene and checkup  Vaccinations: Influenza vaccine up to date, due 2019 fall season Pneumococcal vaccine 13 due, ordered  Tdap vaccine up to date, due 06/17/2024 Shingles vaccine not in past records    Advanced directives: in chart  Conditions/risks identified: none  Next appointment: Dr. Alwyn Clark makes rounds  Preventive Care 65 Years and Older, Male Preventive care refers to lifestyle choices and visits with your health care provider that can promote health and wellness. What does preventive care include?  A yearly physical exam. This is also called an annual well check.  Dental exams once or twice a year.  Routine eye exams. Ask your health care provider how often you should have your eyes checked.  Personal lifestyle choices, including:  Daily care of your teeth and gums.  Regular physical activity.  Eating a healthy diet.  Avoiding tobacco and drug use.  Limiting alcohol use.  Practicing safe sex.  Taking low doses of aspirin every day.  Taking vitamin and mineral supplements as recommended by your health care provider. What happens during an annual well check? The services and screenings done by your health care provider during your annual well check will depend on your age, overall health, lifestyle risk factors, and family history of disease. Counseling  Your health care provider may ask you questions about your:  Alcohol use.  Tobacco use.  Drug use.  Emotional well-being.  Home and relationship well-being.  Sexual  activity.  Eating habits.  History of falls.  Memory and ability to understand (cognition).  Work and work Astronomerenvironment. Screening  You may have the following tests or measurements:  Height, weight, and BMI.  Blood pressure.  Lipid and cholesterol levels. These may be checked every 5 years, or more frequently if you are over 82 years old.  Skin check.  Lung cancer screening. You may have this screening every year starting at age 82 if you have a 30-pack-year history of smoking and currently smoke or have quit within the past 15 years.  Fecal occult blood test (FOBT) of the stool. You may have this test every year starting at age 82.  Flexible sigmoidoscopy or colonoscopy. You may have a sigmoidoscopy every 5 years or a colonoscopy every 10 years starting at age 82.  Prostate cancer screening. Recommendations will vary depending on your family history and other risks.  Hepatitis C blood test.  Hepatitis B blood test.  Sexually transmitted disease (STD) testing.  Diabetes screening. This is done by checking your blood sugar (glucose) after you have not eaten for a while (fasting). You may have this done every 1-3 years.  Abdominal aortic aneurysm (AAA) screening. You may need this if you are a current or former smoker.  Osteoporosis. You may be screened starting at age 82 if you are at high risk. Talk with your health care provider about your test results, treatment options, and if necessary, the need for more tests. Vaccines  Your health care provider may recommend certain vaccines, such as:  Influenza vaccine. This is recommended every year.  Tetanus, diphtheria, and acellular pertussis (Tdap, Td) vaccine. You may need a  Td booster every 10 years.  Zoster vaccine. You may need this after age 86.  Pneumococcal 13-valent conjugate (PCV13) vaccine. One dose is recommended after age 70.  Pneumococcal polysaccharide (PPSV23) vaccine. One dose is recommended after age  69. Talk to your health care provider about which screenings and vaccines you need and how often you need them. This information is not intended to replace advice given to you by your health care provider. Make sure you discuss any questions you have with your health care provider. Document Released: 09/23/2015 Document Revised: 05/16/2016 Document Reviewed: 06/28/2015 Elsevier Interactive Patient Education  2017 [place] Prevention in the Home Falls can cause injuries. They can happen to people of all ages. There are many things you can do to make your home safe and to help prevent falls. What can I do on the outside of my home?  Regularly fix the edges of walkways and driveways and fix any cracks.  Remove anything that might make you trip as you walk through a door, such as a raised step or threshold.  Trim any bushes or trees on the path to your home.  Use bright outdoor lighting.  Clear any walking paths of anything that might make someone trip, such as rocks or tools.  Regularly check to see if handrails are loose or broken. Make sure that both sides of any steps have handrails.  Any raised decks and porches should have guardrails on the edges.  Have any leaves, snow, or ice cleared regularly.  Use sand or salt on walking paths during winter.  Clean up any spills in your garage right away. This includes oil or grease spills. What can I do in the bathroom?  Use night lights.  Install grab bars by the toilet and in the tub and shower. Do not use towel bars as grab bars.  Use non-skid mats or decals in the tub or shower.  If you need to sit down in the shower, use a plastic, non-slip stool.  Keep the floor dry. Clean up any water that spills on the floor as soon as it happens.  Remove soap buildup in the tub or shower regularly.  Attach bath mats securely with double-sided non-slip rug tape.  Do not have throw rugs and other things on the floor that can make  you trip. What can I do in the bedroom?  Use night lights.  Make sure that you have a light by your bed that is easy to reach.  Do not use any sheets or blankets that are too big for your bed. They should not hang down onto the floor.  Have a firm chair that has side arms. You can use this for support while you get dressed.  Do not have throw rugs and other things on the floor that can make you trip. What can I do in the kitchen?  Clean up any spills right away.  Avoid walking on wet floors.  Keep items that you use a lot in easy-to-reach places.  If you need to reach something above you, use a strong step stool that has a grab bar.  Keep electrical cords out of the way.  Do not use floor polish or wax that makes floors slippery. If you must use wax, use non-skid floor wax.  Do not have throw rugs and other things on the floor that can make you trip. What can I do with my stairs?  Do not leave any items on the stairs.  Make sure that there are handrails on both sides of the stairs and use them. Fix handrails that are broken or loose. Make sure that handrails are as long as the stairways.  Check any carpeting to make sure that it is firmly attached to the stairs. Fix any carpet that is loose or worn.  Avoid having throw rugs at the top or bottom of the stairs. If you do have throw rugs, attach them to the floor with carpet tape.  Make sure that you have a light switch at the top of the stairs and the bottom of the stairs. If you do not have them, ask someone to add them for you. What else can I do to help prevent falls?  Wear shoes that:  Do not have high heels.  Have rubber bottoms.  Are comfortable and fit you well.  Are closed at the toe. Do not wear sandals.  If you use a stepladder:  Make sure that it is fully opened. Do not climb a closed stepladder.  Make sure that both sides of the stepladder are locked into place.  Ask someone to hold it for you, if  possible.  Clearly mark and make sure that you can see:  Any grab bars or handrails.  First and last steps.  Where the edge of each step is.  Use tools that help you move around (mobility aids) if they are needed. These include:  Canes.  Walkers.  Scooters.  Crutches.  Turn on the lights when you go into a dark area. Replace any light bulbs as soon as they burn out.  Set up your furniture so you have a clear path. Avoid moving your furniture around.  If any of your floors are uneven, fix them.  If there are any pets around you, be aware of where they are.  Review your medicines with your doctor. Some medicines can make you feel dizzy. This can increase your chance of falling. Ask your doctor what other things that you can do to help prevent falls. This information is not intended to replace advice given to you by your health care provider. Make sure you discuss any questions you have with your health care provider. Document Released: 06/23/2009 Document Revised: 02/02/2016 Document Reviewed: 10/01/2014 Elsevier Interactive Patient Education  2017 [employer].

## 2018-03-11 NOTE — Progress Notes (Addendum)
Subjective:   AXELL TRIGUEROS is a 84 y.o. male who presents for an Initial Medicare Annual Wellness Visit at [facility]   Objective:    Today's Vitals   03/11/18 1003  BP: 115/60  Pulse: 87  Temp: 98.1 F (36.7 C)  TempSrc: Oral  SpO2: 95%  Weight: 107 lb 9.6 oz (48.8 kg)  Height: 5\' 9"  (1.753 m)   Body mass index is 15.89 kg/m.  Advanced Directives 03/11/2018 02/07/2018 05/22/2017 02/19/2016 06/25/2014 06/17/2014  Does Patient Have a Medical Advance Directive? No No No Yes No No  Type of Advance Directive - - Web designer;Living will - -  Does patient want to make changes to medical advance directive? - - - No - Patient declined - -  Copy of Healthcare Power of Attorney in Chart? - - - No - copy requested - -  Would patient like information on creating a medical advance directive? No - Patient declined No - Patient declined No - Patient declined - Yes - Educational materials given Yes - Educational materials given    Current Medications (verified) Outpatient Encounter Medications as of 03/11/2018  Medication Sig  . fish oil-omega-3 fatty acids 1000 MG capsule Take 2 g by mouth daily.  Marland Kitchen ibuprofen (ADVIL,MOTRIN) 200 MG tablet Take 600 mg by mouth every 6 (six) hours as needed for moderate pain.  Marland Kitchen levothyroxine (SYNTHROID, LEVOTHROID) 100 MCG tablet Take 100 mcg by mouth daily before breakfast.  . Multiple Vitamin (MULTIVITAMIN WITH MINERALS) TABS Take 1 tablet by mouth daily.  Marland Kitchen oxyCODONE (OXY IR/ROXICODONE) 5 MG immediate release tablet Take 2.5 mg by mouth every 6 (six) hours as needed for severe pain.  . simvastatin (ZOCOR) 80 MG tablet Take 1 tablet (80 mg total) by mouth at bedtime.  . vitamin B-12 (CYANOCOBALAMIN) 1000 MCG tablet Take 1,000 mcg by mouth daily.  . vitamin C (ASCORBIC ACID) 500 MG tablet Take 500 mg by mouth daily.  . vitamin E 400 UNIT capsule Take 400 Units by mouth daily.   No facility-administered encounter medications on  file as of 03/11/2018.     Allergies (verified) Patient has no known allergies.   History: Past Medical History:  Diagnosis Date  . Atrial fibrillation (HCC)   . CAD (coronary artery disease)   . Compression fracture 2015  . Dyslipidemia   . Hypertension   . Hypothyroidism   . Sinus node dysfunction (HCC)    St.Jude pacemaker 06/05/2011  . Stented coronary artery 1998   Past Surgical History:  Procedure Laterality Date  . CARDIAC CATHETERIZATION  03/07/2000   100% CX,widely patent stent in RCA  . CARDIAC CATHETERIZATION  03/26/2007   100% CX,widely patnet stent in RCA  . CATARACT EXTRACTION, BILATERAL  2011  . CHEST TUBE INSERTION  06/06/11   right iatrogenic pneumothorax  . CORONARY ANGIOPLASTY WITH STENT PLACEMENT  12/30/1996   stent RCA  . NM MYOVIEW LTD  03/04/2007   High risk,mild-mod anterolateral/inferolateral ischemia  . PACEMAKER INSERTION  06/05/11   St.Jude Accent DR  . patch perforated duodenal ulcer  05/28/11   Cheree Ditto patch   Family History  Problem Relation Age of Onset  . Heart failure Mother   . Heart failure Father   . Heart failure Sister    Social History   Socioeconomic History  . Marital status: Single    Spouse name: Not on file  . Number of children: 2  . Years of education: 29  .  Highest education level: Not on file  Occupational History    Employer: RETIRED  Social Needs  . Financial resource strain: Not hard at all  . Food insecurity:    Worry: Never true    Inability: Never true  . Transportation needs:    Medical: No    Non-medical: No  Tobacco Use  . Smoking status: Former Smoker    Years: 1.00    Types: Cigarettes  . Smokeless tobacco: Never Used  Substance and Sexual Activity  . Alcohol use: No  . Drug use: No  . Sexual activity: Not Currently  Lifestyle  . Physical activity:    Days per week: 0 days    Minutes per session: 0 min  . Stress: Not at all  Relationships  . Social connections:    Talks on phone: Once a week      Gets together: Once a week    Attends religious service: Never    Active member of club or organization: No    Attends meetings of clubs or organizations: Never    Relationship status: Never married  Other Topics Concern  . Not on file  Social History Narrative   Retired Systems analystmissle guidance technician   Divorced 1966   Two sons living in [place]   Native of [place], [place] part of [place] in [place], [place]   Tobacco Counseling Counseling given: Not Answered   Clinical Intake:  Pre-visit preparation completed: No  Pain : No/denies pain     Nutritional Risks: None Diabetes: No  How often do you need to have someone help you when you read instructions, pamphlets, or other written materials from your doctor or pharmacy?: 3 - Sometimes  Interpreter Needed?: No  Information entered by :: Tyron RussellSara Saunders, RN  Activities of Daily Living In your present state of health, do you have any difficulty performing the following activities: 03/11/2018 02/07/2018  Hearing? Y -  Vision? N -  Difficulty concentrating or making decisions? Y -  Walking or climbing stairs? Y Y  Dressing or bathing? Y -  Doing errands, shopping? Malvin JohnsY Y  Preparing Food and eating ? Y -  Using the Toilet? Y -  In the past six months, have you accidently leaked urine? Y -  Do you have problems with loss of bowel control? Y -  Managing your Medications? Y -  Managing your Finances? Y -  Housekeeping or managing your Housekeeping? Y -  Some recent data might be hidden     Immunizations and Health Maintenance Immunization History  Administered Date(s) Administered  . Influenza, Seasonal, Injecte, Preservative Fre 06/27/2014  . Influenza,inj,Quad PF,6+ Mos 06/27/2014  . Pneumococcal Polysaccharide-23 06/27/2014  . Tdap 06/17/2014   Health Maintenance Due  Topic Date Due  . PNA vac Low Risk Adult (2 of 2 - PCV13) 06/28/2015    Patient Care Team: Patient, No Pcp Per as PCP - General (General  Practice)  Indicate any recent Medical Services you may have received from other than [facility] providers in the past year (date may be approximate).    Assessment:   This is a routine wellness examination for Chrissie NoaWilliam.  Hearing/Vision screen No exam data present  Dietary issues and exercise activities discussed: Current Exercise Habits: The patient does not participate in regular exercise at present, Exercise limited by: neurologic condition(s);orthopedic condition(s)  Goals    None     Depression Screen PHQ 2/9 Scores 03/11/2018 06/14/2011  PHQ - 2 Score 0 0    Fall  Risk Fall Risk  03/11/2018 06/14/2011  Falls in the past year? Yes -  Number falls in past yr: 2 or more -  Injury with Fall? No -  Risk for fall due to : - Impaired balance/gait;Other (Comment)  Risk for fall due to: Comment - weakness    Is the patient's home free of loose throw rugs in walkways, pet beds, electrical cords, etc?   yes      Grab bars in the bathroom? yes      Handrails on the stairs?   yes      Adequate lighting?   yes  Timed Get Up and Go performed: unambulatory  Cognitive Function:     6CIT Screen 03/11/2018  What Year? 4 points  What month? 0 points  What time? 0 points  Count back from 20 0 points  Months in reverse 4 points  Repeat phrase 8 points  Total Score 16    Screening Tests Health Maintenance  Topic Date Due  . PNA vac Low Risk Adult (2 of 2 - PCV13) 06/28/2015  . INFLUENZA VACCINE  04/10/2018  . TETANUS/TDAP  06/17/2024    Qualifies for Shingles Vaccine? Not in past records  Cancer Screenings: Lung: Low Dose CT Chest recommended if Age 67-80 years, 30 pack-year currently smoking OR have quit w/in 15years. Patient does not qualify. Colorectal: up to date  Additional Screenings:  Hepatitis C Screening: declined Prevnar diue: ordered     Plan:  I have personally reviewed and addressed the Medicare Annual Wellness questionnaire and have noted the following in the  patient's chart:  A. Medical and social history B. Use of alcohol, tobacco or illicit drugs  C. Current medications and supplements D. Functional ability and status E.  Nutritional status F.  Physical activity G. Advance directives H. List of other physicians I.  Hospitalizations, surgeries, and ER visits in previous 12 months J.  Vitals K. Screenings to include hearing, vision, cognitive, depression L. Referrals and appointments - none  In addition, I have reviewed and discussed with patient certain preventive protocols, quality metrics, and best practice recommendations. A written personalized care plan for preventive services as well as general preventive health recommendations were provided to patient.  See attached scanned questionnaire for additional information.   Signed,   Tyron Russell, RN Nurse Health Advisor  Patient Concerns: None I have personally reviewed the health advisor's clinical note, was available for consultation, and agree with the assessment and plan as written. Pecola Lawless M.D., FACP, [facility]

## 2018-03-12 ENCOUNTER — Encounter: Payer: Self-pay | Admitting: Adult Health

## 2018-03-12 ENCOUNTER — Non-Acute Institutional Stay (SKILLED_NURSING_FACILITY): Payer: Medicare Other | Admitting: Adult Health

## 2018-03-12 DIAGNOSIS — R627 Adult failure to thrive: Secondary | ICD-10-CM

## 2018-03-12 DIAGNOSIS — R600 Localized edema: Secondary | ICD-10-CM

## 2018-03-12 DIAGNOSIS — E785 Hyperlipidemia, unspecified: Secondary | ICD-10-CM | POA: Diagnosis not present

## 2018-03-12 DIAGNOSIS — G308 Other Alzheimer's disease: Secondary | ICD-10-CM

## 2018-03-12 DIAGNOSIS — F02818 Dementia in other diseases classified elsewhere, unspecified severity, with other behavioral disturbance: Secondary | ICD-10-CM

## 2018-03-12 DIAGNOSIS — F0281 Dementia in other diseases classified elsewhere with behavioral disturbance: Secondary | ICD-10-CM | POA: Diagnosis not present

## 2018-03-12 DIAGNOSIS — E039 Hypothyroidism, unspecified: Secondary | ICD-10-CM | POA: Diagnosis not present

## 2018-03-12 LAB — CBC AND DIFFERENTIAL
HCT: 32 — AB (ref 41–53)
Hemoglobin: 10.1 — AB (ref 13.5–17.5)
Neutrophils Absolute: 10
Platelets: 378 (ref 150–399)
WBC: 11.1

## 2018-03-12 LAB — BASIC METABOLIC PANEL
BUN: 21 (ref 4–21)
Creatinine: 0.7 (ref 0.6–1.3)
GLUCOSE: 81
POTASSIUM: 3.7 (ref 3.4–5.3)
Sodium: 144 (ref 137–147)

## 2018-03-12 NOTE — Progress Notes (Signed)
Location:  [facility] Room Number: 116-A Place of Service:  SNF (31) Provider:  Kenard Gower, NP  Patient Care Team: Patient, No Pcp Per as PCP - General (General Practice)  Extended Emergency Contact Information Primary Emergency Contact: Dolan Amen [place] Home Phone: [phone] Relation: Neighbor  Code Status:  DNR  Goals of care: Advanced Directive information Advanced Directives 03/12/2018  Does Patient Have a Medical Advance Directive? Yes  Type of Advance Directive Out of facility DNR (pink MOST or yellow form)  Does patient want to make changes to medical advance directive? No - Patient declined  Copy of Healthcare Power of Attorney in Chart? -  Would patient like information on creating a medical advance directive? No - Patient declined     Chief Complaint  Patient presents with  . Medical Management of Chronic Issues    The patient is seen for a routine [facility] visit    HPI:  Pt is a 84 y.o. male seen today for medical management of chronic diseases.  He is a long-term care resident of [facility].  He has a PMH of coronary disease, chronic atrial fibrillation, sinus node dysfunction, hypothyroidism, essential hypertension, and dyslipidemia. He was noted to have bilateral feet edema 2-3+, redness and tenderness on right big toe. No open wound noted.  He has been admitted to [facility] on 04/20/18 from a recent hospitalization for having multiple falls. No fracture was noted. No UTI.   Past Medical History:  Diagnosis Date  . Atrial fibrillation (HCC)   . CAD (coronary artery disease)   . Compression fracture 2015  . Dyslipidemia   . Hypertension   . Hypothyroidism   . Sinus node dysfunction (HCC)    St.Jude pacemaker 06/05/2011  . Stented coronary artery 1998   Past Surgical History:  Procedure Laterality Date  . CARDIAC CATHETERIZATION  03/07/2000   100% CX,widely patent stent in RCA  . CARDIAC CATHETERIZATION  03/26/2007   100% CX,widely patnet stent in RCA  . CATARACT EXTRACTION, BILATERAL  2011  . CHEST TUBE INSERTION  06/06/11   right iatrogenic pneumothorax  . CORONARY ANGIOPLASTY WITH STENT PLACEMENT  12/30/1996   stent RCA  . NM MYOVIEW LTD  03/04/2007   High risk,mild-mod anterolateral/inferolateral ischemia  . PACEMAKER INSERTION  06/05/11   St.Jude Accent DR  . patch perforated duodenal ulcer  05/28/11   Cheree Ditto patch    No Known Allergies  Outpatient Encounter Medications as of 03/12/2018  Medication Sig  . fish oil-omega-3 fatty acids 1000 MG capsule Take 2 g by mouth daily.  Marland Kitchen ibuprofen (ADVIL,MOTRIN) 200 MG tablet Take 600 mg by mouth every 6 (six) hours as needed for moderate pain.  Marland Kitchen levothyroxine (SYNTHROID, LEVOTHROID) 100 MCG tablet Take 100 mcg by mouth daily before breakfast.  . Multiple Vitamin (MULTIVITAMIN WITH MINERALS) TABS Take 1 tablet by mouth daily.  . Nutritional Supplements (NUTRITIONAL SUPPLEMENT PO) Take 1 each by mouth 2 (two) times daily. Magic Cup  . simvastatin (ZOCOR) 80 MG tablet Take 1 tablet (80 mg total) by mouth at bedtime.  . vitamin B-12 (CYANOCOBALAMIN) 1000 MCG tablet Take 1,000 mcg by mouth daily.  . vitamin C (ASCORBIC ACID) 500 MG tablet Take 500 mg by mouth daily.  . vitamin E 400 UNIT capsule Take 400 Units by mouth daily.  . [DISCONTINUED] oxyCODONE (OXY IR/ROXICODONE) 5 MG immediate release tablet Take 2.5 mg by mouth every 6 (six) hours as needed for  severe pain.   No facility-administered encounter medications on file as of 03/12/2018.     Review of Systems  Unable to obtain due to dementia    Immunization History  Administered Date(s) Administered  . Influenza, Seasonal, Injecte, Preservative Fre 06/27/2014  . Influenza,inj,Quad PF,6+ Mos 06/27/2014  . Pneumococcal Polysaccharide-23 06/27/2014  . Tdap 06/17/2014   Pertinent  Health Maintenance Due  Topic Date Due  .  PNA vac Low Risk Adult (2 of 2 - PCV13) 06/28/2015  . INFLUENZA VACCINE  04/10/2018   Fall Risk  03/11/2018 06/14/2011  Falls in the past year? Yes -  Number falls in past yr: 2 or more -  Injury with Fall? No -  Risk for fall due to : - Impaired balance/gait;Other (Comment)  Risk for fall due to: Comment - weakness      Vitals:   03/12/18 1241  BP: (!) 142/70  Pulse: 64  Resp: 20  Temp: 97.9 F (36.6 C)  TempSrc: Oral  SpO2: 98%  Weight: 107 lb 9.6 oz (48.8 kg)  Height: 5\' 9"  (1.753 m)   Body mass index is 15.89 kg/m.  Physical Exam  GENERAL APPEARANCE:  In no acute distress. MOUTH and THROAT: Lips are without lesions. Oral mucosa is moist and without lesions.  RESPIRATORY: Breathing is even & unlabored, BS CTAB CARDIAC: RRR, no murmur,no extra heart sounds, BLE2-3+ edema, +right chest pacemaker GI: Abdomen soft, normal BS, no masses, no tenderness EXTREMITIES:  Able to move X 4 extremities NEURO:  Disoriented X 3  PSYCHIATRIC:  Affect and behavior are appropriate  Labs reviewed: Recent Labs    02/07/18 0535  02/09/18 0448 02/12/18 0458 02/14/18 0924  NA 148*   < > 136 139 137  K 3.7   < > 3.5 4.6 3.9  CL 114*   < > 105 105 103  CO2 25   < > 22 28 28   GLUCOSE 127*   < > 85 107* 109*  BUN 24*   < > 21* 44* 20  CREATININE 0.90   < > 0.65 0.97 0.70  CALCIUM 8.8*   < > 7.9* 8.6* 8.1*  MG 2.1  --   --   --   --    < > = values in this interval not displayed.   Recent Labs    02/06/18 2333  AST 30  ALT 16*  ALKPHOS 79  BILITOT 0.9  PROT 6.5  ALBUMIN 3.2*   Recent Labs    02/08/18 0526 02/09/18 0448 02/12/18 0458  WBC 6.5 9.2 9.8  NEUTROABS 5.7 7.7 8.2*  HGB 12.7* 12.0* 11.6*  HCT 39.8 36.5* 35.7*  MCV 94.1 91.5 92.7  PLT 292 353 396   Lab Results  Component Value Date   TSH 7.208 (H) 02/07/2018   Lab Results  Component Value Date   HGBA1C 6.0 (H) 06/26/2014   No results found for: CHOL, HDL, LDLCALC, LDLDIRECT, TRIG, CHOLHDL  Significant  Diagnostic Results in last 30 days:  Ct Head Wo Contrast  Result Date: 02/22/2018 CLINICAL DATA:  Fall, head trauma EXAM: CT HEAD WITHOUT CONTRAST TECHNIQUE: Contiguous axial images were obtained from the base of the skull through the vertex without intravenous contrast. COMPARISON:  02/13/2018 FINDINGS: Brain: Advanced atrophy and chronic small vessel disease throughout the deep white matter. Associated ventriculomegaly. No acute intracranial abnormality. Specifically, no hemorrhage, hydrocephalus, mass lesion, acute infarction, or significant intracranial injury. Vascular: No hyperdense vessel or unexpected calcification. Skull: No acute calvarial abnormality. Sinuses/Orbits: Visualized  paranasal sinuses and mastoids clear. Orbital soft tissues unremarkable. Other: None IMPRESSION: No acute intracranial abnormality. Atrophy, chronic microvascular disease. Electronically Signed   By: Charlett NoseKevin  Dover M.D.   On: 02/22/2018 08:39   Ct Head Wo Contrast  Result Date: 02/13/2018 CLINICAL DATA:  Dementia, increased confusion today, altered level of consciousness EXAM: CT HEAD WITHOUT CONTRAST TECHNIQUE: Contiguous axial images were obtained from the base of the skull through the vertex without intravenous contrast. Sagittal and coronal MPR images reconstructed from axial data set. COMPARISON:  02/06/2018 FINDINGS: Brain: Generalized atrophy. Normal ventricular morphology. No midline shift or mass effect. Small vessel chronic ischemic changes of deep cerebral white matter. No intracranial hemorrhage, mass lesion, evidence of acute infarction, or extra-axial fluid collection. Vascular: Atherosclerotic calcification of internal carotid and vertebral arteries at skull base. No hyperdense vessels. Skull: Intact Sinuses/Orbits: Visualized paranasal sinuses and mastoid air cells clear Other: N/A IMPRESSION: Atrophy with small vessel chronic ischemic changes of deep cerebral white matter. No acute intracranial abnormalities.  Electronically Signed   By: Ulyses SouthwardMark  Boles M.D.   On: 02/13/2018 15:08   Dg Swallowing Func-speech Pathology  Result Date: 02/13/2018 Objective Swallowing Evaluation: Type of Study: MBS-Modified Barium Swallow Study  Patient Details Name: Moody BruinsWilliam C Keeven MRN: [id number] Date of Birth: [birth year] Today's Date: 02/13/2018 Time: SLP Start Time (ACUTE ONLY): 1420 -SLP Stop Time (ACUTE ONLY): 1450 SLP Time Calculation (min) (ACUTE ONLY): 30 min Past Medical History: Past Medical History: Diagnosis Date . Atrial fibrillation (HCC)  . CAD (coronary artery disease)  . Compression fracture 2015 . Dyslipidemia  . Hypertension  . Hypothyroidism  . Sinus node dysfunction (HCC)   St.Jude pacemaker 06/05/2011 . Stented coronary artery 1998 Past Surgical History: Past Surgical History: Procedure Laterality Date . CARDIAC CATHETERIZATION  03/07/2000  100% CX,widely patent stent in RCA . CARDIAC CATHETERIZATION  03/26/2007  100% CX,widely patnet stent in RCA . CATARACT EXTRACTION, BILATERAL  2011 . CHEST TUBE INSERTION  06/06/11  right iatrogenic pneumothorax . CORONARY ANGIOPLASTY WITH STENT PLACEMENT  12/30/1996  stent RCA . NM MYOVIEW LTD  03/04/2007  High risk,mild-mod anterolateral/inferolateral ischemia . PACEMAKER INSERTION  06/05/11  St.Jude Accent DR . patch perforated duodenal ulcer  05/28/11  Cheree DittoGraham patch HPI: 84 yo male [facility] - PMH + for dementia, Afib, HTN, UTI, sodium levels 147, CAD.  Pt found to have left basilar ATX - concerning for pna per imaging study.  Swallow eval completed two days prior and diet was changed to puree/thin.  Today pt seen to assess po tolerance.  Subjective: pt awake in chair, talkative but disoriented Assessment / Plan / Recommendation CHL IP CLINICAL IMPRESSIONS 02/13/2018 Clinical Impression Pt presents with mild oral and severe pharyngeal dysphagia with sensorimotor deficits. Delay in swallow reflex noted with liquids triggering at pyriform sinus.  Decreased epigllotic  deflection/laryngeal elevation/closure results in vallecular more than pyriform sinus residuals .  Chin tuck posture did not improve airway protection.  Small single boluses of nectar were not aspirated but large sequential boluses were grossly aspirated.  Pt does not sense pharyngeal residuals and does not swallow on command to help decrease. Use of tracely filled and/or dry spoon to tongue did not elicit dry swallow.  Pt with lingual pumping with puree due to oral discoordination.  If pt is to eat - it should be with accepted aspiration risks.  Recommend dys3/nectar *allowing tsps thin water between meals* to help maximize comfort.  SLP Visit Diagnosis Dysphagia, oropharyngeal phase (R13.12);Dysphagia, pharyngoesophageal phase (  R13.14) Attention and concentration deficit following -- Frontal lobe and executive function deficit following -- Impact on safety and function Severe aspiration risk;Risk for inadequate nutrition/hydration;Moderate aspiration risk   CHL IP TREATMENT RECOMMENDATION 02/13/2018 Treatment Recommendations Therapy as outlined in treatment plan below   Prognosis 02/13/2018 Prognosis for Safe Diet Advancement Guarded Barriers to Reach Goals Other (Comment);Time post onset;Severity of deficits;Cognitive deficits Barriers/Prognosis Comment -- CHL IP DIET RECOMMENDATION 02/13/2018 SLP Diet Recommendations NPO;Other (Comment) Liquid Administration via Cup Medication Administration Crushed with puree Compensations Slow rate;Small sips/bites Postural Changes Seated upright at 90 degrees;Remain semi-upright after after feeds/meals (Comment)   CHL IP OTHER RECOMMENDATIONS 02/13/2018 Recommended Consults -- Oral Care Recommendations Oral care before and after PO Other Recommendations Order thickener from pharmacy   CHL IP FOLLOW UP RECOMMENDATIONS 02/13/2018 Follow up Recommendations (No Data)   CHL IP FREQUENCY AND DURATION 02/13/2018 Speech Therapy Frequency (ACUTE ONLY) min 1 x/week Treatment Duration 1 week       CHL IP ORAL PHASE 02/13/2018 Oral Phase Impaired Oral - Pudding Teaspoon -- Oral - Pudding Cup -- Oral - Honey Teaspoon -- Oral - Honey Cup -- Oral - Nectar Teaspoon Premature spillage;Delayed oral transit Oral - Nectar Cup Delayed oral transit;Premature spillage Oral - Nectar Straw Delayed oral transit;Premature spillage Oral - Thin Teaspoon Delayed oral transit;Premature spillage Oral - Thin Cup Delayed oral transit;Premature spillage Oral - Thin Straw Delayed oral transit;Premature spillage Oral - Puree Delayed oral transit;Premature spillage Oral - Mech Soft Delayed oral transit;Premature spillage Oral - Regular -- Oral - Multi-Consistency -- Oral - Pill -- Oral Phase - Comment --  CHL IP PHARYNGEAL PHASE 02/13/2018 Pharyngeal Phase Impaired Pharyngeal- Pudding Teaspoon -- Pharyngeal -- Pharyngeal- Pudding Cup -- Pharyngeal -- Pharyngeal- Honey Teaspoon -- Pharyngeal -- Pharyngeal- Honey Cup -- Pharyngeal -- Pharyngeal- Nectar Teaspoon Reduced pharyngeal peristalsis;Reduced epiglottic inversion;Reduced laryngeal elevation;Reduced airway/laryngeal closure;Delayed swallow initiation-pyriform sinuses;Pharyngeal residue - valleculae Pharyngeal -- Pharyngeal- Nectar Cup Delayed swallow initiation-pyriform sinuses;Delayed swallow initiation-vallecula;Reduced pharyngeal peristalsis;Reduced epiglottic inversion;Reduced laryngeal elevation;Reduced airway/laryngeal closure;Reduced tongue base retraction;Moderate aspiration;Penetration/Aspiration during swallow;Penetration/Apiration after swallow;Pharyngeal residue - valleculae Pharyngeal Material enters airway, passes BELOW cords and not ejected out despite cough attempt by patient;Material enters airway, passes BELOW cords without attempt by patient to eject out (silent aspiration) Pharyngeal- Nectar Straw Reduced epiglottic inversion;Reduced laryngeal elevation;Reduced airway/laryngeal closure;Reduced tongue base retraction;Penetration/Aspiration during  swallow;Penetration/Apiration after swallow Pharyngeal Material enters airway, passes BELOW cords without attempt by patient to eject out (silent aspiration) Pharyngeal- Thin Teaspoon Delayed swallow initiation-pyriform sinuses;Reduced airway/laryngeal closure;Reduced pharyngeal peristalsis;Reduced epiglottic inversion;Reduced tongue base retraction;Pharyngeal residue - valleculae;Pharyngeal residue - pyriform Pharyngeal -- Pharyngeal- Thin Cup -- Pharyngeal -- Pharyngeal- Thin Straw Delayed swallow initiation-pyriform sinuses;Penetration/Aspiration during swallow;Penetration/Apiration after swallow;Reduced pharyngeal peristalsis;Reduced epiglottic inversion;Reduced laryngeal elevation;Reduced airway/laryngeal closure;Reduced tongue base retraction;Pharyngeal residue - valleculae;Pharyngeal residue - pyriform Pharyngeal Material enters airway, passes BELOW cords without attempt by patient to eject out (silent aspiration);Material enters airway, passes BELOW cords and not ejected out despite cough attempt by patient Pharyngeal- Puree Delayed swallow initiation-vallecula;Reduced pharyngeal peristalsis;Reduced epiglottic inversion;Reduced laryngeal elevation;Reduced airway/laryngeal closure;Reduced tongue base retraction;Pharyngeal residue - valleculae Pharyngeal -- Pharyngeal- Mechanical Soft Delayed swallow initiation-vallecula;Reduced airway/laryngeal closure;Reduced pharyngeal peristalsis;Reduced epiglottic inversion;Reduced laryngeal elevation;Reduced tongue base retraction Pharyngeal -- Pharyngeal- Regular -- Pharyngeal -- Pharyngeal- Multi-consistency -- Pharyngeal -- Pharyngeal- Pill -- Pharyngeal -- Pharyngeal Comment pt with secretion penetration/aspiration, chin tuck posture did not prevent aspiration  CHL IP CERVICAL ESOPHAGEAL PHASE 02/13/2018 Cervical Esophageal Phase Impaired Pudding Teaspoon -- Pudding Cup -- Honey Teaspoon -- Honey Cup -- [employer]el  Teaspoon -- Nectar Cup -- Nectar Straw -- Thin Teaspoon --  Thin Cup -- Thin Straw -- Puree -- Mechanical Soft -- Regular -- Multi-consistency -- Pill -- Cervical Esophageal Comment appearance of prominent cricopharyngeus observed with liquids, did not impair barium flow No flowsheet data found. Chales Abrahams 02/13/2018, 4:24 PM  Donavan Burnet, MS [facility] [phone]              Assessment/Plan  1. Hypothyroidism, unspecified type - continue Levothyroxine 100 mcg 1 tab daily, check tsh Lab Results  Component Value Date   TSH 7.208 (H) 02/07/2018     2. Hyperlipidemia, unspecified hyperlipidemia type - continue Simvastatin 80 mg 1 tab Q HS   3. Adult failure to thrive - .Body mass index is 15.89 kg/m. continue Magic cup BID and multivitamins with minerals daily    4. Bilateral lower extremity edema - right big toe is tender and has BLE edema 2-3+, will check BLE venous ultrasound, will check CBC, BMP and uric acid to rule out gout   5. Alzheimer's disease of other onset with behavioral disturbance - continue supportive care, fall precautions    Family/ staff Communication: Discussed plan of care with resident and charge nurse.  Labs/tests ordered:  CBC, BMP, bilateral LE venous doppler ultrasound, tsh  Goals of care:   Long-term care.    Kenard Gower, NP [facility] [phone] (Monday-Friday 8:00 a.m. - 5:00 p.m.) [phone] (after hours)

## 2018-03-17 LAB — CBC AND DIFFERENTIAL
HCT: 31 — AB (ref 41–53)
Hemoglobin: 10.2 — AB (ref 13.5–17.5)
NEUTROS ABS: 8
Platelets: 398 (ref 150–399)
WBC: 9.6

## 2018-03-18 ENCOUNTER — Encounter: Payer: Self-pay | Admitting: Internal Medicine

## 2018-03-18 ENCOUNTER — Non-Acute Institutional Stay (SKILLED_NURSING_FACILITY): Payer: Medicare Other | Admitting: Internal Medicine

## 2018-03-18 DIAGNOSIS — F0281 Dementia in other diseases classified elsewhere with behavioral disturbance: Secondary | ICD-10-CM

## 2018-03-18 DIAGNOSIS — G308 Other Alzheimer's disease: Secondary | ICD-10-CM | POA: Diagnosis not present

## 2018-03-18 DIAGNOSIS — D649 Anemia, unspecified: Secondary | ICD-10-CM

## 2018-03-18 DIAGNOSIS — K265 Chronic or unspecified duodenal ulcer with perforation: Secondary | ICD-10-CM

## 2018-03-18 DIAGNOSIS — R9389 Abnormal findings on diagnostic imaging of other specified body structures: Secondary | ICD-10-CM

## 2018-03-18 DIAGNOSIS — D72829 Elevated white blood cell count, unspecified: Secondary | ICD-10-CM

## 2018-03-18 NOTE — Progress Notes (Signed)
    NURSING HOME LOCATION:  [facility] ROOM NUMBER:  116-A  CODE STATUS:  DNR  PCP:  Pecola LawlessHopper, Ezreal F, MD  [address] [place] [id number]  This is a nursing facility follow up for specific acute issue of possible HCPNA.  Interim medical record and care since [facility] was updated with review of diagnostic studies and change in clinical status since last visit were documented.  HPI: The patient was seen in routine follow-up for chronic medical issues 03/12/18. These included bilateral lower extremity edema. The right large toe was tender. Evaluation included CBC, BMP, uric acid, and venous ultrasound. CBC revealed leukocytosis prompting chest x-ray. Specifically white count was 11,100. Uric acid was normal. Mobile imaging suggested left-sided pneumonia. Film was personally reviewed. It revealed airspace disease bilaterally, worse on the left. There is also right hemidiaphragm elevation not present on the [facility] portable x-ray 02/07/18. That film did suggest airspace disease in the left mid to lower lung fields but no right-sided infiltrates. A follow-up CBC and differential was done 7/8 which revealed a slight left shift with a normal white count. It also revealed a stable anemia with hemoglobin 10.2 hematocrit 31.  He has PMH of "patched" duodenal ulcer & is on prn NSAIDs (600 mg ibuprofen q 6 hrs).  Review of systems: Dementia precluded completion. Dementia is so severe he responded verbally in a babbling fashion with no relationship to the question. He made the comment "I came in here because not full". In addition to his dementia he is profoundly hard of hearing. He kept repeating "I'm 89 going on 89". When I asked his DOB he said August [birth year] but could not give me the year. He seemed to deny any cardiac,extrinsic, URI or LRT symptoms.  Physical exam:  Pertinent or positive findings: He is grossly cachectic with temporal wasting as well as wasting of the  limbs. Pattern alopecia is present. Hair & person disheveled.He is markedly hard of hearing. Auscultation of the chest could not be completed as he talked incessantly in a loud voice as I tried to auscultate breath and heart sounds. He has hyperpigmented edematous changes of the feet with deformity of the toenails. He has irregular hyperpigmentation over the forearms.Diffusely weak; unable to stand up from sitting position w/o aid.  General appearance: no apparent acute distress, increased work of breathing Lymphatic: No lymphadenopathy about the head, neck, axilla. Eyes: No conjunctival inflammation or lid edema is present. There is no scleral icterus. Ears:  External ear exam shows no significant lesions or deformities.   Nose:  External nasal examination shows no deformity or inflammation. Nasal mucosa are pink and moist without lesions, exudates Oral exam:  Lips and gums are healthy appearing.  Neck:  No thyromegaly, masses, tenderness noted.    Abdomen is soft and nontender with no organomegaly, hernias, masses. GU: Deferred  Extremities:  No cyanosis, clubbing Neurologic exam : Balance, Rhomberg, finger to nose testing could not be completed due to clinical state Skin: Warm & dry  No significant rash.  Assessment:1) leukocytosis, resolved 2) abnormal chest x-ray suggesting diffuse, patchy bilateral airspace disease, greater on the left. This is asymptomatic and may represent bronchiectasis & secretion retention. In the absence of symptoms or signs of communicable disease, he will simply be monitored

## 2018-03-18 NOTE — Assessment & Plan Note (Addendum)
03/18/18 Severe end stage dementia , not oriented 3

## 2018-03-19 ENCOUNTER — Encounter: Payer: Self-pay | Admitting: Internal Medicine

## 2018-03-19 DIAGNOSIS — D649 Anemia, unspecified: Secondary | ICD-10-CM | POA: Insufficient documentation

## 2018-03-19 NOTE — Patient Instructions (Signed)
See assessment and plan under each diagnosis in the problem list and acutely for this visit 

## 2018-03-19 NOTE — Assessment & Plan Note (Addendum)
D/C high dose prn NSAIDS Tylenol prn pain

## 2018-03-25 ENCOUNTER — Encounter: Payer: Self-pay | Admitting: Internal Medicine

## 2018-03-25 ENCOUNTER — Non-Acute Institutional Stay (SKILLED_NURSING_FACILITY): Payer: Medicare Other | Admitting: Internal Medicine

## 2018-03-25 DIAGNOSIS — J690 Pneumonitis due to inhalation of food and vomit: Secondary | ICD-10-CM

## 2018-03-25 NOTE — Assessment & Plan Note (Addendum)
Mobile imaging here at the facility revealed patchy bilateral infiltrates Speech Therapy dietary recommendations must be followed

## 2018-03-25 NOTE — Progress Notes (Signed)
    NURSING HOME LOCATION:  [facility] ROOM NUMBER:  116-A  CODE STATUS:  DNR  PCP:  Pecola LawlessHopper, Declyn F, MD  [address] [place] [address]  This is a nursing facility follow up for specific acute issue of  cough.  Interim medical record and care since last [facility] visit was updated with review of diagnostic studies and change in clinical status since last visit were documented. Chart review states that he smoked only one year.  HPI: Nursing staff notes intermittent nonproductive cough; auscultation revealed diffuse rales. Patient was afebrile and had O2 sats of 94% on room air. He has history of coronary artery disease and atrial fibrillation but no pulmonary diagnoses. [facility] chest x-ray 02/07/18 personally reviewed. This revealed ill-defined infiltrate left lower lobe suggestive of pneumonia. The aorta was uncoiled mimicking an aneurysm. Right hemidiaphragm is chronically elevated.  Mobile imaging 03/15/18 at the facility also personally reviewed  reveals bilateral patchy infiltrates without definitive consolidation. These radiographic findings are suggestive of aspiration as he has documented mild oral and severe pharyngeal dysphagia with severe aspiration risk as per speech therapy evaluation. CBC 7/8 revealed normal white count with stable anemia.  Review of systems: Dementia invalidated responses. He can provide no meaningful history.Responses are garbled & unintelligible.  Physical exam:  Pertinent or positive findings: He is profoundly cachectic with diffuse muscle wasting. He is profoundly weak and unable to sit up without help. Partially eaten meal tray at the bedside despite instructions for aspiration prevention on the wall above head of bed. Specifically Speech Therapy mandated he go to the cafeteria for all meals in a wheelchair. He is to be on honey, mechanical soft diet.  Teeth are malaligned and coated. Heart sounds are distant. He has rublike  rhonchi anteriorly. He has coarse scattered rhonchi posteriorly. Limb atrophy is present. 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 Pedal pulses decreased. Scattered bruising over the forearms. Scalp waxy keratotic lesions present.  General appearance: no acute distress, increased work of breathing is present.   Lymphatic: No lymphadenopathy about the head, neck, axilla. Eyes: No conjunctival inflammation or lid edema is present. There is no scleral icterus. Ears:  External ear exam shows no significant lesions or deformities.   Nose:  External nasal examination shows no deformity or inflammation. Nasal mucosa are pink and moist without lesions, exudates Oral exam:  Lips and gums are healthy appearing.  Neck:  No thyromegaly, masses, tenderness noted.    Heart:   without gallop, murmur, click, rub .  Abdomen: Bowel sounds are normal. Abdomen is soft and nontender with no organomegaly, hernias, masses. GU: Deferred  Extremities:  No cyanosis, clubbing, edema  Neurologic exam : Balance, Rhomberg, finger to nose testing could not be completed due to clinical state Skin: Warm & dry w/o tenting. No significant  rash.  See summary under each active problem in the Problem List with associated updated therapeutic plan

## 2018-03-26 NOTE — Patient Instructions (Signed)
See assessment and plan under each diagnosis in the problem list and acutely for this visit 

## 2018-03-28 ENCOUNTER — Encounter: Payer: Self-pay | Admitting: Adult Health

## 2018-03-28 ENCOUNTER — Non-Acute Institutional Stay (SKILLED_NURSING_FACILITY): Payer: Medicare Other | Admitting: Adult Health

## 2018-03-28 DIAGNOSIS — R627 Adult failure to thrive: Secondary | ICD-10-CM

## 2018-03-28 DIAGNOSIS — E039 Hypothyroidism, unspecified: Secondary | ICD-10-CM | POA: Diagnosis not present

## 2018-03-28 DIAGNOSIS — J189 Pneumonia, unspecified organism: Secondary | ICD-10-CM | POA: Diagnosis not present

## 2018-03-28 NOTE — Progress Notes (Signed)
Location:  [facility] Room Number: 116-A Place of Service:  SNF (31) Provider:  Kenard GowerMedina-Vargas, Shamel Galyean, NP  Patient Care Team: Pecola LawlessHopper, Antionio F, MD as PCP - General (Internal Medicine)  Extended Emergency Contact Information Primary Emergency Contact: Dolan AmenM[place]ows,[place]tanice  [place] Home Phone: [phone] Relation: Neighbor  Code Status:  DNR  Goals of care: Advanced Directive information Advanced Directives 03/28/2018  Does Patient Have a Medical Advance Directive? Yes  Type of Advance Directive Out of facility DNR (pink MOST or yellow form)  Does patient want to make changes to medical advance directive? No - Patient declined  Copy of Healthcare Power of Attorney in Chart? -  Would patient like information on creating a medical advance directive? No - Patient declined     Chief Complaint  Patient presents with  . Acute Visit    Patient seen due to persistent cough and pneumonia demonstrated on chest x-ray in SNF.    HPI:  Pt is a 84 y.o. male seen today for an acute visit due to persistent cough and chest x-ray demonstrating an "acute pneumonia in the left lower lobe." He is a [facility] care [facility].  He has a PMH of coronary disease, chronic atrial fibrillation, sinus node dysfunction, hypothyroidism, essential hypertension, and dyslipidemia. He was seen in his room today with sitter at bedside. He was seen eating his lunch. No noted coughing while eating. No fever. According to son, he has productive cough with yellowish phlegm.   Past Medical History:  Diagnosis Date  . Atrial fibrillation (HCC)   . CAD (coronary artery disease)   . Compression fracture 2015  . Dyslipidemia   . Hypertension   . Hypothyroidism   . Sinus node dysfunction (HCC)    St.Jude pacemaker 06/05/2011  . Stented coronary artery 1998   Past Surgical History:  Procedure Laterality Date  . CARDIAC CATHETERIZATION   03/07/2000   100% CX,widely patent stent in RCA  . CARDIAC CATHETERIZATION  03/26/2007   100% CX,widely patnet stent in RCA  . CATARACT EXTRACTION, BILATERAL  2011  . CHEST TUBE INSERTION  06/06/11   right iatrogenic pneumothorax  . CORONARY ANGIOPLASTY WITH STENT PLACEMENT  12/30/1996   stent RCA  . NM MYOVIEW LTD  03/04/2007   High risk,mild-mod anterolateral/inferolateral ischemia  . PACEMAKER INSERTION  06/05/11   St.Jude Accent DR  . patch perforated duodenal ulcer  05/28/11   Cheree DittoGraham patch    Allergies  Allergen Reactions  . Ibuprofen     PMH perforated ulcer All NSAIDs contraindicated    Outpatient Encounter Medications as of 03/28/2018  Medication Sig  . acetaminophen (TYLENOL) 500 MG tablet Take 500 mg by mouth every 6 (six) hours as needed for moderate pain.  . fish oil-omega-3 fatty acids 1000 MG capsule Take 2 g by mouth daily.  Marland Kitchen. ipratropium-albuterol (DUONEB) 0.5-2.5 (3) MG/3ML SOLN Take 3 mLs by nebulization every 6 (six) hours as needed.  . Nutritional Supplements (NUTRITIONAL SUPPLEMENT PO) Take 1 each by mouth 2 (two) times daily. Magic Cup  . simvastatin (ZOCOR) 80 MG tablet Take 1 tablet (80 mg total) by mouth at bedtime.  . vitamin B-12 (CYANOCOBALAMIN) 1000 MCG tablet Take 1,000 mcg by mouth daily.  . [DISCONTINUED] levothyroxine (SYNTHROID, LEVOTHROID) 100 MCG tablet Take 100 mcg by mouth daily before breakfast.  . [DISCONTINUED] Multiple Vitamin (MULTIVITAMIN WITH MINERALS) TABS Take 1 tablet by mouth daily.  . [DISCONTINUED] vitamin C (ASCORBIC ACID) 500 MG tablet Take  500 mg by mouth daily.  . [DISCONTINUED] vitamin E 400 UNIT capsule Take 400 Units by mouth daily.   No facility-administered encounter medications on file as of 03/28/2018.     Review of Systems Unable to obtain due to dementia    Immunization History  Administered Date(s) Administered  . Influenza, Seasonal, Injecte, Preservative Fre 06/27/2014  . Influenza,inj,Quad PF,6+ Mos 06/27/2014    . Pneumococcal Conjugate-13 03/12/2018  . Pneumococcal Polysaccharide-23 06/27/2014  . Tdap 06/17/2014   Pertinent  Health Maintenance Due  Topic Date Due  . INFLUENZA VACCINE  04/10/2018  . PNA vac Low Risk Adult  Completed   Fall Risk  03/11/2018 06/14/2011  Falls in the past year? Yes -  Number falls in past yr: 2 or more -  Injury with Fall? No -  Risk for fall due to : - Impaired balance/gait;Other (Comment)  Risk for fall due to: Comment - weakness      Vitals:   03/28/18 1431  BP: 123/70  Pulse: 62  Resp: 18  Temp: 97.6 F (36.4 C)  TempSrc: Oral  SpO2: 95%  Weight: 99 lb 3.2 oz (45 kg)  Height: 5\' 9"  (1.753 m)   Body mass index is 14.65 kg/m.  Physical Exam  GENERAL APPEARANCE: In no acute distress.  MOUTH and THROAT: Lips are without lesions. Oral mucosa is moist and without lesions. Tongue is normal in shape, size, and color and without lesions RESPIRATORY: Breathing is even & unlabored,rales on left lower lung field  CARDIAC: RRR, no murmur,no extra heart sounds, no edema GI: Abdomen soft, normal BS, no masses, no tenderness EXTREMITIES:  No cyanosis, able to move X 4 extremities PSYCHIATRIC: Disoriented X 3. Affect and behavior are appropriate   Labs reviewed: Recent Labs    02/07/18 0535  02/09/18 0448 02/12/18 0458 02/14/18 0924 03/12/18  NA 148*   < > 136 139 137 144  K 3.7   < > 3.5 4.6 3.9 3.7  CL 114*   < > 105 105 103  --   CO2 25   < > 22 28 28   --   GLUCOSE 127*   < > 85 107* 109*  --   BUN 24*   < > 21* 44* 20 21  CREATININE 0.90   < > 0.65 0.97 0.70 0.7  CALCIUM 8.8*   < > 7.9* 8.6* 8.1*  --   MG 2.1  --   --   --   --   --    < > = values in this interval not displayed.   Recent Labs    02/06/18 2333  AST 30  ALT 16*  ALKPHOS 79  BILITOT 0.9  PROT 6.5  ALBUMIN 3.2*   Recent Labs    02/08/18 0526 02/09/18 0448 02/12/18 0458 03/12/18 03/17/18  WBC 6.5 9.2 9.8 11.1 9.6  NEUTROABS 5.7 7.7 8.2* 10 8  HGB 12.7* 12.0*  11.6* 10.1* 10.2*  HCT 39.8 36.5* 35.7* 32* 31*  MCV 94.1 91.5 92.7  --   --   PLT 292 353 396 378 398   Lab Results  Component Value Date   TSH 7.208 (H) 02/07/2018   Lab Results  Component Value Date   HGBA1C 6.0 (H) 06/26/2014    Assessment/Plan  1. HCAP (healthcare-associated pneumonia) - chest x-ray showed acute pneumonia in the left lower lobe, will continue Duoneb Q 6 hours till 03/30/18 start Rocephin 1 gm IM Q D X 7 days, Florastor 250 mg 1  capsule, BID X 10 days, Guaifenesin 100 mg/5 ml give 10 ml PO Q 6AM, 2PM, and 10 PM X 1 week for cough   2. Adult failure to thrive - has poor appetite, will start Eldertonic 15 ml PO BID to increase appetite, increase Magic cup from BID to TID   3. Hypothyroidism, unspecified type - noted Levothyroxine was discontinued and reason is not clear, will re-start Levothyroxine 100 mcg daily    Family/ staff Communication:  Discussed plan of care with resident.  Labs/tests ordered:  TSH, CMP, CBC  Goals of care:   Long-term care.   Kenard Gower, NP [facility] and Adult Medicine [phone] (Monday-Friday 8:00 a.m. - 5:00 p.m.) [phone] (after hours)

## 2018-03-28 NOTE — Progress Notes (Signed)
This encounter was created in error - please disregard.

## 2018-03-31 LAB — CBC AND DIFFERENTIAL
HCT: 32 — AB (ref 41–53)
HEMOGLOBIN: 10.2 — AB (ref 13.5–17.5)
NEUTROS ABS: 5
PLATELETS: 424 — AB (ref 150–399)
WBC: 6.4

## 2018-03-31 LAB — HEPATIC FUNCTION PANEL
ALT: 20 (ref 10–40)
AST: 28 (ref 14–40)
Alkaline Phosphatase: 155 — AB (ref 25–125)
Bilirubin, Total: 0.3

## 2018-03-31 LAB — BASIC METABOLIC PANEL
BUN: 18 (ref 4–21)
CREATININE: 0.7 (ref 0.6–1.3)
Glucose: 72
Potassium: 4 (ref 3.4–5.3)
Sodium: 143 (ref 137–147)

## 2018-03-31 LAB — TSH: TSH: 2.61 (ref 0.41–5.90)

## 2018-04-11 ENCOUNTER — Non-Acute Institutional Stay (SKILLED_NURSING_FACILITY): Payer: Medicare Other | Admitting: Adult Health

## 2018-04-11 ENCOUNTER — Encounter: Payer: Self-pay | Admitting: Adult Health

## 2018-04-11 DIAGNOSIS — F02818 Dementia in other diseases classified elsewhere, unspecified severity, with other behavioral disturbance: Secondary | ICD-10-CM

## 2018-04-11 DIAGNOSIS — E039 Hypothyroidism, unspecified: Secondary | ICD-10-CM

## 2018-04-11 DIAGNOSIS — R627 Adult failure to thrive: Secondary | ICD-10-CM | POA: Diagnosis not present

## 2018-04-11 DIAGNOSIS — F0281 Dementia in other diseases classified elsewhere with behavioral disturbance: Secondary | ICD-10-CM

## 2018-04-11 DIAGNOSIS — E785 Hyperlipidemia, unspecified: Secondary | ICD-10-CM | POA: Diagnosis not present

## 2018-04-11 DIAGNOSIS — G308 Other Alzheimer's disease: Secondary | ICD-10-CM

## 2018-04-11 NOTE — Progress Notes (Signed)
Location:  [facility] Room Number: 116 A Place of Service:  SNF (31) Provider:  Kenard Gower, NP  Patient Care Team: Pecola Lawless, MD as PCP - General (Internal Medicine)  Extended Emergency Contact Information Primary Emergency Contact: Dolan Amen [place] Home Phone: [phone] Relation: Neighbor  Code Status:  DNR  Goals of care: Advanced Directive information Advanced Directives 04/11/2018  Does Patient Have a Medical Advance Directive? Yes  Type of Advance Directive Out of facility DNR (pink MOST or yellow form)  Does patient want to make changes to medical advance directive? No - Patient declined  Copy of Healthcare Power of Attorney in Chart? -  Would patient like information on creating a medical advance directive? -  Pre-existing out of facility DNR order (yellow form or pink MOST form) Yellow form placed in chart (order not valid for inpatient use)     Chief Complaint  Patient presents with  . Medical Management of Chronic Issues    Resident is being seen for a routine visit.     HPI:  Pt is a 84 y.o. male seen today for medical management of chronic diseases. He has PMH of CAD, chronic atrial fibrillation, sinus node dysfunction, hypothyroidism, essential hypertension, and dyslipidemia. He was seen in his room today.  He would answer verbal inquiries but answers were not related to the questions.   He has been admitted to [facility] on 04/20/18 from a recent hospitalization for having falls without injuries.    Past Medical History:  Diagnosis Date  . Atrial fibrillation (HCC)   . CAD (coronary artery disease)   . Compression fracture 2015  . Dyslipidemia   . Hypertension   . Hypothyroidism   . Sinus node dysfunction (HCC)    St.Jude pacemaker 06/05/2011  . Stented coronary artery 1998   Past Surgical History:  Procedure Laterality Date  . CARDIAC CATHETERIZATION  03/07/2000    100% CX,widely patent stent in RCA  . CARDIAC CATHETERIZATION  03/26/2007   100% CX,widely patnet stent in RCA  . CATARACT EXTRACTION, BILATERAL  2011  . CHEST TUBE INSERTION  06/06/11   right iatrogenic pneumothorax  . CORONARY ANGIOPLASTY WITH STENT PLACEMENT  12/30/1996   stent RCA  . NM MYOVIEW LTD  03/04/2007   High risk,mild-mod anterolateral/inferolateral ischemia  . PACEMAKER INSERTION  06/05/11   St.Jude Accent DR  . patch perforated duodenal ulcer  05/28/11   Cheree Ditto patch    Allergies  Allergen Reactions  . Ibuprofen     PMH perforated ulcer All NSAIDs contraindicated    Outpatient Encounter Medications as of 04/11/2018  Medication Sig  . acetaminophen (TYLENOL) 500 MG tablet Take 500 mg by mouth every 6 (six) hours as needed for moderate pain.  . B Complex-Minerals (ELDERTONIC PO) Take 15 mLs by mouth 2 (two) times daily.  Marland Kitchen levothyroxine (SYNTHROID, LEVOTHROID) 100 MCG tablet Take 100 mcg by mouth daily before breakfast.  . memantine (NAMENDA) 5 MG tablet Take 5 mg by mouth 2 (two) times daily.  . Menthol, Topical Analgesic, (BIOFREEZE) 4 % GEL Apply to left heel every morning and at bedtime.  . Nutritional Supplements (NUTRITIONAL SUPPLEMENT PO) Take 1 each by mouth 3 (three) times daily. Magic Cup   . simvastatin (ZOCOR) 80 MG tablet Take 1 tablet (80 mg total) by mouth at bedtime.  . vitamin B-12 (CYANOCOBALAMIN) 1000 MCG tablet Take 1,000 mcg by mouth daily.  . [DISCONTINUED] fish oil-omega-3 fatty acids 1000 MG  capsule Take 2 g by mouth daily.  . [DISCONTINUED] ipratropium-albuterol (DUONEB) 0.5-2.5 (3) MG/3ML SOLN Take 3 mLs by nebulization every 6 (six) hours as needed.   No facility-administered encounter medications on file as of 04/11/2018.     Review of Systems Unable to obtain due to dementia    Immunization History  Administered Date(s) Administered  . Influenza, Seasonal, Injecte, Preservative Fre 06/27/2014  . Influenza,inj,Quad PF,6+ Mos 06/27/2014    . Pneumococcal Conjugate-13 03/12/2018  . Pneumococcal Polysaccharide-23 06/27/2014  . Tdap 06/17/2014   Pertinent  Health Maintenance Due  Topic Date Due  . INFLUENZA VACCINE  05/12/2018 (Originally 04/10/2018)  . PNA vac Low Risk Adult  Completed   Fall Risk  03/11/2018 06/14/2011  Falls in the past year? Yes -  Number falls in past yr: 2 or more -  Injury with Fall? No -  Risk for fall due to : - Impaired balance/gait;Other (Comment)  Risk for fall due to: Comment - weakness    Vitals:   04/11/18 1125  BP: 117/78  Pulse: 92  Resp: 18  Temp: (!) 97.3 F (36.3 C)  SpO2: 95%  Weight: 101 lb 9.6 oz (46.1 kg)  Height: 5\' 9"  (1.753 m)   Body mass index is 15 kg/m.  Physical Exam  GENERAL APPEARANCE:  In no acute distress.  SKIN:  Skin is warm and dry. MOUTH and THROAT: Lips are without lesions. Oral mucosa is moist and without lesions. Tongue is normal in shape, size, and color and without lesions RESPIRATORY: Breathing is even & unlabored, BS CTAB CARDIAC: Irregularly irregular, no murmur,no extra heart sounds, no edema GI: Abdomen soft, normal BS, no masses, no tenderness PSYCHIATRIC: Disoriented X 3. Affect and behavior are appropriate   Labs reviewed: Recent Labs    02/07/18 0535  02/09/18 0448 02/12/18 0458 02/14/18 0924 03/12/18 03/31/18  NA 148*   < > 136 139 137 144 143  K 3.7   < > 3.5 4.6 3.9 3.7 4.0  CL 114*   < > 105 105 103  --   --   CO2 25   < > 22 28 28   --   --   GLUCOSE 127*   < > 85 107* 109*  --   --   BUN 24*   < > 21* 44* 20 21 18   CREATININE 0.90   < > 0.65 0.97 0.70 0.7 0.7  CALCIUM 8.8*   < > 7.9* 8.6* 8.1*  --   --   MG 2.1  --   --   --   --   --   --    < > = values in this interval not displayed.   Recent Labs    02/06/18 2333 03/31/18  AST 30 28  ALT 16* 20  ALKPHOS 79 155*  BILITOT 0.9  --   PROT 6.5  --   ALBUMIN 3.2*  --    Recent Labs    02/08/18 0526 02/09/18 0448 02/12/18 0458 03/12/18 03/17/18 03/31/18  WBC 6.5  9.2 9.8 11.1 9.6 6.4  NEUTROABS 5.7 7.7 8.2* 10 8 5   HGB 12.7* 12.0* 11.6* 10.1* 10.2* 10.2*  HCT 39.8 36.5* 35.7* 32* 31* 32*  MCV 94.1 91.5 92.7  --   --   --   PLT 292 353 396 378 398 424*   Lab Results  Component Value Date   TSH 2.61 03/31/2018   Lab Results  Component Value Date   HGBA1C 6.0 (H) 06/26/2014  Assessment/Plan  1. Hyperlipidemia, unspecified hyperlipidemia type - continue Simvastatin 80 mg  1 tab Q HS   2. Hypothyroidism, unspecified type - continue Levothyroxine 100 mcg 1 tab daily Lab Results  Component Value Date   TSH 2.61 03/31/2018     3. Adult failure to thrive - Body mass index is 15 kg/m. continue Eldertonic 15 ml BID, Magic cup TID   4. Alzheimer's disease of other onset with behavioral disturbance - continue supportive care, fall precautions    Family/ staff Communication: Discussed plan of care with patient.  Labs/tests ordered:  None  Goals of care:   Short-term care   Kenard GowerMonina Medina-Vargas, NP [facility] and Adult [facility] [phone] (Monday-Friday 8:00 a.m. - 5:00 p.m.) [phone] (after hours)

## 2018-04-29 ENCOUNTER — Non-Acute Institutional Stay (SKILLED_NURSING_FACILITY): Payer: Medicare Other | Admitting: Internal Medicine

## 2018-04-29 ENCOUNTER — Encounter: Payer: Self-pay | Admitting: Internal Medicine

## 2018-04-29 DIAGNOSIS — T17908A Unspecified foreign body in respiratory tract, part unspecified causing other injury, initial encounter: Secondary | ICD-10-CM

## 2018-04-29 DIAGNOSIS — E039 Hypothyroidism, unspecified: Secondary | ICD-10-CM

## 2018-04-29 NOTE — Assessment & Plan Note (Signed)
Devin Clark suggests that he is getting L-thyroxine 100 mcg 2 pills each morning Dose will be changed to 1 pill each morning and TSH checked in 1 month

## 2018-04-29 NOTE — Patient Instructions (Signed)
See assessment and plan under each diagnosis in the problem list and acutely for this visit 

## 2018-04-29 NOTE — Progress Notes (Signed)
    NURSING HOME LOCATION:  [facility] ROOM NUMBER:  116-A  CODE STATUS:  DNR  PCP:  Pecola LawlessHopper, Toshiyuki F, MD  [address] [place] [address]  This is a nursing facility follow up for specific acute issue of aspiration.  Interim medical record and care since last [facility] visit was updated with review of diagnostic studies and change in clinical status since last visit were documented.  HPI: After eating lunch he was noted to be coughing and spitting up particles of food.  Tachycardia and tachypnea were documented.  He received DuoNeb with only partial response. He has been actively followed by Speech Therapy and has specific precautions related to meals.  He is on a honey thick pure protocol.  He is also supposed to be up and out of bed for meals. MAR reviewed;it appears he is receiving L-thyroxine 100 mcg TWO pills each morning.  TSH was 7.20 on 5/31.  Follow-up TSH was 2.61 on 03/31/2018.  Review of systems: patient unable to provide any history.  Physical exam:  Pertinent or positive findings: He is sitting in the wheelchair coughing nonproductively intermittently.  Keeps his eyes tightly closed.  He is tearing.  He is also diaphoretic. He resisted oral exam. He has a tachyarrhythmia which is irregular.  He has diffuse low-grade rales.  Limbs are atrophic & he appears cachectic.   Lymphatic: No lymphadenopathy about the head, neck, axilla. Nose:  External nasal examination shows no deformity or inflammation. Nasal mucosa are pink and moist without lesions, exudates Oral exam:  Lips and gums are healthy appearing. Neck:  No thyromegaly, masses, tenderness noted.    Heart:  No definite murmur, click, rub .  Abdomen: Bowel sounds are normal. Abdomen is soft and nontender with no organomegaly, hernias, masses. GU: Deferred  Extremities:  No cyanosis, clubbing, edema  Skin: Warm & dry w/o tenting. No significant lesions or rash.  Diagnosis: 1) clinical  aspiration Plan: PCR, aggressive pulmonary toilet, IV steroids and IV antibiotics

## 2018-05-05 ENCOUNTER — Non-Acute Institutional Stay (SKILLED_NURSING_FACILITY): Payer: Medicare Other | Admitting: Adult Health

## 2018-05-05 ENCOUNTER — Encounter: Payer: Self-pay | Admitting: Adult Health

## 2018-05-05 DIAGNOSIS — R627 Adult failure to thrive: Secondary | ICD-10-CM

## 2018-05-05 DIAGNOSIS — J189 Pneumonia, unspecified organism: Secondary | ICD-10-CM

## 2018-05-05 DIAGNOSIS — E039 Hypothyroidism, unspecified: Secondary | ICD-10-CM

## 2018-05-05 NOTE — Progress Notes (Signed)
Location:  [facility] Room Number: 121-A Place of Service:  SNF (31) Provider:  Kenard Gower, NP  Patient Care Team: Pecola Lawless, MD as PCP - General (Internal Medicine)  Extended Emergency Contact Information Primary Emergency Contact: Dolan Amen [place] Home Phone: [phone] Relation: Neighbor  Code Status:  DNR  Goals of care: Advanced Directive information Advanced Directives 04/11/2018  Does Patient Have a Medical Advance Directive? Yes  Type of Advance Directive Out of facility DNR (pink MOST or yellow form)  Does patient want to make changes to medical advance directive? No - Patient declined  Copy of Healthcare Power of Attorney in Chart? -  Would patient like information on creating a medical advance directive? -  Pre-existing out of facility DNR order (yellow form or pink MOST form) Yellow form placed in chart (order not valid for inpatient use)     Chief Complaint  Patient presents with  . Acute Visit    The patient is seen for treatment of pneumonia.      HPI:  Pt is a 84 y.o. male seen today for an acute visit for treatment of pneumonia.  He was prescribed IV antibiotics but continues to pull out his IV daily. Therapy needs to be changed to oral. He was seen in the room today. No reported fever. Chest x-ray done on 8/23 showed atelectatic changes/pneumonia on right lower lobe. He is a long-term care resident of [facility].  He has a PMH of CAD, chronic atrial fibrillation, sinus node dysfunction, hypothyroidism, essential hypertension, and dyslipidemia.     Past Medical History:  Diagnosis Date  . Atrial fibrillation (HCC)   . CAD (coronary artery disease)   . Compression fracture 2015  . Dyslipidemia   . Hypertension   . Hypothyroidism   . Sinus node dysfunction (HCC)    St.Jude pacemaker 06/05/2011  . Stented coronary artery 1998   Past Surgical History:  Procedure  Laterality Date  . CARDIAC CATHETERIZATION  03/07/2000   100% CX,widely patent stent in RCA  . CARDIAC CATHETERIZATION  03/26/2007   100% CX,widely patnet stent in RCA  . CATARACT EXTRACTION, BILATERAL  2011  . CHEST TUBE INSERTION  06/06/11   right iatrogenic pneumothorax  . CORONARY ANGIOPLASTY WITH STENT PLACEMENT  12/30/1996   stent RCA  . NM MYOVIEW LTD  03/04/2007   High risk,mild-mod anterolateral/inferolateral ischemia  . PACEMAKER INSERTION  06/05/11   St.Jude Accent DR  . patch perforated duodenal ulcer  05/28/11   Cheree Ditto patch    Allergies  Allergen Reactions  . Ibuprofen     PMH perforated ulcer All NSAIDs contraindicated    Outpatient Encounter Medications as of 05/05/2018  Medication Sig  . acetaminophen (TYLENOL) 500 MG tablet Take 500 mg by mouth every 6 (six) hours as needed for moderate pain.  . B Complex-Minerals (ELDERTONIC PO) Take 15 mLs by mouth 2 (two) times daily.  Marland Kitchen levothyroxine (SYNTHROID, LEVOTHROID) 100 MCG tablet Take 100 mcg by mouth daily before breakfast.  . memantine (NAMENDA) 10 MG tablet Take 10 mg by mouth 2 (two) times daily.  . Menthol, Topical Analgesic, (BIOFREEZE) 4 % GEL Apply to left heel every morning and at bedtime.  . Nutritional Supplements (NUTRITIONAL SUPPLEMENT PO) Take 1 each by mouth 3 (three) times daily. Magic Cup   . simvastatin (ZOCOR) 80 MG tablet Take 1 tablet (80 mg total) by mouth at bedtime.  . vitamin B-12 (CYANOCOBALAMIN) 1000 MCG tablet Take 1,000  mcg by mouth daily.   No facility-administered encounter medications on file as of 05/05/2018.     Review of Systems  GENERAL: No change in appetite, no fatigue, no weight changes, no fever, chills or weakness MOUTH and THROAT: Denies oral discomfort, gingival pain or bleeding RESPIRATORY: no cough, SOB, DOE, wheezing, hemoptysis CARDIAC: No chest pain, edema or palpitations GI: No abdominal pain, diarrhea, constipation, heart burn, nausea or vomiting GU: Denies dysuria,  frequency, hematuria, incontinence, or discharge PSYCHIATRIC: Denies feelings of depression or anxiety. No report of hallucinations, insomnia, paranoia, or agitation   Immunization History  Administered Date(s) Administered  . Influenza, Seasonal, Injecte, Preservative Fre 06/27/2014  . Influenza,inj,Quad PF,6+ Mos 06/27/2014  . Pneumococcal Conjugate-13 03/12/2018  . Pneumococcal Polysaccharide-23 06/27/2014  . Tdap 06/17/2014   Pertinent  Health Maintenance Due  Topic Date Due  . INFLUENZA VACCINE  05/12/2018 (Originally 04/10/2018)  . PNA vac Low Risk Adult  Completed   Fall Risk  03/11/2018 06/14/2011  Falls in the past year? Yes -  Number falls in past yr: 2 or more -  Injury with Fall? No -  Risk for fall due to : - Impaired balance/gait;Other (Comment)  Risk for fall due to: Comment - weakness    Vitals:   05/05/18 1628  BP: 128/64  Pulse: 88  Resp: 18  Temp: 97.8 F (36.6 C)  TempSrc: Oral  SpO2: 91%  Weight: 103 lb 9.6 oz (47 kg)  Height: 5\' 9"  (1.753 m)   Body mass index is 15.3 kg/m.  Physical Exam  GENERAL APPEARANCE:  In no acute distress. SKIN:  Skin is warm and dry.  MOUTH and THROAT: Lips are without lesions. Oral mucosa is moist and without lesions. Tongue is normal in shape, size, and color and without lesions RESPIRATORY: Breathing is even & unlabored, BS CTAB CARDIAC: RRR, no murmur,no extra heart sounds, no edema GI: Abdomen soft, normal BS, no masses, no tenderness EXTREMITIES:  Able to move X 4 extremities PSYCHIATRIC: Alert to self, disoriented to time and place. Affect and behavior are appropriate   Labs reviewed: Recent Labs    02/07/18 0535  02/09/18 0448 02/12/18 0458 02/14/18 0924 03/12/18 03/31/18  NA 148*   < > 136 139 137 144 143  K 3.7   < > 3.5 4.6 3.9 3.7 4.0  CL 114*   < > 105 105 103  --   --   CO2 25   < > 22 28 28   --   --   GLUCOSE 127*   < > 85 107* 109*  --   --   BUN 24*   < > 21* 44* 20 21 18   CREATININE 0.90   <  > 0.65 0.97 0.70 0.7 0.7  CALCIUM 8.8*   < > 7.9* 8.6* 8.1*  --   --   MG 2.1  --   --   --   --   --   --    < > = values in this interval not displayed.   Recent Labs    02/06/18 2333 03/31/18  AST 30 28  ALT 16* 20  ALKPHOS 79 155*  BILITOT 0.9  --   PROT 6.5  --   ALBUMIN 3.2*  --    Recent Labs    02/08/18 0526 02/09/18 0448 02/12/18 0458 03/12/18 03/17/18 03/31/18  WBC 6.5 9.2 9.8 11.1 9.6 6.4  NEUTROABS 5.7 7.7 8.2* 10 8 5   HGB 12.7* 12.0* 11.6* 10.1* 10.2* 10.2*  HCT 39.8 36.5* 35.7* 32* 31* 32*  MCV 94.1 91.5 92.7  --   --   --   PLT 292 353 396 378 398 424*   Lab Results  Component Value Date   TSH 2.61 03/31/2018   Lab Results  Component Value Date   HGBA1C 6.0 (H) 06/26/2014    Assessment/Plan  1. HCAP (healthcare-associated pneumonia) - will discontinue midline and IV medications since resident has been pulling out peripheral IV, will start Augmentin 875-125 mg 1 tab BID X 10 days, Florastor 250 mg 1 capsule BID X 13 days, Metronidazole 500 mg 1 tab TID X 7 days, Prednisone 20 mg 1 tab  5 days  2. Adult failure to thrive - Body mass index is 15.3 kg/m.  Continue Eldertonic 15 ml BID Last Weight  Most recent update: 05/05/2018  4:28 PM   Weight  47 kg (103 lb 9.6 oz)           3. Hypothyroidism - continue Levothyroxine 100 mcg 1 tab daily Lab Results  Component Value Date   TSH 2.61 03/31/2018       Family/ staff Communication: Discussed plan of care with resident and charge nurse.  Labs/tests ordered:  None  Goals of care:   Long-term care.   Kenard Gower, NP [facility] and Adult [facility] [phone] (Monday-Friday 8:00 a.m. - 5:00 p.m.) [phone] (after hours)

## 2018-05-08 ENCOUNTER — Encounter: Payer: Self-pay | Admitting: Internal Medicine

## 2018-05-08 ENCOUNTER — Non-Acute Institutional Stay (SKILLED_NURSING_FACILITY): Payer: Medicare Other | Admitting: Internal Medicine

## 2018-05-08 DIAGNOSIS — G308 Other Alzheimer's disease: Secondary | ICD-10-CM | POA: Diagnosis not present

## 2018-05-08 DIAGNOSIS — J690 Pneumonitis due to inhalation of food and vomit: Secondary | ICD-10-CM

## 2018-05-08 DIAGNOSIS — F0281 Dementia in other diseases classified elsewhere with behavioral disturbance: Secondary | ICD-10-CM | POA: Diagnosis not present

## 2018-05-08 DIAGNOSIS — F02818 Dementia in other diseases classified elsewhere, unspecified severity, with other behavioral disturbance: Secondary | ICD-10-CM

## 2018-05-08 NOTE — Assessment & Plan Note (Signed)
Combative behavior, kicking staff and refusing to have vital signs monitored or to take medications intermittently Ativan gel 0.25 topically 3 times daily as needed

## 2018-05-08 NOTE — Progress Notes (Signed)
    NURSING HOME LOCATION:  [facility] ROOM NUMBER:  121-A  CODE STATUS:  DNR  PCP:  Pecola LawlessHopper, Padraic F, MD  [address] [place] [id number]  This is a nursing facility follow up for specific acute issue of agitation  Interim medical record and care since [facility] was updated with review of diagnostic studies and change in clinical status since last visit were documented.  HPI: Staff reports intermittent non compliant behavior as refusal to take medicines or have vital signs checked.  He has also tried to kick staff intermittently.  As needed sedation requested.  He is on Namenda but this is not controlling his behaviors. He has been on Augmentin and metronidazole for possible aspiration pneumonia.  Meal intake continues to be monitored for  aspiration.  He is taken to the dining room and monitored one-on-one.  Review of systems: His severe dementia precludes any review of systems. See comments under exam.  Physical exam:  Pertinent or positive findings: He would not allow BP reading. appears chronically malnourished.  He is in a wheelchair but intermittently tries to rise and ambulate on his own.  He is wearing an anti-wandering bracelet. He had finished eating ice cream but became very agitated when I tried to take the empty cup from his hand.  He hallucinated that they were cooking hamburgers and hotdogs at the nurses' station in front of us & demanded a hamburger.   He has diffuse limb atrophy but was able to stand without help.  Heart sounds are distant.  He has diffuse low-grade rhonchi in all lung fields. No cough @ present  General appearance: no acute distress, increased work of breathing is present.   Neck:  No thyromegaly, masses, tenderness noted.    Heart:  No gallop, murmur, click, rub .  Lungs:  without wheezes, rales, rubs. Abdomen: Bowel sounds are normal. Abdomen is soft and nontender with no organomegaly, hernias, masses. GU:  Deferred  Extremities:  No cyanosis, clubbing, edema  Skin: Warm & dry w/o tenting. No significant lesions or rash.  See summary under each active problem in the Problem List with associated updated therapeutic plan

## 2018-05-08 NOTE — Assessment & Plan Note (Addendum)
Clinically his pulmonary status has improved after aggressive treatment for acute aspiration Continue one-on-one monitor with feeding as he is at incredibly high risk for recurrent aspiration

## 2018-05-09 ENCOUNTER — Encounter: Payer: Self-pay | Admitting: Internal Medicine

## 2018-05-09 NOTE — Patient Instructions (Signed)
See assessment and plan under each diagnosis in the problem list and acutely for this visit 

## 2018-05-28 ENCOUNTER — Encounter: Payer: Self-pay | Admitting: Adult Health

## 2018-05-28 ENCOUNTER — Non-Acute Institutional Stay (SKILLED_NURSING_FACILITY): Payer: Medicare Other | Admitting: Adult Health

## 2018-05-28 DIAGNOSIS — F02818 Dementia in other diseases classified elsewhere, unspecified severity, with other behavioral disturbance: Secondary | ICD-10-CM

## 2018-05-28 DIAGNOSIS — R1312 Dysphagia, oropharyngeal phase: Secondary | ICD-10-CM

## 2018-05-28 DIAGNOSIS — G308 Other Alzheimer's disease: Secondary | ICD-10-CM

## 2018-05-28 DIAGNOSIS — E039 Hypothyroidism, unspecified: Secondary | ICD-10-CM

## 2018-05-28 DIAGNOSIS — F0281 Dementia in other diseases classified elsewhere with behavioral disturbance: Secondary | ICD-10-CM

## 2018-05-28 DIAGNOSIS — R627 Adult failure to thrive: Secondary | ICD-10-CM

## 2018-05-28 NOTE — Progress Notes (Signed)
Location:  [facility] Room Number: 121-A Place of Service:  SNF (31) Provider:  Kenard Gower, NP  Patient Care Team: Pecola Lawless, MD as PCP - General (Internal Medicine)  Extended Emergency Contact Information Primary Emergency Contact: Dolan Amen [place] Home Phone: [phone] Relation: Neighbor  Code Status:  DNR  Goals of care: Advanced Directive information Advanced Directives 05/28/2018  Does Patient Have a Medical Advance Directive? Yes  Type of Advance Directive Out of facility DNR (pink MOST or yellow form)  Does patient want to make changes to medical advance directive? No - Patient declined  Copy of Healthcare Power of Attorney in Chart? -  Would patient like information on creating a medical advance directive? No - Patient declined  Pre-existing out of facility DNR order (yellow form or pink MOST form) -     Chief Complaint  Patient presents with  . Medical Management of Chronic Issues    The patient is seen for a routine [facility] visit    HPI:  Pt is a 84 y.o. male seen today for medical management of chronic diseases. He has PMH of CAD, chronic atrial fibrillation, sinus node dysfunction, hypothyroidism, essential hypertension, and dyslipidemia. He was seen by the nurses staion today. He was noted to be gurgly when he talks, keeps secretions in mouth. He is confused X 3. No SOB nor fever. He is currently on puree honey thickened liquid diet.   Past Medical History:  Diagnosis Date  . Atrial fibrillation (HCC)   . CAD (coronary artery disease)   . Compression fracture 2015  . Dyslipidemia   . Hypertension   . Hypothyroidism   . Sinus node dysfunction (HCC)    St.Jude pacemaker 06/05/2011  . Stented coronary artery 1998   Past Surgical History:  Procedure Laterality Date  . CARDIAC CATHETERIZATION  03/07/2000   100% CX,widely patent stent in RCA  . CARDIAC CATHETERIZATION  03/26/2007   100%  CX,widely patnet stent in RCA  . CATARACT EXTRACTION, BILATERAL  2011  . CHEST TUBE INSERTION  06/06/11   right iatrogenic pneumothorax  . CORONARY ANGIOPLASTY WITH STENT PLACEMENT  12/30/1996   stent RCA  . NM MYOVIEW LTD  03/04/2007   High risk,mild-mod anterolateral/inferolateral ischemia  . PACEMAKER INSERTION  06/05/11   St.Jude Accent DR  . patch perforated duodenal ulcer  05/28/11   Cheree Ditto patch    Allergies  Allergen Reactions  . Ibuprofen     PMH perforated ulcer All NSAIDs contraindicated    Outpatient Encounter Medications as of 05/28/2018  Medication Sig  . acetaminophen (TYLENOL) 500 MG tablet Take 500 mg by mouth every 6 (six) hours as needed for moderate pain.  . B Complex-Minerals (ELDERTONIC PO) Take 15 mLs by mouth 2 (two) times daily.  Marland Kitchen ipratropium-albuterol (DUONEB) 0.5-2.5 (3) MG/3ML SOLN Take 3 mLs by nebulization every 4 (four) hours as needed (Cough).  Marland Kitchen levothyroxine (SYNTHROID, LEVOTHROID) 100 MCG tablet Take 100 mcg by mouth daily before breakfast.  . memantine (NAMENDA) 10 MG tablet Take 10 mg by mouth 2 (two) times daily.  . Menthol, Topical Analgesic, (BIOFREEZE) 4 % GEL Apply to left heel every morning and at bedtime.  . Nutritional Supplements (NUTRITIONAL SUPPLEMENT PO) Take 1 each by mouth 3 (three) times daily. Magic Cup   . simvastatin (ZOCOR) 80 MG tablet Take 1 tablet (80 mg total) by mouth at bedtime.  . vitamin B-12 (CYANOCOBALAMIN) 1000 MCG tablet Take 1,000 mcg by mouth daily.  No facility-administered encounter medications on file as of 05/28/2018.     Review of Systems  Unable to obtain due to dementia   Immunization History  Administered Date(s) Administered  . Influenza, Seasonal, Injecte, Preservative Fre 06/27/2014  . Influenza,inj,Quad PF,6+ Mos 06/27/2014  . Pneumococcal Conjugate-13 03/12/2018  . Pneumococcal Polysaccharide-23 06/27/2014  . Tdap 06/17/2014   Pertinent  Health Maintenance Due  Topic Date Due  . INFLUENZA  VACCINE  04/10/2018  . PNA vac Low Risk Adult  Completed   Fall Risk  03/11/2018 06/14/2011  Falls in the past year? Yes -  Number falls in past yr: 2 or more -  Injury with Fall? No -  Risk for fall due to : - Impaired balance/gait;Other (Comment)  Risk for fall due to: Comment - weakness      Vitals:   05/28/18 0942  BP: 108/75  Pulse: 63  Resp: 18  Temp: (!) 97 F (36.1 C)  TempSrc: Oral  SpO2: 90%  Weight: 97 lb (44 kg)  Height: 5\' 9"  (1.753 m)   Body mass index is 14.32 kg/m.  Physical Exam  GENERAL APPEARANCE: Cachectic SKIN:  Skin is warm and dry.  MOUTH and THROAT: oral secretions in mouth causing him to be gurgly when talking RESPIRATORY: Breathing is even & unlabored, rales on bilateral lung field CARDIAC: Irregular heart rate, no murmur,no extra heart sounds, no edema, right chest pacmaker GI: Abdomen soft, normal BS, no masses, no tenderness EXTREMITIES:  Able to move X 4 extremities NEUROLOGIC: Confused X 3 PSYCHIATRIC:  Affect and behavior are appropriate  Labs reviewed: Recent Labs    02/07/18 0535  02/09/18 0448 02/12/18 0458 02/14/18 0924 03/12/18 03/31/18  NA 148*   < > 136 139 137 144 143  K 3.7   < > 3.5 4.6 3.9 3.7 4.0  CL 114*   < > 105 105 103  --   --   CO2 25   < > 22 28 28   --   --   GLUCOSE 127*   < > 85 107* 109*  --   --   BUN 24*   < > 21* 44* 20 21 18   CREATININE 0.90   < > 0.65 0.97 0.70 0.7 0.7  CALCIUM 8.8*   < > 7.9* 8.6* 8.1*  --   --   MG 2.1  --   --   --   --   --   --    < > = values in this interval not displayed.   Recent Labs    02/06/18 2333 03/31/18  AST 30 28  ALT 16* 20  ALKPHOS 79 155*  BILITOT 0.9  --   PROT 6.5  --   ALBUMIN 3.2*  --    Recent Labs    02/08/18 0526 02/09/18 0448 02/12/18 0458 03/12/18 03/17/18 03/31/18  WBC 6.5 9.2 9.8 11.1 9.6 6.4  NEUTROABS 5.7 7.7 8.2* 10 8 5   HGB 12.7* 12.0* 11.6* 10.1* 10.2* 10.2*  HCT 39.8 36.5* 35.7* 32* 31* 32*  MCV 94.1 91.5 92.7  --   --   --   PLT  292 353 396 378 398 424*   Lab Results  Component Value Date   TSH 2.61 03/31/2018   Lab Results  Component Value Date   HGBA1C 6.0 (H) 06/26/2014    Assessment/Plan   1. Oropharyngeal dysphagia - he is gurgly when speaking, will do chest x-ray to rule out pneumonia, continue puree with honey thick  liquids, aspiration precautions, will start Albuterol 2.5 mg/33ml 1 neb Q 6AM, 2PM and 10PM X 5 days, speech therap to evaluate swallowing   2. Hypothyroidism, unspecified type -  Continue levothyroxine 100 g 1 tab daily Lab Results  Component Value Date   TSH 2.61 03/31/2018     3. Adult failure to thrive - Body mass index is 14.32 kg/m. continue Eldertonic 15 mL twice a day, magic cup 3 times a day Last Weight  Most recent update: 05/28/2018  9:42 AM   Weight  44 kg (97 lb)            4. Alzheimer's disease of other onset with behavioral disturbance - continue memantine 10 mg 1 tab twice a day, supportive care, and fall precautions    Family/ staff Communication:  Discussed plan of care with charge nurse.  Labs/tests ordered:  Chest x-ray, CBC, BMP  Goals of care:   Long-term care.   Kenard Gower, NP [facility] and Adult Medicine [phone] (Monday-Friday 8:00 a.m. - 5:00 p.m.) [phone] (after hours)

## 2018-05-29 LAB — CBC AND DIFFERENTIAL
HCT: 35 — AB (ref 41–53)
Hemoglobin: 11.3 — AB (ref 13.5–17.5)
NEUTROS ABS: 9
Platelets: 225 (ref 150–399)
WBC: 9.8

## 2018-05-29 LAB — BASIC METABOLIC PANEL
BUN: 50 — AB (ref 4–21)
CREATININE: 1.4 — AB (ref 0.6–1.3)
Glucose: 93
Potassium: 3.8 (ref 3.4–5.3)
Sodium: 150 — AB (ref 137–147)

## 2018-05-31 ENCOUNTER — Encounter (HOSPITAL_COMMUNITY): Payer: Self-pay | Admitting: Emergency Medicine

## 2018-05-31 ENCOUNTER — Inpatient Hospital Stay (HOSPITAL_COMMUNITY)
Admission: EM | Admit: 2018-05-31 | Discharge: 2018-06-10 | DRG: 871 | Disposition: E | Payer: Medicare Other | Source: Skilled Nursing Facility | Attending: Internal Medicine | Admitting: Internal Medicine

## 2018-05-31 ENCOUNTER — Emergency Department (HOSPITAL_COMMUNITY): Payer: Medicare Other

## 2018-05-31 ENCOUNTER — Inpatient Hospital Stay (HOSPITAL_COMMUNITY): Payer: Medicare Other

## 2018-05-31 DIAGNOSIS — A419 Sepsis, unspecified organism: Principal | ICD-10-CM | POA: Diagnosis present

## 2018-05-31 DIAGNOSIS — R6510 Systemic inflammatory response syndrome (SIRS) of non-infectious origin without acute organ dysfunction: Secondary | ICD-10-CM | POA: Diagnosis not present

## 2018-05-31 DIAGNOSIS — E861 Hypovolemia: Secondary | ICD-10-CM | POA: Diagnosis present

## 2018-05-31 DIAGNOSIS — I959 Hypotension, unspecified: Secondary | ICD-10-CM | POA: Diagnosis not present

## 2018-05-31 DIAGNOSIS — I2510 Atherosclerotic heart disease of native coronary artery without angina pectoris: Secondary | ICD-10-CM | POA: Diagnosis present

## 2018-05-31 DIAGNOSIS — Z66 Do not resuscitate: Secondary | ICD-10-CM | POA: Diagnosis present

## 2018-05-31 DIAGNOSIS — F0280 Dementia in other diseases classified elsewhere without behavioral disturbance: Secondary | ICD-10-CM | POA: Diagnosis present

## 2018-05-31 DIAGNOSIS — Z955 Presence of coronary angioplasty implant and graft: Secondary | ICD-10-CM

## 2018-05-31 DIAGNOSIS — I493 Ventricular premature depolarization: Secondary | ICD-10-CM | POA: Diagnosis present

## 2018-05-31 DIAGNOSIS — Z638 Other specified problems related to primary support group: Secondary | ICD-10-CM

## 2018-05-31 DIAGNOSIS — N179 Acute kidney failure, unspecified: Secondary | ICD-10-CM | POA: Diagnosis present

## 2018-05-31 DIAGNOSIS — J690 Pneumonitis due to inhalation of food and vomit: Secondary | ICD-10-CM | POA: Diagnosis present

## 2018-05-31 DIAGNOSIS — I495 Sick sinus syndrome: Secondary | ICD-10-CM | POA: Diagnosis present

## 2018-05-31 DIAGNOSIS — F0281 Dementia in other diseases classified elsewhere with behavioral disturbance: Secondary | ICD-10-CM | POA: Diagnosis not present

## 2018-05-31 DIAGNOSIS — R402252 Coma scale, best verbal response, oriented, at arrival to emergency department: Secondary | ICD-10-CM | POA: Diagnosis present

## 2018-05-31 DIAGNOSIS — Z515 Encounter for palliative care: Secondary | ICD-10-CM | POA: Diagnosis not present

## 2018-05-31 DIAGNOSIS — R64 Cachexia: Secondary | ICD-10-CM | POA: Diagnosis present

## 2018-05-31 DIAGNOSIS — E860 Dehydration: Secondary | ICD-10-CM | POA: Diagnosis present

## 2018-05-31 DIAGNOSIS — Z8711 Personal history of peptic ulcer disease: Secondary | ICD-10-CM

## 2018-05-31 DIAGNOSIS — G309 Alzheimer's disease, unspecified: Secondary | ICD-10-CM | POA: Diagnosis present

## 2018-05-31 DIAGNOSIS — J189 Pneumonia, unspecified organism: Secondary | ICD-10-CM

## 2018-05-31 DIAGNOSIS — Z79890 Hormone replacement therapy (postmenopausal): Secondary | ICD-10-CM

## 2018-05-31 DIAGNOSIS — Z79899 Other long term (current) drug therapy: Secondary | ICD-10-CM

## 2018-05-31 DIAGNOSIS — G9341 Metabolic encephalopathy: Secondary | ICD-10-CM | POA: Diagnosis present

## 2018-05-31 DIAGNOSIS — J9601 Acute respiratory failure with hypoxia: Secondary | ICD-10-CM | POA: Diagnosis present

## 2018-05-31 DIAGNOSIS — Z9841 Cataract extraction status, right eye: Secondary | ICD-10-CM

## 2018-05-31 DIAGNOSIS — I10 Essential (primary) hypertension: Secondary | ICD-10-CM | POA: Diagnosis present

## 2018-05-31 DIAGNOSIS — I482 Chronic atrial fibrillation: Secondary | ICD-10-CM

## 2018-05-31 DIAGNOSIS — Z9842 Cataract extraction status, left eye: Secondary | ICD-10-CM

## 2018-05-31 DIAGNOSIS — I4891 Unspecified atrial fibrillation: Secondary | ICD-10-CM | POA: Diagnosis present

## 2018-05-31 DIAGNOSIS — Z8249 Family history of ischemic heart disease and other diseases of the circulatory system: Secondary | ICD-10-CM

## 2018-05-31 DIAGNOSIS — R1310 Dysphagia, unspecified: Secondary | ICD-10-CM

## 2018-05-31 DIAGNOSIS — R0600 Dyspnea, unspecified: Secondary | ICD-10-CM | POA: Diagnosis not present

## 2018-05-31 DIAGNOSIS — E039 Hypothyroidism, unspecified: Secondary | ICD-10-CM | POA: Diagnosis present

## 2018-05-31 DIAGNOSIS — E785 Hyperlipidemia, unspecified: Secondary | ICD-10-CM | POA: Diagnosis present

## 2018-05-31 DIAGNOSIS — R402142 Coma scale, eyes open, spontaneous, at arrival to emergency department: Secondary | ICD-10-CM | POA: Diagnosis present

## 2018-05-31 DIAGNOSIS — R402362 Coma scale, best motor response, obeys commands, at arrival to emergency department: Secondary | ICD-10-CM | POA: Diagnosis present

## 2018-05-31 DIAGNOSIS — Z681 Body mass index (BMI) 19 or less, adult: Secondary | ICD-10-CM

## 2018-05-31 DIAGNOSIS — E43 Unspecified severe protein-calorie malnutrition: Secondary | ICD-10-CM | POA: Diagnosis present

## 2018-05-31 DIAGNOSIS — E870 Hyperosmolality and hypernatremia: Secondary | ICD-10-CM | POA: Diagnosis present

## 2018-05-31 DIAGNOSIS — Z8619 Personal history of other infectious and parasitic diseases: Secondary | ICD-10-CM

## 2018-05-31 DIAGNOSIS — R6521 Severe sepsis with septic shock: Secondary | ICD-10-CM | POA: Diagnosis present

## 2018-05-31 DIAGNOSIS — Z950 Presence of cardiac pacemaker: Secondary | ICD-10-CM

## 2018-05-31 DIAGNOSIS — Z8701 Personal history of pneumonia (recurrent): Secondary | ICD-10-CM

## 2018-05-31 DIAGNOSIS — Z886 Allergy status to analgesic agent status: Secondary | ICD-10-CM

## 2018-05-31 DIAGNOSIS — Z87891 Personal history of nicotine dependence: Secondary | ICD-10-CM

## 2018-05-31 DIAGNOSIS — G308 Other Alzheimer's disease: Secondary | ICD-10-CM | POA: Diagnosis not present

## 2018-05-31 LAB — CBC WITH DIFFERENTIAL/PLATELET
BASOS PCT: 0 %
Band Neutrophils: 6 %
Basophils Absolute: 0 10*3/uL (ref 0.0–0.1)
Blasts: 0 %
EOS PCT: 0 %
Eosinophils Absolute: 0 10*3/uL (ref 0.0–0.7)
HEMATOCRIT: 43.8 % (ref 39.0–52.0)
Hemoglobin: 13.1 g/dL (ref 13.0–17.0)
LYMPHS PCT: 12 %
Lymphs Abs: 0.7 10*3/uL (ref 0.7–4.0)
MCH: 28.2 pg (ref 26.0–34.0)
MCHC: 29.9 g/dL — ABNORMAL LOW (ref 30.0–36.0)
MCV: 94.4 fL (ref 78.0–100.0)
Metamyelocytes Relative: 0 %
Monocytes Absolute: 0 10*3/uL — ABNORMAL LOW (ref 0.1–1.0)
Monocytes Relative: 0 %
Myelocytes: 0 %
NEUTROS ABS: 5 10*3/uL (ref 1.7–7.7)
NEUTROS PCT: 82 %
NRBC: 0 /100{WBCs}
OTHER: 0 %
PLATELETS: 282 10*3/uL (ref 150–400)
Promyelocytes Relative: 0 %
RBC: 4.64 MIL/uL (ref 4.22–5.81)
RDW: 18.6 % — AB (ref 11.5–15.5)
WBC: 5.7 10*3/uL (ref 4.0–10.5)

## 2018-05-31 LAB — COMPREHENSIVE METABOLIC PANEL
ALT: 26 U/L (ref 0–44)
ANION GAP: 15 (ref 5–15)
AST: 32 U/L (ref 15–41)
Albumin: 2.4 g/dL — ABNORMAL LOW (ref 3.5–5.0)
Alkaline Phosphatase: 85 U/L (ref 38–126)
BUN: 69 mg/dL — ABNORMAL HIGH (ref 8–23)
CHLORIDE: 117 mmol/L — AB (ref 98–111)
CO2: 20 mmol/L — AB (ref 22–32)
Calcium: 8.4 mg/dL — ABNORMAL LOW (ref 8.9–10.3)
Creatinine, Ser: 2.36 mg/dL — ABNORMAL HIGH (ref 0.61–1.24)
GFR calc non Af Amer: 22 mL/min — ABNORMAL LOW (ref >60)
GFR, EST AFRICAN AMERICAN: 25 mL/min — AB (ref >60)
Glucose, Bld: 154 mg/dL — ABNORMAL HIGH (ref 70–99)
Potassium: 3.8 mmol/L (ref 3.5–5.1)
SODIUM: 152 mmol/L — AB (ref 135–145)
Total Bilirubin: 0.9 mg/dL (ref 0.3–1.2)
Total Protein: 6.2 g/dL — ABNORMAL LOW (ref 6.5–8.1)

## 2018-05-31 LAB — PROTIME-INR
INR: 1.23
Prothrombin Time: 15.4 seconds — ABNORMAL HIGH (ref 11.4–15.2)

## 2018-05-31 LAB — I-STAT CG4 LACTIC ACID, ED: Lactic Acid, Venous: 4.14 mmol/L (ref 0.5–1.9)

## 2018-05-31 MED ORDER — SENNOSIDES-DOCUSATE SODIUM 8.6-50 MG PO TABS: 1.0000 | ORAL_TABLET | Freq: Every evening | ORAL | Status: DC | PRN

## 2018-05-31 MED ORDER — ONDANSETRON HCL 4 MG PO TABS: 4.0000 mg | ORAL_TABLET | Freq: Four times a day (QID) | ORAL | Status: DC | PRN

## 2018-05-31 MED ORDER — LEVOTHYROXINE SODIUM 100 MCG PO TABS: 100.0000 ug | ORAL_TABLET | Freq: Every day | ORAL | Status: DC

## 2018-05-31 MED ORDER — FLEET ENEMA 7-19 GM/118ML RE ENEM: 1.0000 | ENEMA | Freq: Once | RECTAL | Status: DC | PRN

## 2018-05-31 MED ORDER — SODIUM CHLORIDE 0.9% FLUSH: 3.0000 mL | Freq: Two times a day (BID) | INTRAVENOUS | Status: DC

## 2018-05-31 MED ORDER — VANCOMYCIN HCL 500 MG IV SOLR: 500.0000 mg | INTRAVENOUS | Status: DC

## 2018-05-31 MED ORDER — ATORVASTATIN CALCIUM 40 MG PO TABS: 40.0000 mg | ORAL_TABLET | Freq: Every day | ORAL | Status: DC

## 2018-05-31 MED ORDER — POTASSIUM CHLORIDE IN NACL 20-0.45 MEQ/L-% IV SOLN
INTRAVENOUS | Status: DC
  Filled 2018-05-31: qty 1000

## 2018-05-31 MED ORDER — ACETAMINOPHEN 650 MG RE SUPP: 650.0000 mg | Freq: Four times a day (QID) | RECTAL | Status: DC | PRN

## 2018-05-31 MED ORDER — SODIUM CHLORIDE 0.9 % IV SOLN: 250.0000 mL | INTRAVENOUS | Status: DC | PRN

## 2018-05-31 MED ORDER — SODIUM CHLORIDE 0.9% FLUSH: 3.0000 mL | INTRAVENOUS | Status: DC | PRN

## 2018-05-31 MED ORDER — SODIUM CHLORIDE 0.9 % IV SOLN: 1.0000 g | INTRAVENOUS | Status: DC

## 2018-05-31 MED ORDER — MEMANTINE HCL 10 MG PO TABS: 10.0000 mg | ORAL_TABLET | Freq: Two times a day (BID) | ORAL | Status: DC

## 2018-05-31 MED ORDER — ONDANSETRON HCL 4 MG/2ML IJ SOLN: 4.0000 mg | Freq: Four times a day (QID) | INTRAMUSCULAR | Status: DC | PRN

## 2018-05-31 MED ORDER — SODIUM CHLORIDE 0.9 % IV BOLUS
30.0000 mL/kg | Freq: Once | INTRAVENOUS | Status: AC
  Administered 2018-05-31: 1320 mL via INTRAVENOUS

## 2018-05-31 MED ORDER — SODIUM CHLORIDE 0.9 % IV SOLN
INTRAVENOUS | Status: DC | PRN
  Administered 2018-05-31: 22:00:00 via INTRAVENOUS

## 2018-05-31 MED ORDER — ACETAMINOPHEN 325 MG PO TABS: 650.0000 mg | ORAL_TABLET | Freq: Four times a day (QID) | ORAL | Status: DC | PRN

## 2018-05-31 MED ORDER — SODIUM CHLORIDE 0.9 % IV SOLN
1.0000 g | Freq: Once | INTRAVENOUS | Status: AC
  Administered 2018-05-31: 1 g via INTRAVENOUS
  Filled 2018-05-31: qty 1

## 2018-05-31 MED ORDER — SODIUM CHLORIDE 0.9% FLUSH
3.0000 mL | Freq: Two times a day (BID) | INTRAVENOUS | Status: DC
  Administered 2018-06-01: 3 mL via INTRAVENOUS

## 2018-05-31 MED ORDER — HEPARIN SODIUM (PORCINE) 5000 UNIT/ML IJ SOLN
5000.0000 [IU] | Freq: Three times a day (TID) | INTRAMUSCULAR | Status: DC
  Administered 2018-06-01: 5000 [IU] via SUBCUTANEOUS
  Filled 2018-05-31: qty 1

## 2018-05-31 MED ORDER — LORAZEPAM 2 MG/ML IJ SOLN: 0.5000 mg | INTRAMUSCULAR | Status: DC | PRN

## 2018-05-31 MED ORDER — BISACODYL 10 MG RE SUPP: 10.0000 mg | Freq: Every day | RECTAL | Status: DC | PRN

## 2018-05-31 MED ORDER — SODIUM CHLORIDE 0.9 % IV SOLN
Freq: Once | INTRAVENOUS | Status: AC
  Administered 2018-05-31: 20:00:00 via INTRAVENOUS

## 2018-05-31 MED ORDER — VITAMIN B-12 1000 MCG PO TABS: 1000.0000 ug | ORAL_TABLET | Freq: Every day | ORAL | Status: DC

## 2018-05-31 MED ORDER — METRONIDAZOLE IN NACL 5-0.79 MG/ML-% IV SOLN
500.0000 mg | Freq: Three times a day (TID) | INTRAVENOUS | Status: DC
  Administered 2018-05-31: 500 mg via INTRAVENOUS
  Filled 2018-05-31: qty 100

## 2018-05-31 MED ORDER — VANCOMYCIN HCL IN DEXTROSE 750-5 MG/150ML-% IV SOLN
750.0000 mg | Freq: Once | INTRAVENOUS | Status: AC
  Administered 2018-05-31: 750 mg via INTRAVENOUS
  Filled 2018-05-31: qty 150

## 2018-05-31 MED ORDER — FENTANYL CITRATE (PF) 100 MCG/2ML IJ SOLN: 25.0000 ug | INTRAMUSCULAR | Status: DC | PRN

## 2018-05-31 MED ORDER — HYDROCORTISONE NA SUCCINATE PF 100 MG IJ SOLR
100.0000 mg | Freq: Once | INTRAMUSCULAR | Status: AC
  Administered 2018-06-01: 100 mg via INTRAVENOUS
  Filled 2018-05-31: qty 2

## 2018-05-31 MED ORDER — IPRATROPIUM-ALBUTEROL 0.5-2.5 (3) MG/3ML IN SOLN
3.0000 mL | Freq: Once | RESPIRATORY_TRACT | Status: AC
  Administered 2018-05-31: 3 mL via RESPIRATORY_TRACT
  Filled 2018-05-31: qty 3

## 2018-05-31 MED ORDER — IPRATROPIUM-ALBUTEROL 0.5-2.5 (3) MG/3ML IN SOLN
3.0000 mL | Freq: Three times a day (TID) | RESPIRATORY_TRACT | Status: DC
  Administered 2018-05-31: 3 mL via RESPIRATORY_TRACT
  Filled 2018-05-31: qty 3

## 2018-05-31 MED ORDER — SODIUM CHLORIDE 0.9 % IV SOLN
INTRAVENOUS | Status: DC | PRN
  Administered 2018-05-31: 20:00:00 via INTRAVENOUS

## 2018-05-31 MED ORDER — SODIUM CHLORIDE 0.9 % IV BOLUS (SEPSIS)
750.0000 mL | Freq: Once | INTRAVENOUS | Status: AC
  Administered 2018-05-31: 750 mL via INTRAVENOUS

## 2018-05-31 NOTE — ED Notes (Signed)
I Stat [place] Acid results of 4.14 reported to Dr. Judie PetitM. Pfeiffer

## 2018-05-31 NOTE — ED Notes (Signed)
US arrived at bedside to perform Renal UKorea

## 2018-05-31 NOTE — Progress Notes (Signed)
Pharmacy Antibiotic Note  [name] is a 84 y.o. male admitted on July 21, 2018 with pneumonia.  Pharmacy has been consulted for vancomycin dosing. Also with cefepime ordered x 1 in the ED. SCr 2.36 on admit (up from 1.4 on 9/19). WBC wnl, LA 4.14.  Plan: Vancomycin 750mg  IV x 1 already given in the ED; then 500mg  IV q48h Cefepime 1g IV x 1 per EDP Monitor clinical progress, c/s, renal function F/u de-escalation plan/LOT, vancomycin trough as indicated F/u continuation of cefepime?     No data recorded.  Recent Labs  Lab 05/29/18 01-08-2018 1935 01-08-2018 1942  WBC 9.8 5.7  --   CREATININE 1.4* 2.36*  --   LATICACIDVEN  --   --  4.14*    Estimated Creatinine Clearance: 11.7 mL/min (A) (by C-G formula based on SCr of 2.36 mg/dL (H)).    Allergies  Allergen Reactions  . Ibuprofen     PMH perforated ulcer All NSAIDs contraindicated    Babs BertinHaley Sena Hoopingarner, PharmD, BCPS Clinical Pharmacist Please check AMION for all [facility] contact numbers July 21, 2018 8:30 PM

## 2018-05-31 NOTE — ED Notes (Signed)
Multiple attempts made for blood and IV.  Unable to obtain second set of cultures.  Will start antibiotics upon arrival

## 2018-05-31 NOTE — ED Notes (Signed)
US continues to be at bedside

## 2018-05-31 NOTE — ED Notes (Signed)
EDP at bedside  

## 2018-05-31 NOTE — ED Notes (Signed)
Pt continues to rip NRB off of his face, and attempts to bite staff when trying to place it back on

## 2018-05-31 NOTE — ED Provider Notes (Signed)
[facility]   CSN: [id number] Arrival date & time: 2018-04-15  1859     History   Chief Complaint Chief Complaint  Patient presents with  . Altered Mental Status  . Shortness of Breath    HPI [name] is a 84 y.o. male.  HPI Patient sent from nursing home with increased difficulty breathing and less active than usual.  Patient has Alzheimer's dementia and reportedly at baseline can be fairly combative and active.  Typically very confused.  Today patient seemed to have less activity level and more difficulty breathing.  Reportedly patient has had recent diagnosis of pneumonia.  Notes indicate that nursing staff reports "gurgling sound when breathing".  Patient reportedly has been risk for recurrent aspiration. Past Medical History:  Diagnosis Date  . Atrial fibrillation (HCC)   . CAD (coronary artery disease)   . Compression fracture 2015  . Dyslipidemia   . Hypertension   . Hypothyroidism   . Sinus node dysfunction (HCC)    St.Jude pacemaker 06/05/2011  . Stented coronary artery 1998    Patient Active Problem List   Diagnosis Date Noted  . Hypothyroidism 04/29/2018  . Chronic anemia 03/19/2018  . Adult failure to thrive 02/18/2018  . DNR no code (do not resuscitate) 02/18/2018  . Enterococcus UTI 02/16/2018  . Aspiration pneumonia of both lower lobes due to gastric secretions (HCC) 02/16/2018  . Fall at home   . Lumbar compression fracture (HCC) 06/26/2014  . Compression fracture 06/26/2014  . Atrial fibrillation (HCC) 11/26/2012  . Long term (current) use of anticoagulants 11/26/2012  . Alzheimer's dementia 07/05/2011  . Cerumen impaction 07/05/2011  . Hypertension 06/13/2011  . Supraspinatus tendonitis 06/13/2011  . Hyperlipidemia 06/13/2011  . Cardiac pacemaker 06/05/2011  . Perforated duodenal ulcer (HCC) 05/28/2011    Class: Status post  . Coronary artery disease 06/12/1997    Past Surgical  History:  Procedure Laterality Date  . CARDIAC CATHETERIZATION  03/07/2000   100% CX,widely patent stent in RCA  . CARDIAC CATHETERIZATION  03/26/2007   100% CX,widely patnet stent in RCA  . CATARACT EXTRACTION, BILATERAL  2011  . CHEST TUBE INSERTION  06/06/11   right iatrogenic pneumothorax  . CORONARY ANGIOPLASTY WITH STENT PLACEMENT  12/30/1996   stent RCA  . NM MYOVIEW LTD  03/04/2007   High risk,mild-mod anterolateral/inferolateral ischemia  . PACEMAKER INSERTION  06/05/11   St.Jude Accent DR  . patch perforated duodenal ulcer  05/28/11   Cheree DittoGraham patch        Home Medications    Prior to Admission medications   Medication Sig Start Date End Date Taking? Authorizing Provider  acetaminophen (TYLENOL) 500 MG tablet Take 500 mg by mouth every 6 (six) hours as needed (pain).    Yes [provider]  albuterol (PROVENTIL) (2.5 MG/3ML) 0.083% nebulizer solution Take 2.5 mg by nebulization every 8 (eight) hours.   Yes [provider]  B Complex-Minerals (ELDERTONIC PO) Take 15 mLs by mouth 2 (two) times daily. For appetite stimulant   Yes [provider]  bisacodyl (DULCOLAX) 10 MG suppository Place 10 mg rectally daily as needed (constipation not relieved by MOM).   Yes [provider]  Dextrose-Sodium Chloride (DEXTROSE 5 % AND 0.45% NACL) infusion Inject 1,000 mLs into the vein See admin instructions. Administer  3 times daily at 1 liter at 70 cc/hr for acute renal failure   Yes [provider]  ipratropium-albuterol (DUONEB) 0.5-2.5 (3) MG/3ML SOLN Take  3 mLs by nebulization every 8 (eight) hours.    Yes [provider]  levothyroxine (SYNTHROID, LEVOTHROID) 100 MCG tablet Take 100 mcg by mouth daily before breakfast.   Yes [provider]  Magnesium Hydroxide (MILK OF MAGNESIA PO) Take 30 mLs by mouth daily as needed (if no BM in 3 days).   Yes [provider]  memantine (NAMENDA) 10 MG tablet Take 10 mg by mouth 2  (two) times daily.   Yes [provider]  Menthol, Topical Analgesic, (BIOFREEZE) 4 % GEL Apply 1 application topically See admin instructions. Apply topically to left heel every morning and at bedtime.   Yes [provider]  Nutritional Supplements (NUTRITIONAL SUPPLEMENT PO) Take 1 each by mouth 3 (three) times daily. Magic Cup    Yes [provider]  PRESCRIPTION MEDICATION 0.25 mg See admin instructions. Lorazepam gel 0.25 mg - apply topically daily as needed for increased agitation   Yes [provider]  simvastatin (ZOCOR) 80 MG tablet Take 1 tablet (80 mg total) by mouth at bedtime. 11/09/13  Yes Croitoru, Mihai, MD  Sodium Phosphates (FLEET ENEMA RE) Place 1 enema rectally daily as needed (constipation not relieved by Bisacodyl suppository).   Yes [provider]  vitamin B-12 (CYANOCOBALAMIN) 1000 MCG tablet Take 1,000 mcg by mouth daily.   Yes [provider]    Family History Family History  Problem Relation Age of Onset  . Heart failure Mother   . Heart failure Father   . Heart failure Sister     Social History Social History   Tobacco Use  . Smoking status: Former Smoker    Years: 1.00    Types: Cigarettes  . Smokeless tobacco: Never Used  Substance Use Topics  . Alcohol use: No  . Drug use: No     Allergies   Ibuprofen   Review of Systems Review of Systems Level 5 caveat cannot obtain review of systems due to patient confusion\dementia  Physical Exam Updated Vital Signs BP (!) 81/46   Pulse (!) 47   Resp (!) 29   SpO2 96%   Physical Exam  Constitutional: He appears well-developed and well-nourished.  Patient is extremely cachectic.  Skin is thin and dry.  Patient's respiration work mild to moderately increased.  Patient occasionally coughs with some wet sounding secretions.  HENT:  Head: Normocephalic and atraumatic.  Eyes: EOM are normal.  Neck: Neck supple.  Cardiovascular: Normal rate.  Heart  sounds are distant.  Rate is in the 60s.  Pulmonary/Chest:  Patient has mild to moderate increased work of breathing.  Occasional wet sounding cough.  Sounds are very soft throughout.  Occasional rhonchi.  Abdominal: Soft. Bowel sounds are normal. He exhibits no distension. There is no tenderness.  Musculoskeletal: Normal range of motion. He exhibits no edema.  Patient's extremities are extremely cachectic.  Skin is very thin.  Neurological: He has normal strength. GCS eye subscore is 4. GCS verbal subscore is 5. GCS motor subscore is 6.  Patient is awake.  He occasionally is making vocalizations but he is extremely difficult to understand.  Make some spontaneous movement of the upper extremities.  Skin: Skin is warm, dry and intact.     ED Treatments / Results  Labs (all labs ordered are listed, but only abnormal results are displayed) Labs Reviewed  COMPREHENSIVE METABOLIC PANEL - Abnormal; Notable for the following components:      Result Value   Sodium 152 (*)    Chloride  117 (*)    CO2 20 (*)    Glucose, Bld 154 (*)    BUN 69 (*)    Creatinine, Ser 2.36 (*)    Calcium 8.4 (*)    Total Protein 6.2 (*)    Albumin 2.4 (*)    GFR calc non Af Amer 22 (*)    GFR calc Af Amer 25 (*)    All other components within normal limits  CBC WITH DIFFERENTIAL/PLATELET - Abnormal; Notable for the following components:   MCHC 29.9 (*)    RDW 18.6 (*)    Monocytes Absolute 0.0 (*)    All other components within normal limits  PROTIME-INR - Abnormal; Notable for the following components:   Prothrombin Time 15.4 (*)    All other components within normal limits  I-STAT CG4 LACTIC ACID, ED - Abnormal; Notable for the following components:   Lactic Acid, Venous 4.14 (*)    All other components within normal limits  URINE CULTURE  CULTURE, BLOOD (ROUTINE X 2)  CULTURE, BLOOD (ROUTINE X 2)  URINALYSIS, ROUTINE W REFLEX MICROSCOPIC  I-STAT CG4 LACTIC ACID, ED    EKG EKG  Interpretation  Date/Time:  Saturday Jun 07, 2018 19:07:39 EDT Ventricular Rate:  63 PR Interval:    QRS Duration: 132 QT Interval:  487 QTC Calculation: 487 R Axis:   -105 Text Interpretation:  Junctional rhythm Ventricular premature complex Nonspecific IVCD with LAD Consider inferior infarct Extensive anterior infarct, old Borderline T abnormalities, inferior leads pacer spikes not visible but no morphology change of QRS from previios Confirmed by Arby Barrette [phone]) on 06-07-18 8:18:14 PM   Radiology Dg Chest Port 1 View  Result Date: Jun 07, 2018 CLINICAL DATA:  Hypoxia. Altered mental status. EXAM: PORTABLE CHEST 1 VIEW COMPARISON:  02/07/2018 and 07/03/2013. FINDINGS: Stable borderline enlarged cardiac silhouette and tortuous and calcified thoracic aorta. Stable right diaphragmatic eventration. Mild airspace opacity at both lung bases. Diffuse osteopenia. Stable midthoracic vertebral compression deformity. IMPRESSION: Mild bibasilar pneumonia or patchy atelectasis. Electronically Signed   By: Beckie Salts M.D.   On: 06/07/2018 19:53    Procedures Procedures (including critical care time) CRITICAL CARE Performed by: Arby Barrette   Total critical care time: 30  minutes  Critical care time was exclusive of separately billable procedures and treating other patients.  Critical care was necessary to treat or prevent imminent or life-threatening deterioration.  Critical care was time spent personally by me on the following activities: development of treatment plan with patient and/or surrogate as well as nursing, discussions with consultants, evaluation of patient's response to treatment, examination of patient, obtaining history from patient or surrogate, ordering and performing treatments and interventions, ordering and review of laboratory studies, ordering and review of radiographic studies, pulse oximetry and re-evaluation of patient's condition. Medications Ordered in  ED Medications  vancomycin (VANCOCIN) IVPB 750 mg/150 ml premix (750 mg Intravenous New Bag/Given June 07, 2018 2014)  0.9 %  sodium chloride infusion ( Intravenous New Bag/Given 06-07-2018 2013)  vancomycin (VANCOCIN) 500 mg in sodium chloride 0.9 % 100 mL IVPB (has no administration in time range)  sodium chloride 0.9 % bolus 1,320 mL (1,320 mLs Intravenous New Bag/Given 06-07-18 1934)  ipratropium-albuterol (DUONEB) 0.5-2.5 (3) MG/3ML nebulizer solution 3 mL (3 mLs Nebulization Given Jun 07, 2018 1943)  ceFEPIme (MAXIPIME) 1 g in sodium chloride 0.9 % 100 mL IVPB (1 g Intravenous New Bag/Given 06/07/2018 2031)  0.9 %  sodium chloride infusion ( Intravenous New Bag/Given 06-07-2018 2029)  ipratropium-albuterol (DUONEB) 0.5-2.5 (3) MG/3ML  nebulizer solution 3 mL (3 mLs Nebulization Given June 16, 2018 2105)     Initial Impression / Assessment and Plan / ED Course  I have reviewed the triage vital signs and the nursing notes.  Pertinent labs & imaging results that were available during my care of the patient were reviewed by me and considered in my medical decision making (see chart for details).    Patient is improving as he is rehydrated.  He initially had low blood pressures and was less active.  He is now becoming more vocal and disagreeable.  This reportedly is his normal baseline.  At baseline he is somewhat hostile and may be physically aggressive.  Patient is now taking off his mask and resistant to treatment.  His oxygen saturation with supplemental oxygen is at 100%.  Continues to have wet, rhonchorous cough intermittently.  When he takes his oxygen off sats dipped down to the 80s.  His color has improved.  He is appearing more and more alert.  Blood pressures have now moved up into the high 80s systolic.  Patient is DNR and will not be a candidate for intubation and based on his general demeanor, I do not believe he will be agreeable for BiPAP.  At this point, I really do not believe he needs BiPAP.  Based on  chest x-ray and history have restarted H CAP antibiotics.  Patient is afebrile and does not have leukocytosis.  He does have significant AK I which I believe is likely he has more primary problem.  Final Clinical Impressions(s) / ED Diagnoses   Final diagnoses:  Dehydration  AKI (acute kidney injury) (HCC)  HCAP (healthcare-associated pneumonia)    ED Discharge Orders    None       Arby Barrette, MD 2018-06-16 2108

## 2018-05-31 NOTE — H&P (Signed)
History and Physical    Devin Clark UJW:[id number] DOB: 1921-05-22 DOA: 06/09/2018  PCP: Pecola LawlessHopper, Viraat F, MD   Patient coming from: SNF   Chief Complaint: Lethargy, respiratory distress   HPI: Devin Clark is a 84 y.o. male with medical history significant for dementia, dysphasia with recurrent aspiration, hypothyroidism, coronary artery disease, and failure to thrive, now presenting to the emergency department for evaluation of lethargy and respiratory distress.  At his baseline, patient is reportedly disoriented, but active and frequently combative.  He has been coughing a lot for the past couple days, has been treated with breathing treatments and IV fluids at the nursing facility recently, but was noted to be very lethargic today in addition to worsening respiratory distress.  He was treated for HCAP last month.  Patient is unable to contribute to the history due to his clinical condition.  EMS was called and the patient was found to be saturating in the mid 70s on room air.  He was placed on nonrebreather with 15 L/min of supplemental oxygen and transported to the ED.  Case was discussed with his emergency contact who is aware of the patient's critical status and poor prognosis, indicating that she plans to come to the hospital.  ED Course: Upon arrival to the ED, patient is found to be saturating 92% on nonrebreather, tachypneic, and hypotensive to 63/29.  EKG features a junctional rhythm with nonspecific IVCD and chest x-ray is notable for mild bibasilar pneumonia or patchy atelectasis.  Chemistry panel features a sodium of 152, bicarbonate 20, BUN 69, and creatinine 2.36, up from 1.4 two days earlier.  CBC is unremarkable.  Lactic acid is elevated 4.14.  Blood culture was collected, 30 cc/kg normal saline was given, patient was treated with DuoNeb, and started on empiric vancomycin and cefepime.  Blood pressure improved initially and again fell with systolic back in the 60s.  He has  become more alert and combative in the ED.  Emergency contact was updated by phone and aware of the patient's critical status.  He will be admitted to the stepdown unit for ongoing evaluation and management of suspected aspiration pneumonia with underlying failure to thrive, marked hypovolemia, and acute kidney injury.  Review of Systems:  Unable to complete ROS secondary to patient's clinical condition.  Past Medical History:  Diagnosis Date  . Atrial fibrillation (HCC)   . CAD (coronary artery disease)   . Compression fracture 2015  . Dyslipidemia   . Hypertension   . Hypothyroidism   . Sinus node dysfunction (HCC)    St.Jude pacemaker 06/05/2011  . Stented coronary artery 1998    Past Surgical History:  Procedure Laterality Date  . CARDIAC CATHETERIZATION  03/07/2000   100% CX,widely patent stent in RCA  . CARDIAC CATHETERIZATION  03/26/2007   100% CX,widely patnet stent in RCA  . CATARACT EXTRACTION, BILATERAL  2011  . CHEST TUBE INSERTION  06/06/11   right iatrogenic pneumothorax  . CORONARY ANGIOPLASTY WITH STENT PLACEMENT  12/30/1996   stent RCA  . NM MYOVIEW LTD  03/04/2007   High risk,mild-mod anterolateral/inferolateral ischemia  . PACEMAKER INSERTION  06/05/11   St.Jude Accent DR  . patch perforated duodenal ulcer  05/28/11   Cheree DittoGraham patch     reports that he has quit smoking. His smoking use included cigarettes. He quit after 1.00 year of use. He has never used smokeless tobacco. He reports that he does not drink alcohol or use drugs.  Allergies  Allergen Reactions  .  Ibuprofen     PMH perforated ulcer All NSAIDs contraindicated    Family History  Problem Relation Age of Onset  . Heart failure Mother   . Heart failure Father   . Heart failure Sister      Prior to Admission medications   Medication Sig Start Date End Date Taking? Authorizing Provider  acetaminophen (TYLENOL) 500 MG tablet Take 500 mg by mouth every 6 (six) hours as needed (pain).    Yes  [provider]  albuterol (PROVENTIL) (2.5 MG/3ML) 0.083% nebulizer solution Take 2.5 mg by nebulization every 8 (eight) hours.   Yes [provider]  B Complex-Minerals (ELDERTONIC PO) Take 15 mLs by mouth 2 (two) times daily. For appetite stimulant   Yes [provider]  bisacodyl (DULCOLAX) 10 MG suppository Place 10 mg rectally daily as needed (constipation not relieved by MOM).   Yes [provider]  Dextrose-Sodium Chloride (DEXTROSE 5 % AND 0.45% NACL) infusion Inject 1,000 mLs into the vein See admin instructions. Administer  3 times daily at 1 liter at 70 cc/hr for acute renal failure   Yes [provider]  ipratropium-albuterol (DUONEB) 0.5-2.5 (3) MG/3ML SOLN Take 3 mLs by nebulization every 8 (eight) hours.    Yes [provider]  levothyroxine (SYNTHROID, LEVOTHROID) 100 MCG tablet Take 100 mcg by mouth daily before breakfast.   Yes [provider]  Magnesium Hydroxide (MILK OF MAGNESIA PO) Take 30 mLs by mouth daily as needed (if no BM in 3 days).   Yes [provider]  memantine (NAMENDA) 10 MG tablet Take 10 mg by mouth 2 (two) times daily.   Yes [provider]  Menthol, Topical Analgesic, (BIOFREEZE) 4 % GEL Apply 1 application topically See admin instructions. Apply topically to left heel every morning and at bedtime.   Yes [provider]  Nutritional Supplements (NUTRITIONAL SUPPLEMENT PO) Take 1 each by mouth 3 (three) times daily. Magic Cup    Yes [provider]  PRESCRIPTION MEDICATION 0.25 mg See admin instructions. Lorazepam gel 0.25 mg - apply topically daily as needed for increased agitation   Yes [provider]  simvastatin (ZOCOR) 80 MG tablet Take 1 tablet (80 mg total) by mouth at bedtime. 11/09/13  Yes Croitoru, Mihai, MD  Sodium Phosphates (FLEET ENEMA RE) Place 1 enema rectally daily as needed (constipation not relieved by Bisacodyl suppository).   Yes [provider]  vitamin B-12 (CYANOCOBALAMIN) 1000 MCG tablet Take 1,000 mcg by mouth daily.   Yes [provider]    Physical Exam: Vitals:   06/02/2018 1954 05/29/2018 2001 06/04/2018 2020 05/14/2018 2100  BP:  (!) 63/44 (!) 81/46 (!) 89/55  Pulse: (!) 53  (!) 47 63  Resp: 18  (!) 29 19  SpO2: 95%   96%     Constitutional: Cachectic, tachypneic, no pallor, no diaphoresis Eyes: PERTLA, lids and conjunctivae normal ENMT: Mucous membranes are moist. Posterior pharynx clear of any exudate or lesions.   Neck: normal, supple, no masses, no thyromegaly Respiratory: Tachypnea, increased WOB, coarse rhonchi bilaterally. No pallor or cyanosis.  Cardiovascular: S1 & S2 heard, regular rate and rhythm. No extremity edema.   Abdomen: No distension, no tenderness, soft. Bowel sounds normal.  Musculoskeletal: no clubbing / cyanosis. No joint deformity upper and lower extremities.    Skin: no significant rashes, lesions, ulcers. Poor turgor. Neurologic: No gross facial asymmetry. Sensation intact. Moving all extremities.  Psychiatric: Alert and disoriented. Combative.  Labs on Admission: I have personally reviewed following labs and imaging studies  CBC: Recent Labs  Lab 05/29/18 2018/06/16 1935  WBC 9.8 5.7  NEUTROABS 9 5.0  HGB 11.3* 13.1  HCT 35* 43.8  MCV  --  94.4  PLT 225 282   Basic Metabolic Panel: Recent Labs  Lab 05/29/18 06-16-2018 1935  NA 150* 152*  K 3.8 3.8  CL  --  117*  CO2  --  20*  GLUCOSE  --  154*  BUN 50* 69*  CREATININE 1.4* 2.36*  CALCIUM  --  8.4*   GFR: Estimated Creatinine Clearance: 11.7 mL/min (A) (by C-G formula based on SCr of 2.36 mg/dL (H)). Liver Function Tests: Recent Labs  Lab 06/16/18 1935  AST 32  ALT 26  ALKPHOS 85  BILITOT 0.9  PROT 6.2*  ALBUMIN 2.4*   No results for input(s): LIPASE, AMYLASE in the last 168 hours. No results for input(s): AMMONIA in the last 168 hours. Coagulation Profile: Recent Labs  Lab 06/16/18 2000   INR 1.23   Cardiac Enzymes: No results for input(s): CKTOTAL, CKMB, CKMBINDEX, TROPONINI in the last 168 hours. BNP (last 3 results) No results for input(s): PROBNP in the last 8760 hours. HbA1C: No results for input(s): HGBA1C in the last 72 hours. CBG: No results for input(s): GLUCAP in the last 168 hours. Lipid Profile: No results for input(s): CHOL, HDL, LDLCALC, TRIG, CHOLHDL, LDLDIRECT in the last 72 hours. Thyroid Function Tests: No results for input(s): TSH, T4TOTAL, FREET4, T3FREE, THYROIDAB in the last 72 hours. Anemia Panel: No results for input(s): VITAMINB12, FOLATE, FERRITIN, TIBC, IRON, RETICCTPCT in the last 72 hours. Urine analysis:    Component Value Date/Time   COLORURINE YELLOW 02/07/2018 0057   APPEARANCEUR CLEAR 02/07/2018 0057   LABSPEC 1.020 02/07/2018 0057   PHURINE 5.0 02/07/2018 0057   GLUCOSEU NEGATIVE 02/07/2018 0057   HGBUR MODERATE (A) 02/07/2018 0057   BILIRUBINUR NEGATIVE 02/07/2018 0057   KETONESUR 5 (A) 02/07/2018 0057   PROTEINUR 30 (A) 02/07/2018 0057   UROBILINOGEN 1.0 06/27/2014 1514   NITRITE NEGATIVE 02/07/2018 0057   LEUKOCYTESUR TRACE (A) 02/07/2018 0057   Sepsis Labs: @LABRCNTIP (procalcitonin:4,lacticidven:4) )No results found for this or any previous visit (from the past 240 hour(s)).   Radiological Exams on Admission: Dg Chest Port 1 View  Result Date: 06/16/18 CLINICAL DATA:  Hypoxia. Altered mental status. EXAM: PORTABLE CHEST 1 VIEW COMPARISON:  02/07/2018 and 07/03/2013. FINDINGS: Stable borderline enlarged cardiac silhouette and tortuous and calcified thoracic aorta. Stable right diaphragmatic eventration. Mild airspace opacity at both lung bases. Diffuse osteopenia. Stable midthoracic vertebral compression deformity. IMPRESSION: Mild bibasilar pneumonia or patchy atelectasis. Electronically Signed   By: Beckie Salts M.D.   On: 2018/06/16 19:53    EKG: Independently reviewed. Junctional rhythm, non-specific IVCD.    Assessment/Plan  1. SIRS; hypotension  - Presents from SNF with lethargy and respiratory distress, found to be saturating mid-70's with EMS and hypotensive with SBP 63 on arrival to ED  - There is no fever documented and no leukocytosis   - Elevated lactate and hypotension could be explained by marked hypovolemia, but hypoxia raises concern for recurrent aspiration and/or PNA  - Blood culture collected in ED, 30 cc/kg NS bolused, and empiric vancomycin and cefepime started  - Give additional bolus for recurrent hypotension, check cortisol level and give 100 mg IV hydrocortisone, continue empiric antibiotics for suspected aspiration PNA, trend lactate and clinical course, follow cultures    2. Acute respiratory  failure with hypoxia  - Presents from SNF with lethargy and respiratory distress  - He has dysphagia, recurrent aspiration, and was treated for HCAP last month  - Saturations in mid-70's with EMS and he arrives on NRB  - Currently requiring 8 Lpm supplemental O2 to maintain sat low 90's  - Aspiration is suspected, possibly pneumonia as discussed above  - Continue empiric antibiotics, dysphagia diet, supportive care   3. Acute kidney injury  - SCr is 2.36 on admission  - Likely a prerenal azotemia given marked hypovolemia and hypotension; ATN possible  - Treated with 30 cc/kg NS in ED and continued on IVF  - Check urine chemistries and renal US, continue IVF, repeat chem panel in am    4. Hypernatremia  - Serum sodium is 152 on admission in setting of dehydration  - He has been receiving IVF at the SNF, still appears markedly hypovolemic in ED   - Treated with 30 cc/kg NS in ED, will give another 750 cc for persistent hypotension, then plan to start 1/2 NS and repeat chem panel in am    5. Dementia  - Patient is disoriented and combative; this is his baseline per report of SNF personnel  - Continue Namenda and prn Ativan    6. CAD - No acute ischemic features noted on EKG   - Continue Lipitor as tolerated    7. Atrial fibrillation  - In a junctional rhythm in ED  - CHADS-VASc is at least 23 (age x2, CAD)  - Not anticoagulated d/t fall-risk    DVT prophylaxis: sq heparin  Code Status: DNR  Family Communication: Emergency contact, Alecia Lemming, updated by phone; she is aware of pt's critical condition and chance he may not survive this hospitalization Consults called: None Admission status: Inpatient     Briscoe Deutscher, MD [facility] Pager [phone]  If 7PM-7AM, please contact night-coverage [email or web address] Password [facility]  06/06/2018, 9:38 PM

## 2018-05-31 NOTE — ED Notes (Signed)
Patient has visitors at the bedside, wanted to speak to MD

## 2018-05-31 NOTE — ED Triage Notes (Signed)
Per EMS:  Called out due to patient having low SpO2 saturations and altered mental status/almost unresponsive.  Patient bagged by fire upon arrival.  Patient noted to be 75-85% on RA, placed on 15L NRB at this time, Sats 75%.  Recent diagnosis of HCAP.  PERRL intact.  Told yesterday by routine blood work "BNP" and "dehydration" were an issue.

## 2018-06-01 ENCOUNTER — Other Ambulatory Visit: Payer: Self-pay

## 2018-06-01 DIAGNOSIS — N179 Acute kidney failure, unspecified: Secondary | ICD-10-CM

## 2018-06-01 DIAGNOSIS — J9601 Acute respiratory failure with hypoxia: Secondary | ICD-10-CM

## 2018-06-01 DIAGNOSIS — G308 Other Alzheimer's disease: Secondary | ICD-10-CM

## 2018-06-01 DIAGNOSIS — F0281 Dementia in other diseases classified elsewhere with behavioral disturbance: Secondary | ICD-10-CM

## 2018-06-01 LAB — BASIC METABOLIC PANEL
ANION GAP: 14 (ref 5–15)
BUN: 71 mg/dL — AB (ref 8–23)
CHLORIDE: 122 mmol/L — AB (ref 98–111)
CO2: 17 mmol/L — ABNORMAL LOW (ref 22–32)
Calcium: 7.2 mg/dL — ABNORMAL LOW (ref 8.9–10.3)
Creatinine, Ser: 2.64 mg/dL — ABNORMAL HIGH (ref 0.61–1.24)
GFR, EST AFRICAN AMERICAN: 22 mL/min — AB (ref >60)
GFR, EST NON AFRICAN AMERICAN: 19 mL/min — AB (ref >60)
Glucose, Bld: 70 mg/dL (ref 70–99)
POTASSIUM: 4.6 mmol/L (ref 3.5–5.1)
SODIUM: 153 mmol/L — AB (ref 135–145)

## 2018-06-01 LAB — LACTIC ACID, PLASMA
Lactic Acid, Venous: 4.8 mmol/L (ref 0.5–1.9)
Lactic Acid, Venous: 3.7 mmol/L (ref 0.5–1.9)

## 2018-06-01 LAB — BLOOD GAS, ARTERIAL
Acid-base deficit: 6.6 mmol/L — ABNORMAL HIGH (ref 0.0–2.0)
Bicarbonate: 17.2 mmol/L — ABNORMAL LOW (ref 20.0–28.0)
Drawn by: 35043
O2 Content: 5 L/min
O2 Saturation: 86.5 %
Patient temperature: 97
pCO2 arterial: 26.6 mmHg — ABNORMAL LOW (ref 32.0–48.0)
pH, Arterial: 7.421 (ref 7.350–7.450)
pO2, Arterial: 53.8 mmHg — ABNORMAL LOW (ref 83.0–108.0)

## 2018-06-01 LAB — PROCALCITONIN: PROCALCITONIN: 16.76 ng/mL

## 2018-06-01 LAB — MRSA PCR SCREENING: MRSA BY PCR: NEGATIVE

## 2018-06-01 MED ORDER — SODIUM CHLORIDE 0.9 % IV BOLUS
1000.0000 mL | Freq: Once | INTRAVENOUS | Status: AC
  Administered 2018-06-01: 1000 mL via INTRAVENOUS

## 2018-06-01 MED ORDER — MORPHINE SULFATE (PF) 2 MG/ML IV SOLN
1.0000 mg | INTRAVENOUS | Status: DC | PRN
  Administered 2018-06-01: 2 mg via INTRAVENOUS
  Filled 2018-06-01: qty 1

## 2018-06-01 MED ORDER — POTASSIUM CHLORIDE IN NACL 20-0.45 MEQ/L-% IV SOLN
INTRAVENOUS | Status: DC
  Administered 2018-06-01: 01:00:00 via INTRAVENOUS
  Filled 2018-06-01: qty 1000

## 2018-06-01 MED ORDER — IPRATROPIUM-ALBUTEROL 0.5-2.5 (3) MG/3ML IN SOLN
3.0000 mL | Freq: Three times a day (TID) | RESPIRATORY_TRACT | Status: DC
  Administered 2018-06-01: 3 mL via RESPIRATORY_TRACT
  Filled 2018-06-01 (×2): qty 3

## 2018-06-01 MED ORDER — GLYCOPYRROLATE 0.2 MG/ML IJ SOLN
0.1000 mg | Freq: Once | INTRAMUSCULAR | Status: AC
  Administered 2018-06-01: 0.1 mg via INTRAVENOUS
  Filled 2018-06-01: qty 1

## 2018-06-05 LAB — CULTURE, BLOOD (ROUTINE X 2)
CULTURE: NO GROWTH
SPECIAL REQUESTS: ADEQUATE

## 2018-06-10 NOTE — Progress Notes (Signed)
PROGRESS NOTE    Devin Clark  ZOX:[id number] DOB: [birth year] DOA: 09-05-18 PCP: Pecola LawlessHopper, Jontavious F, MD  Brief Narrative: Devin Clark is a 84 y.o. male with medical history significant for dementia, dysphasia with recurrent aspiration, hypothyroidism, coronary artery disease, and failure to thrive, now presenting to the emergency department for evaluation of lethargy and respiratory distress.  At his baseline, patient is reportedly disoriented, but active and frequently combative.  He has been coughing a lot for the past couple days, has been treated with breathing treatments and IV fluids at the nursing facility recently, but was noted to be very lethargic today in addition to worsening respiratory distress.  He was treated for HCAP last month   Assessment & Plan:    Severe sepsis with shock -Secondary to aspiration pneumonia which seems to be recurrent from long-standing history of dysphagia/dementia -no significant improvement despite 4 L of fluid bolus for resuscitation, antibiotics and supportive care -Now remains hypotensive with respiratory distress, encephalopathic -he is a ward of the state, DNR, we have made numerous attempts to contact patient's legal guardian Lebron ConnersLanae Anderson and her supervisor Yolanda MangesAlison CLark, I made 3attempts this morning, in addition to multiple attempts last night by NP Stevie Kernharles Bodenheimer, and admitting M.D. Dr.Opyd -he continues to actively decline with worsening hypotension, respiratory distress and hypoxia -We will continue comfort focused care, at morphine and Robinul for work of breathing and history secretions -Anticipate hospital demise -continue to make further attempts at contacting his legal guardians  Acute kidney injury  Advanced dementia  Severe dysphagia  Severe protein calorie malnutrition  CAD/atrial fibrillation  DVT prophylaxis: SCDs Code Status: DNR Family Communication: patient's caregiver is at bedside, he is estranged from  family and numerous attempts made as above to [facility] Disposition Plan: anticipate hospital demise  Consultants:      Procedures:   Antimicrobials:    Subjective: -remains hypotensive, obtunded, and respiratory distress  Objective: Vitals:   05/11/2018 0531 05/28/2018 0600 06/05/2018 0737 05/22/2018 0813  BP: (!) 131/54 (!) 144/94 (!) 73/46   Pulse: 95 66 60   Resp: 17 (!) 24 (!) 22   Temp:   97.8 F (36.6 C)   TempSrc:   Axillary   SpO2:  98% 96% 97%  Weight:      Height:        Intake/Output Summary (Last 24 hours) at 05/17/2018 1108 Last data filed at 05/21/2018 0449 Gross per 24 hour  Intake 102.95 ml  Output 0 ml  Net 102.95 ml   Filed Weights   05/30/2018 0321  Weight: 43.8 kg    Examination:  General exam: extremely debilitated frail elderly male, eyes open, poorly responsive, mumbles, in mild respiratory distress Respiratory system: rhonchi at both bases Cardiovascular system: S1-S2/tachycardic Gastrointestinal system: abdomen is soft, diffusely tender, bowel sounds diminished Central nervous system: confused, obtunded Extremities: no edema, severely cachectic Psychiatry: unable to assess    Data Reviewed:   CBC: Recent Labs  Lab 05/29/18 05-09-18 1935  WBC 9.8 5.7  NEUTROABS 9 5.0  HGB 11.3* 13.1  HCT 35* 43.8  MCV  --  94.4  PLT 225 282   Basic Metabolic Panel: Recent Labs  Lab 05/29/18 05-09-18 1935 05/16/2018 0558  NA 150* 152* 153*  K 3.8 3.8 4.6  CL  --  117* 122*  CO2  --  20* 17*  GLUCOSE  --  154* 70  BUN 50* 69* 71*  CREATININE 1.4* 2.36* 2.64*  CALCIUM  --  8.4* 7.2*   GFR: Estimated Creatinine Clearance: 10.4 mL/min (A) (by C-G formula based on SCr of 2.64 mg/dL (H)). Liver Function Tests: Recent Labs  Lab 06-16-2018 1935  AST 32  ALT 26  ALKPHOS 85  BILITOT 0.9  PROT 6.2*  ALBUMIN 2.4*   No results for input(s): LIPASE, AMYLASE in the last 168 hours. No results for input(s):  AMMONIA in the last 168 hours. Coagulation Profile: Recent Labs  Lab 2018-06-16 2000  INR 1.23   Cardiac Enzymes: No results for input(s): CKTOTAL, CKMB, CKMBINDEX, TROPONINI in the last 168 hours. BNP (last 3 results) No results for input(s): PROBNP in the last 8760 hours. HbA1C: No results for input(s): HGBA1C in the last 72 hours. CBG: No results for input(s): GLUCAP in the last 168 hours. Lipid Profile: No results for input(s): CHOL, HDL, LDLCALC, TRIG, CHOLHDL, LDLDIRECT in the last 72 hours. Thyroid Function Tests: No results for input(s): TSH, T4TOTAL, FREET4, T3FREE, THYROIDAB in the last 72 hours. Anemia Panel: No results for input(s): VITAMINB12, FOLATE, FERRITIN, TIBC, IRON, RETICCTPCT in the last 72 hours. Urine analysis:    Component Value Date/Time   COLORURINE YELLOW 02/07/2018 0057   APPEARANCEUR CLEAR 02/07/2018 0057   LABSPEC 1.020 02/07/2018 0057   PHURINE 5.0 02/07/2018 0057   GLUCOSEU NEGATIVE 02/07/2018 0057   HGBUR MODERATE (A) 02/07/2018 0057   BILIRUBINUR NEGATIVE 02/07/2018 0057   KETONESUR 5 (A) 02/07/2018 0057   PROTEINUR 30 (A) 02/07/2018 0057   UROBILINOGEN 1.0 06/27/2014 1514   NITRITE NEGATIVE 02/07/2018 0057   LEUKOCYTESUR TRACE (A) 02/07/2018 0057   Sepsis Labs: @LABRCNTIP (procalcitonin:4,lacticidven:4)  ) Recent Results (from the past 240 hour(s))  Culture, blood (routine x 2)     Status: None (Preliminary result)   Collection Time: 2018-06-16  7:34 PM  Result Value Ref Range Status   Specimen Description BLOOD RIGHT ANTECUBITAL  Final   Special Requests   Final    BOTTLES DRAWN AEROBIC AND ANAEROBIC Blood Culture adequate volume   Culture   Final    NO GROWTH < 24 HOURS Performed at [facility], [address], [place], [place] [address]    Report Status PENDING  Incomplete  MRSA PCR Screening     Status: None   Collection Time: 05/19/2018  3:34 AM  Result Value Ref Range Status   MRSA by PCR NEGATIVE NEGATIVE Final     Comment:        The GeneXpert MRSA Assay (FDA approved for NASAL specimens only), is one component of a comprehensive MRSA colonization surveillance program. It is not intended to diagnose MRSA infection nor to guide or monitor treatment for MRSA infections. Performed at [facility], [address] [place], [place] [address]          Radiology Studies: US Renal  Result Date: 06/16/18 CLINICAL DATA:  84 year old male with acute renal insufficiency. History of hypertension. EXAM: RENAL / URINARY TRACT ULTRASOUND COMPLETE COMPARISON:  None. FINDINGS: Right Kidney: Length: 8.9 cm. The right kidney is suboptimally visualized. There is no hydronephrosis or shadowing stone. There is a 1.4 x 1.1 x 1.2 cm cyst. Left Kidney: Length: 10.1 cm. No hydronephrosis or shadowing stone. Multiple cysts with the largest measuring 2.6 x 2.9 x 1.9 cm which contains a thin septation, Bosniak II. Bladder: Appears normal for degree of bladder distention. There is a small right pleural effusion and trace ascites. IMPRESSION: 1. No hydronephrosis or shadowing stone. 2. Bilateral renal cysts.  3. Small right pleural effusion and ascites. Electronically Signed   By: Elgie Collard M.D.   On: 06/20/2018 22:56   Dg Chest Port 1 View  Result Date: 06/20/2018 CLINICAL DATA:  Hypoxia. Altered mental status. EXAM: PORTABLE CHEST 1 VIEW COMPARISON:  02/07/2018 and 07/03/2013. FINDINGS: Stable borderline enlarged cardiac silhouette and tortuous and calcified thoracic aorta. Stable right diaphragmatic eventration. Mild airspace opacity at both lung bases. Diffuse osteopenia. Stable midthoracic vertebral compression deformity. IMPRESSION: Mild bibasilar pneumonia or patchy atelectasis. Electronically Signed   By: Beckie Salts M.D.   On: 06-20-18 19:53        Scheduled Meds: . atorvastatin  40 mg Oral q1800  . heparin  5,000 Units Subcutaneous Q8H  . ipratropium-albuterol  3 mL Nebulization Q8H  .  levothyroxine  100 mcg Oral QAC breakfast  . memantine  10 mg Oral BID  . sodium chloride flush  3 mL Intravenous Q12H  . sodium chloride flush  3 mL Intravenous Q12H   Continuous Infusions: . sodium chloride    . cefTRIAXone (ROCEPHIN)  IV    . [START ON 06/02/2018] vancomycin       LOS: 1 day    Time spent:    Zannie Cove, MD [facility] Page via [email or web address], password [email or web address] After 7PM please contact night-coverage  06/09/2018, 11:08 AM

## 2018-06-10 NOTE — Progress Notes (Signed)
RT went to do the patient's neb treatment. The patient did not appear to be breathing at that time. The patient's RN and the patient's MD was brought in to assess the patient. The patient is currently a DNR.

## 2018-06-10 NOTE — Progress Notes (Addendum)
CRITICAL VALUE ALERT  Critical Value:  Lactic acid 3.7   Date & Time Notied:  0743 9/22  Provider Notified: Dr. Mitchel HonourP. Joseph   Orders Received/Actions taken: Will continue to monitor pt and repeated attempts to contact legal guardians.

## 2018-06-10 NOTE — Progress Notes (Signed)
Legal Guardian information from [facility]:  Devin ConnersLanae Clark  [phone]

## 2018-06-10 NOTE — Progress Notes (Signed)
   06/09/2018 0544  Clinical Encounter Type  Visited With Other (Comment);Patient (two ladies caring for patient 3 concecutive days)  Visit Type Initial  Referral From Nurse  Spiritual Encounters  Spiritual Needs Prayer  Stress Factors  Patient Stress Factors Loss of control  Family Stress Factors Family relationships   Responded to page. Nurse stated that care providers have cared for PT and have lost power of attorney privileges for PT and stated that these providers needs spiritual care. PT was alert and confused. I offered spiritual care for the two ladies and PT with ministry of presence, words of encouragement and prayed. Chaplain available upon request.   Chaplain Orest DikesAbel Walterine Amodei

## 2018-06-10 NOTE — Progress Notes (Signed)
Pts status continues to decline and he continues to be hypotensive. Ordered a 1L NS Bolus. Due to pts poor prognosis I spoke with the emergency contact listed about pts condition. Pt is a ward of the state and a full DNR. Legal guardian was listed as Lebron ConnersLanae Anderson with [employer] [facility] [employer] [place] [employer]. Several attempts were made to reach Mrs. Anderson by phone which were unsuccessful and several messages were left. Also made several attempts to reach her supervisor Artis DelayAllyson Clark by phone which were unsuccessful. A call was also put in to the on call social worker which has not been returned. Given the pts poor prognosis we will continue the current level of care and continue to try and contact the appropriate healthcare decision maker.  Stevie Kernharles Maritta Kief AGPCNP-BC, AGNP-C [facility] Pager [phone]

## 2018-06-10 DEATH — deceased

## 2018-07-11 NOTE — Discharge Summary (Signed)
Death Summary  [name]:[id number] DOB: [birth year] DOA: 06-30-18  PCP: Pecola Lawless, MD  Admit date: 2018/06/30 Date of Death: 01-Jul-2018  Final Diagnoses:   Septic shock   Acute hypoxic resp failure   Severe dysphagia   Advanced Dementia   Coronary artery disease   Alzheimer's dementia (HCC)   Atrial fibrillation (HCC)   Acute respiratory failure with hypoxia (HCC)   Hypernatremia   Dysphagia   Hypotension   Acute renal failure (ARF) (HCC)    History of present illness:   Devin Clark a 84 y.o.malewith medical history significant fordementia, dysphasia with recurrent aspiration, hypothyroidism, coronary artery disease, and failure to thrive, now presenting to the emergency department for evaluation of lethargy and respiratory distress. At his baseline, patient is reportedly disoriented, but active and frequently combative. He has been coughing a lot for the past couple days, has been treated with breathing treatments and IV fluids at the nursing facility recently, but was noted to be very lethargic in addition to worsening respiratory distress, sent to ED  Hospital Course:     Severe sepsis with shock -Mr.Blakesley was admitted with septic shock and resp failure secondary to aspiration pneumonia which seems to be recurrent from long-standing history of dysphagia/dementia -no significant improvement despite 4 L of fluid bolus for resuscitation, antibiotics and supportive care -He remained hypotensive with respiratory distress, encephalopathic, minimal urine output etc -he was a ward of the state previously , DNR, we made numerous attempts to contact patient's legal guardian Lebron Conners and her supervisor Yolanda Manges, I made 3attempts this morning, in addition to multiple attempts last night by NP Stevie Kern, and admitting M.D. Dr.Opyd -he continued to actively decline with worsening hypotension, respiratory distress and hypoxia. -Finally got a  call back from DSS stating that his son was made PoA by the courts just this past FRiday, I was able to call and contact his son, we discussed critical state of pts health, we discussed comfort care which was agreeable to his son. -pt finally expired on 07/02/2023 in the afternoon   Acute kidney injury  Advanced dementia  Severe dysphagia  Severe protein calorie malnutrition  CAD/atrial fibrillation   Time:  Signed:  Zannie Cove  [facility] 06/11/2018, 9:14 PM

## 2020-02-23 IMAGING — CT CT HEAD W/O CM
3 series · 14 of 47 positions shown, 16 images · non-contrast
Comparison: 02/13/2018

CLINICAL DATA: Fall, head trauma

EXAM:
CT HEAD WITHOUT CONTRAST
TECHNIQUE: Contiguous axial images were obtained from the base of the skull
through the vertex without intravenous contrast.

[Series 2: head wo · axial · 0.47mm/px · z∈[+1619,+1749]mm · 8 of 32 slices shown, 10 images]
[im 3/32  brain]
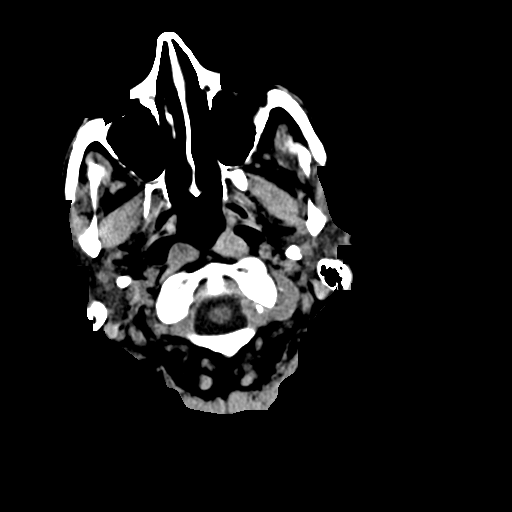
[im 3/32  bone]
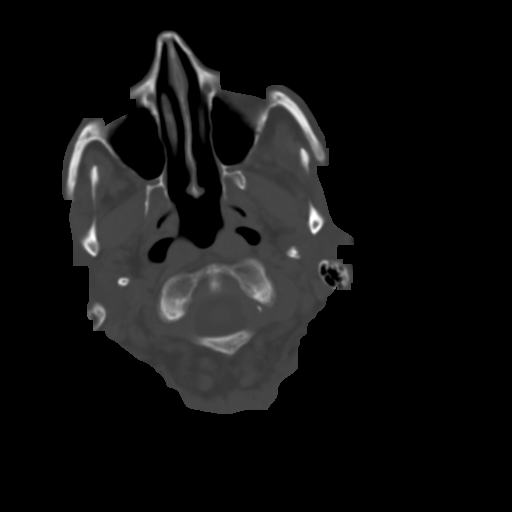
[im 7/32  brain]
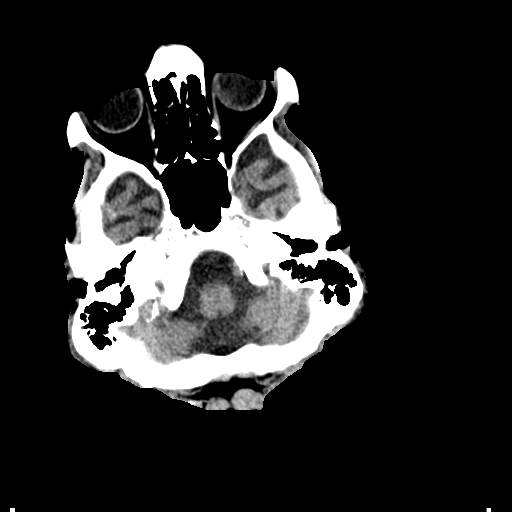
[im 10/32  brain]
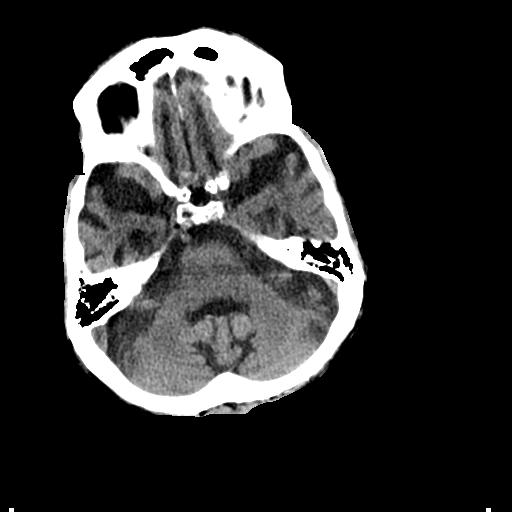
[im 14/32  brain]
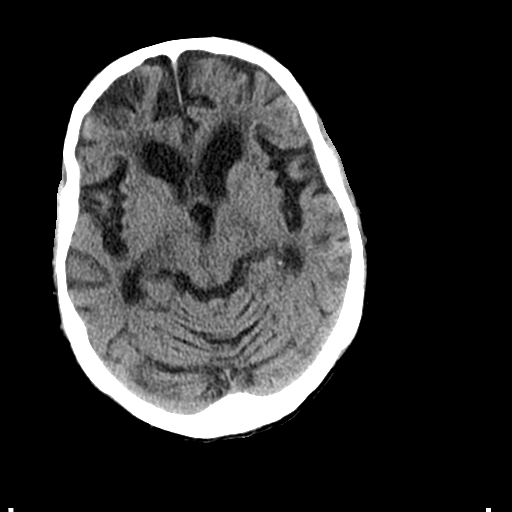
[im 18/32  brain]
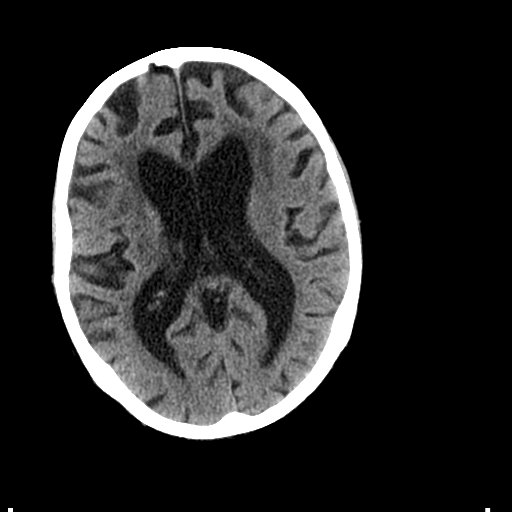
[im 18/32  bone]
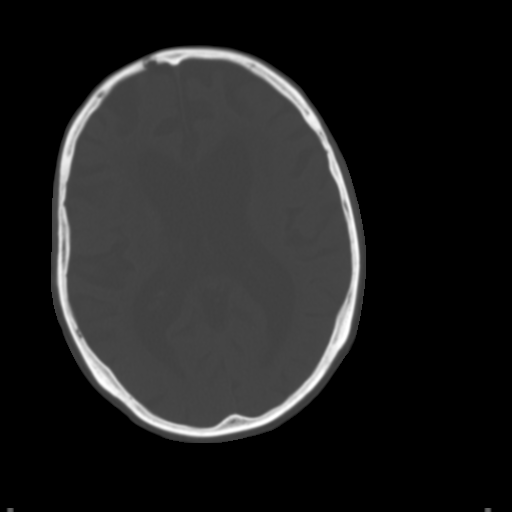
[im 22/32  brain]
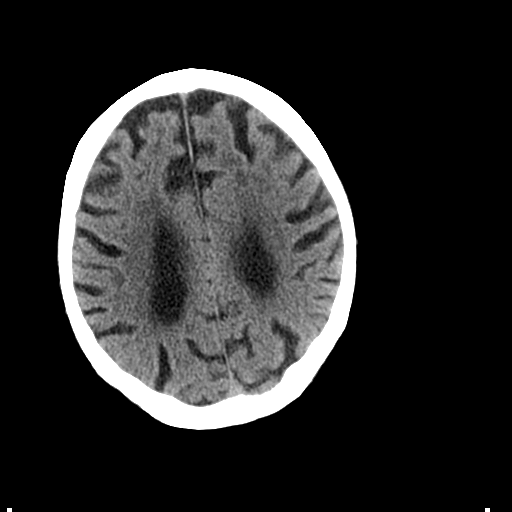
[im 25/32  brain]
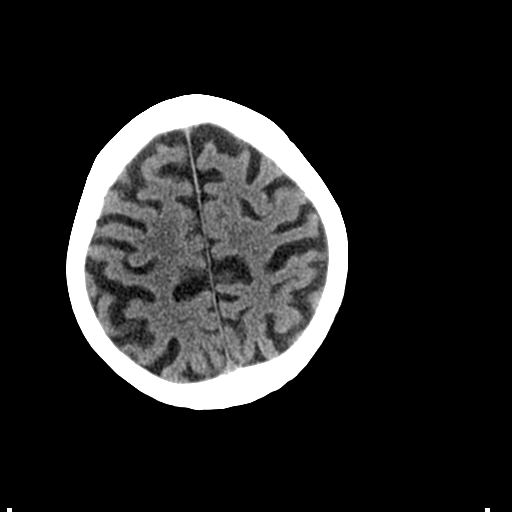
[im 29/32  brain]
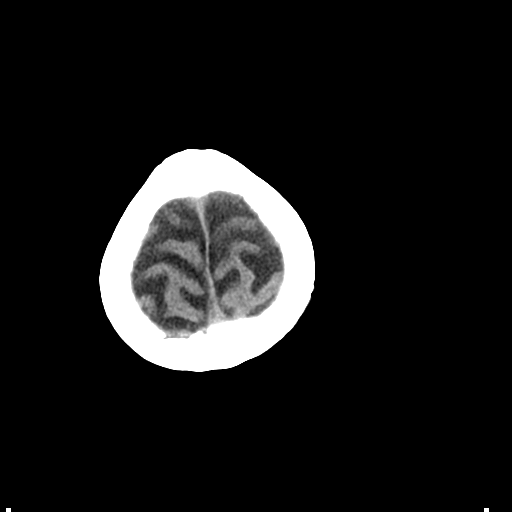

[Series 5: coronal soft tissue · coronal · 0.31mm/px · 3 of 78 slices shown]
[im 26/78  brain]
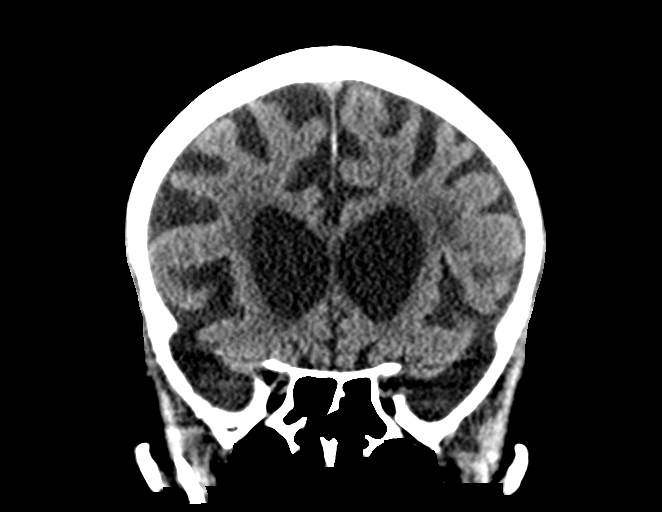
[im 35/78  brain]
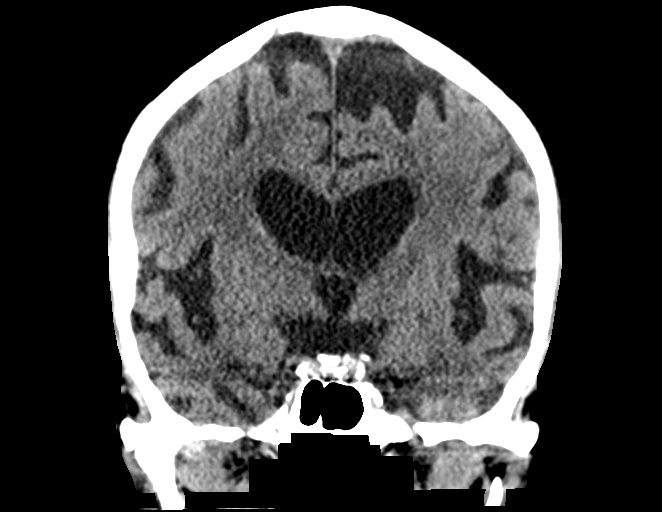
[im 43/78  brain]
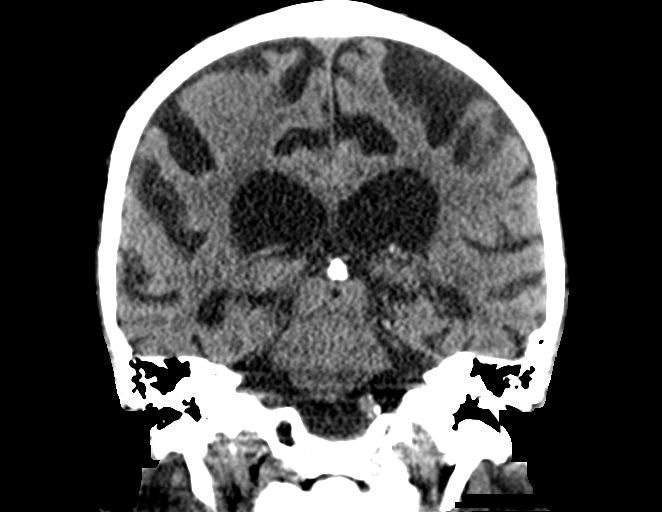

[Series 6: sagittal soft tissue · sagittal · 0.31mm/px · 3 of 71 slices shown]
[im 24/71  brain]
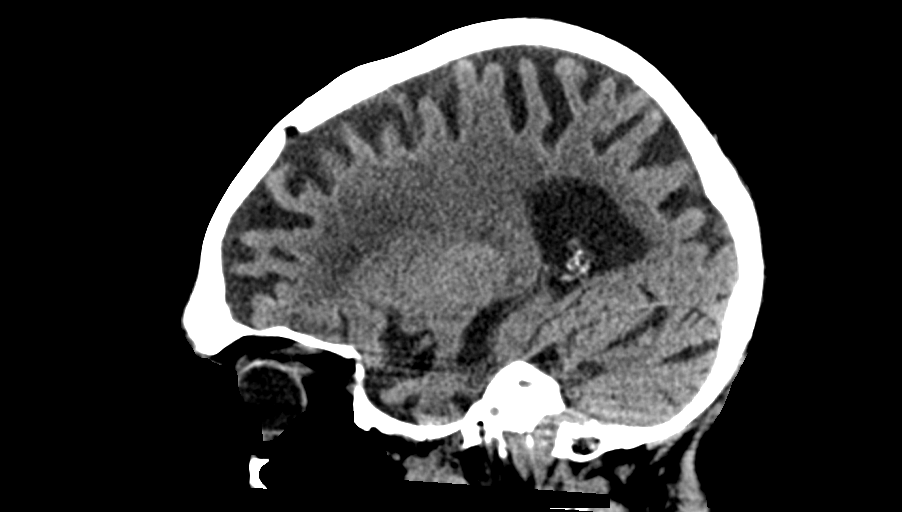
[im 36/71  brain]
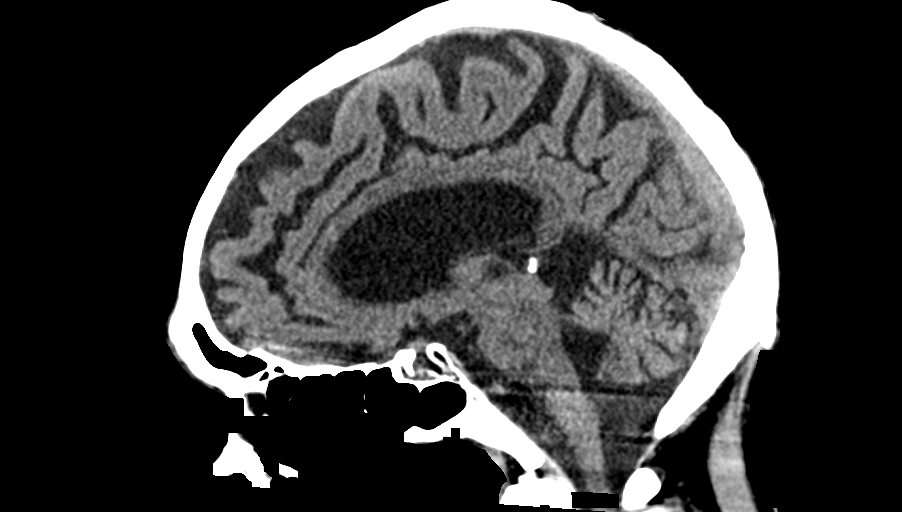
[im 47/71  brain]
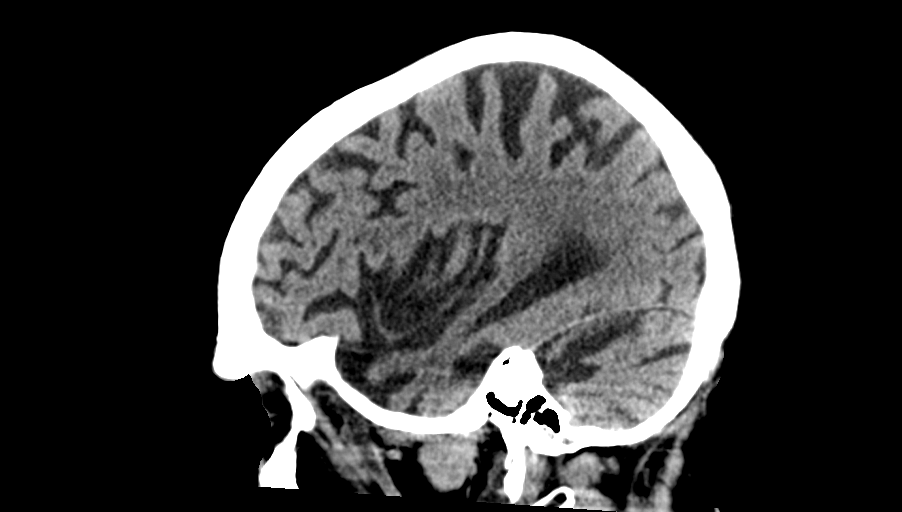

[14 of 47 positions shown; findings below may reference images not displayed]

FINDINGS: Brain: Advanced atrophy and chronic small vessel disease throughout
the deep white matter. Associated ventriculomegaly. No acute
intracranial abnormality. Specifically, no hemorrhage,
hydrocephalus, mass lesion, acute infarction, or significant
intracranial injury.

Vascular: No hyperdense vessel or unexpected calcification.

Skull: No acute calvarial abnormality.

Sinuses/Orbits: Visualized paranasal sinuses and mastoids clear.
Orbital soft tissues unremarkable.

Other: None
IMPRESSION: No acute intracranial abnormality.

Atrophy, chronic microvascular disease.
